# Patient Record
Sex: Male | Born: 1935
Health system: Southern US, Community
[De-identification: ages and names within clinical notes are randomized; demographics above are authoritative.]

## PROBLEM LIST (undated history)

## (undated) DIAGNOSIS — G4700 Insomnia, unspecified: Secondary | ICD-10-CM

## (undated) DIAGNOSIS — G4733 Obstructive sleep apnea (adult) (pediatric): Secondary | ICD-10-CM

## (undated) DIAGNOSIS — Z7901 Long term (current) use of anticoagulants: Secondary | ICD-10-CM

## (undated) DIAGNOSIS — J189 Pneumonia, unspecified organism: Secondary | ICD-10-CM

## (undated) DIAGNOSIS — K635 Polyp of colon: Secondary | ICD-10-CM

## (undated) DIAGNOSIS — I10 Essential (primary) hypertension: Secondary | ICD-10-CM

## (undated) DIAGNOSIS — K649 Unspecified hemorrhoids: Secondary | ICD-10-CM

## (undated) DIAGNOSIS — E785 Hyperlipidemia, unspecified: Secondary | ICD-10-CM

## (undated) DIAGNOSIS — R0600 Dyspnea, unspecified: Secondary | ICD-10-CM

## (undated) DIAGNOSIS — I482 Chronic atrial fibrillation, unspecified: Secondary | ICD-10-CM

## (undated) DIAGNOSIS — J449 Chronic obstructive pulmonary disease, unspecified: Secondary | ICD-10-CM

## (undated) DIAGNOSIS — R609 Edema, unspecified: Secondary | ICD-10-CM

## (undated) DIAGNOSIS — N1832 Chronic kidney disease, stage 3b: Secondary | ICD-10-CM

## (undated) DIAGNOSIS — G4730 Sleep apnea, unspecified: Secondary | ICD-10-CM

## (undated) DIAGNOSIS — K219 Gastro-esophageal reflux disease without esophagitis: Secondary | ICD-10-CM

## (undated) DIAGNOSIS — M109 Gout, unspecified: Secondary | ICD-10-CM

## (undated) DIAGNOSIS — F329 Major depressive disorder, single episode, unspecified: Secondary | ICD-10-CM

## (undated) DIAGNOSIS — F32A Depression, unspecified: Secondary | ICD-10-CM

## (undated) DIAGNOSIS — I495 Sick sinus syndrome: Secondary | ICD-10-CM

## (undated) HISTORY — DX: Gout, unspecified: M10.9

## (undated) HISTORY — DX: Insomnia, unspecified: G47.00

## (undated) HISTORY — DX: Chronic obstructive pulmonary disease, unspecified: J44.9

## (undated) HISTORY — PX: CHOLECYSTECTOMY: SHX55

## (undated) HISTORY — DX: Major depressive disorder, single episode, unspecified: F32.9

## (undated) HISTORY — DX: Polyp of colon: K63.5

## (undated) HISTORY — DX: Hyperlipidemia, unspecified: E78.5

## (undated) HISTORY — DX: Chronic atrial fibrillation, unspecified: I48.20

## (undated) HISTORY — PX: NASAL SINUS SURGERY: SHX719

## (undated) HISTORY — DX: Sick sinus syndrome: I49.5

## (undated) HISTORY — DX: Sleep apnea, unspecified: G47.30

## (undated) HISTORY — PX: COLONOSCOPY: SHX174

## (undated) HISTORY — DX: Depression, unspecified: F32.A

## (undated) HISTORY — PX: CATARACT EXTRACTION, BILATERAL: SHX1313

## (undated) HISTORY — DX: Edema, unspecified: R60.9

## (undated) HISTORY — DX: Essential (primary) hypertension: I10

---

## 2004-01-09 ENCOUNTER — Ambulatory Visit: Payer: Self-pay

## 2004-08-19 ENCOUNTER — Other Ambulatory Visit: Payer: Self-pay

## 2004-08-19 ENCOUNTER — Inpatient Hospital Stay: Payer: Self-pay | Admitting: Cardiology

## 2004-08-20 ENCOUNTER — Other Ambulatory Visit: Payer: Self-pay

## 2004-08-21 HISTORY — PX: CARDIOVERSION: SHX1299

## 2004-12-19 ENCOUNTER — Ambulatory Visit: Payer: Self-pay | Admitting: Internal Medicine

## 2005-01-30 ENCOUNTER — Ambulatory Visit: Payer: Self-pay | Admitting: Family Medicine

## 2005-06-26 ENCOUNTER — Ambulatory Visit: Payer: Self-pay | Admitting: Cardiology

## 2005-07-03 ENCOUNTER — Ambulatory Visit: Payer: Self-pay | Admitting: Otolaryngology

## 2006-10-17 ENCOUNTER — Ambulatory Visit: Payer: Self-pay | Admitting: Family Medicine

## 2006-12-09 ENCOUNTER — Ambulatory Visit: Payer: Self-pay | Admitting: Unknown Physician Specialty

## 2006-12-27 ENCOUNTER — Ambulatory Visit: Payer: Self-pay | Admitting: Unknown Physician Specialty

## 2007-01-02 ENCOUNTER — Ambulatory Visit: Payer: Self-pay | Admitting: Family Medicine

## 2007-04-14 ENCOUNTER — Ambulatory Visit: Payer: Self-pay | Admitting: Urology

## 2007-12-15 IMAGING — CT CT ABD-PELV W/ CM
1 of 3 series · 13 of 32 positions shown, 19 images · IV contrast (agent unspecified)
Comparison: none

REASON FOR EXAM: abd pain  epigastric   dysphagia
COMMENTS:

PROCEDURE:     CT  - CT ABDOMEN / PELVIS  W  - December 09, 2006  [DATE]
RESULT:     Comparison: No available comparison exam.
TECHNIQUE: CT examination of the abdomen and pelvis was performed after
intravenous administration of 85 cc of [id number] nonionic contrast in
addition to oral contrast. Collimation is 8 mm. Delayed postcontrast images
of the abdomen were also obtained.

[Series 2: soft tissue · axial · 0.76mm/px · z∈[+483,+891]mm · 13 of 59 slices shown, 19 images]
[im 4/59  soft-tissue]
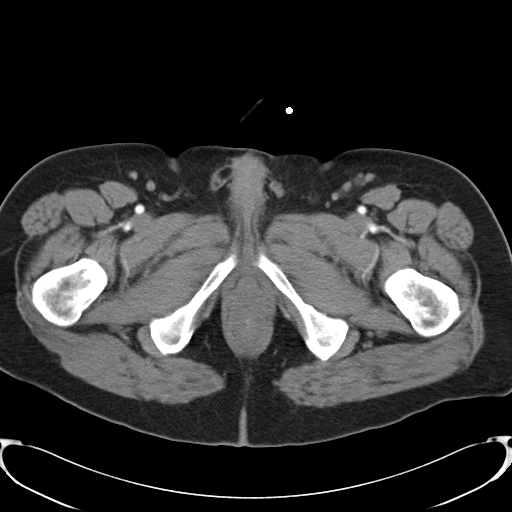
[im 4/59  bone]
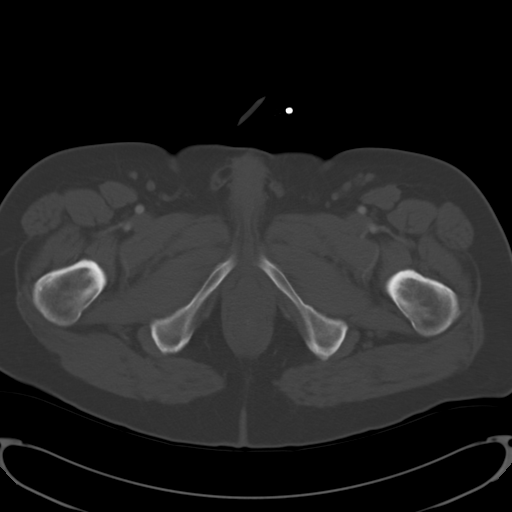
[im 8/59  soft-tissue]
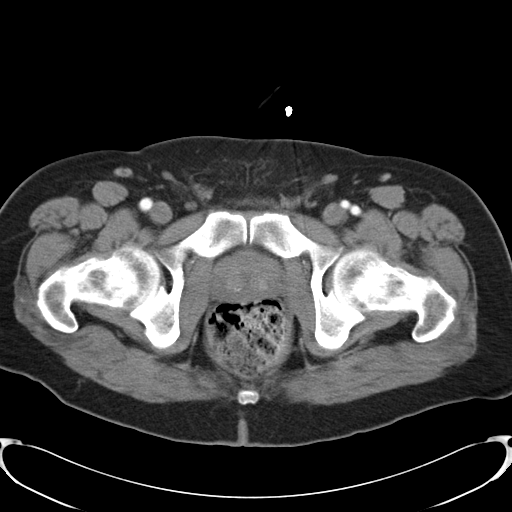
[im 12/59  soft-tissue]
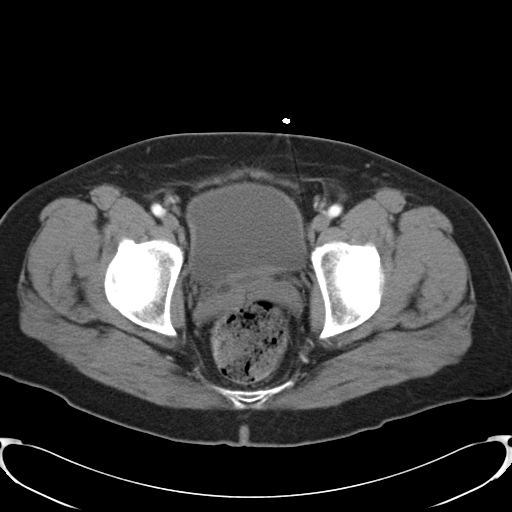
[im 16/59  soft-tissue]
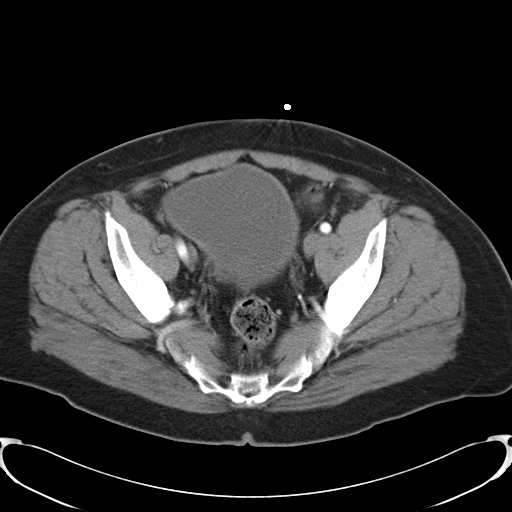
[im 20/59  soft-tissue]
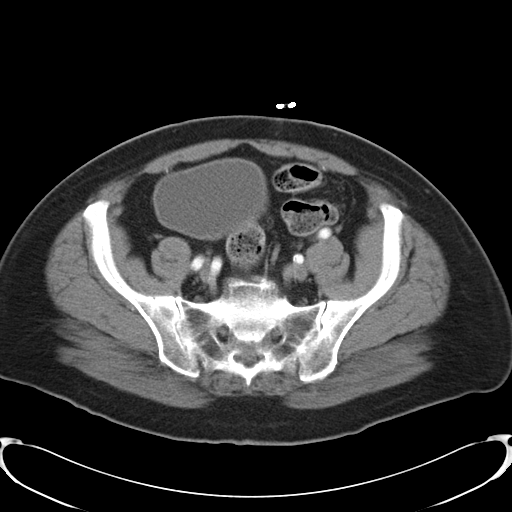
[im 24/59  soft-tissue]
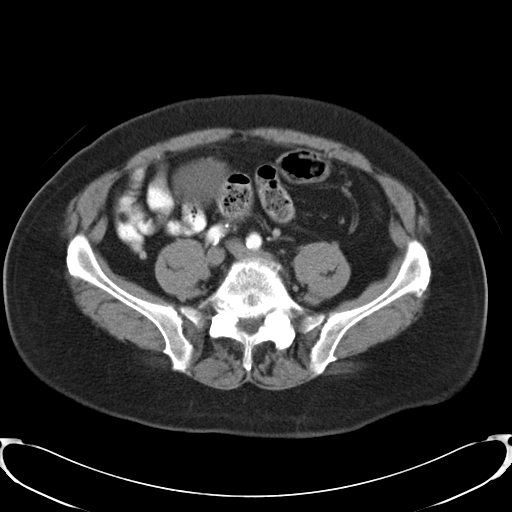
[im 31/59  soft-tissue]
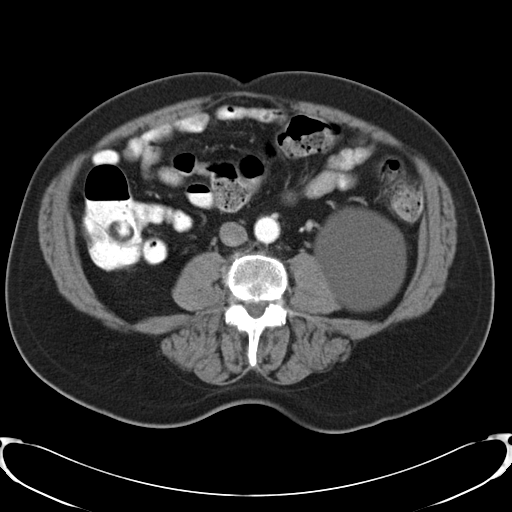
[im 35/59  soft-tissue]
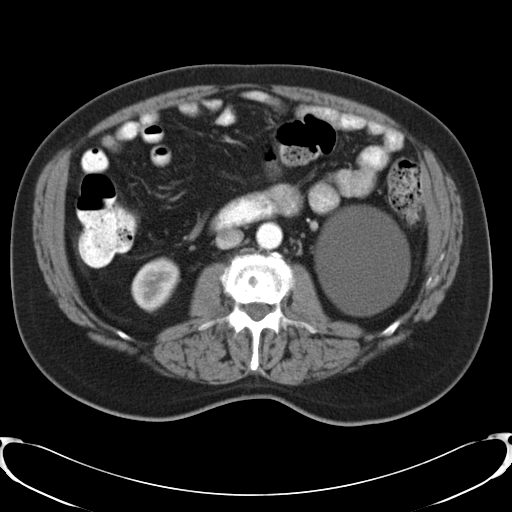
[im 39/59  soft-tissue]
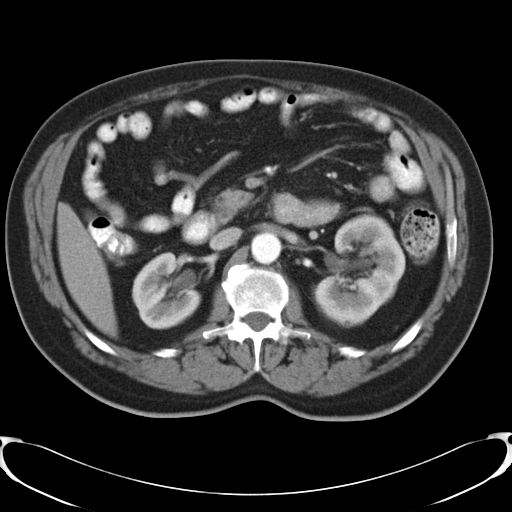
[im 39/59  bone]
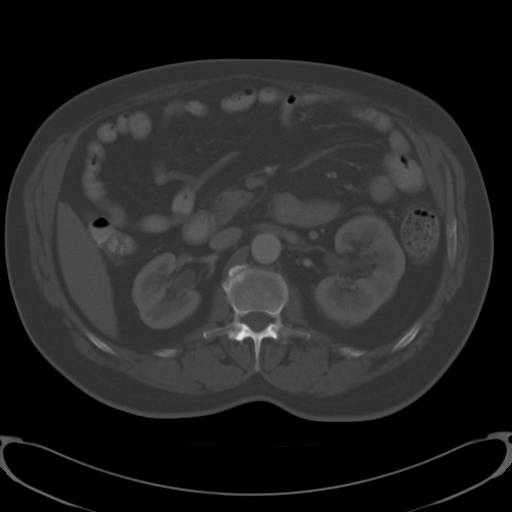
[im 43/59  soft-tissue]
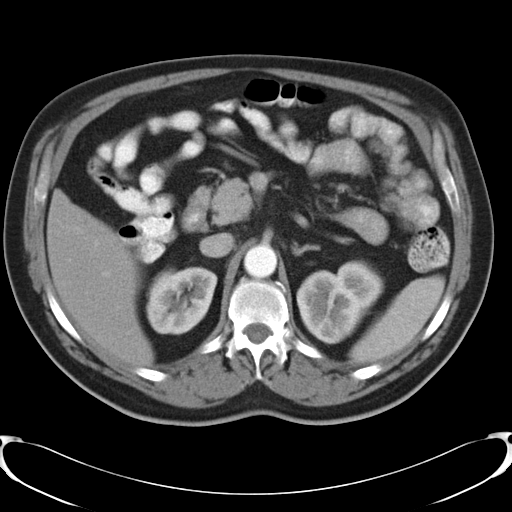
[im 43/59  lung]
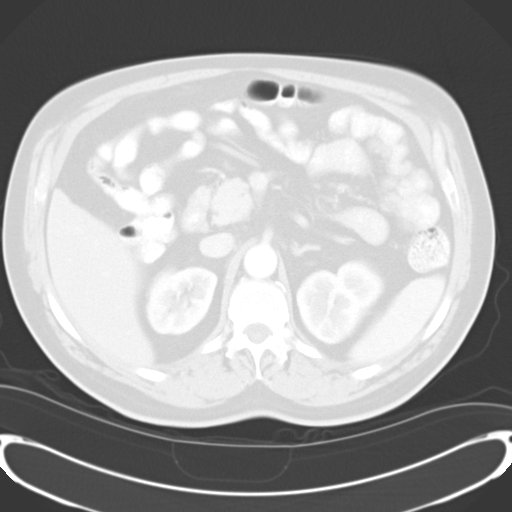
[im 47/59  soft-tissue]
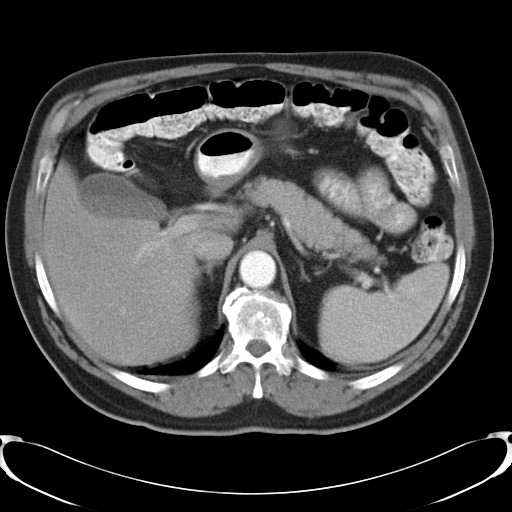
[im 47/59  lung]
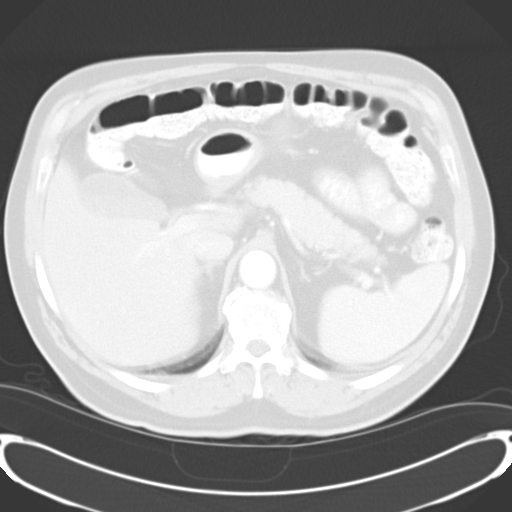
[im 51/59  soft-tissue]
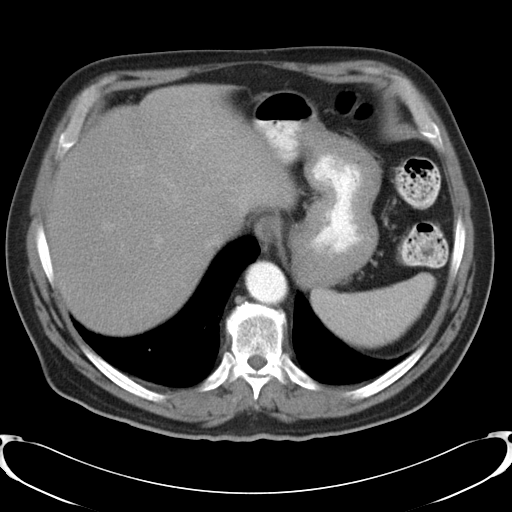
[im 51/59  lung]
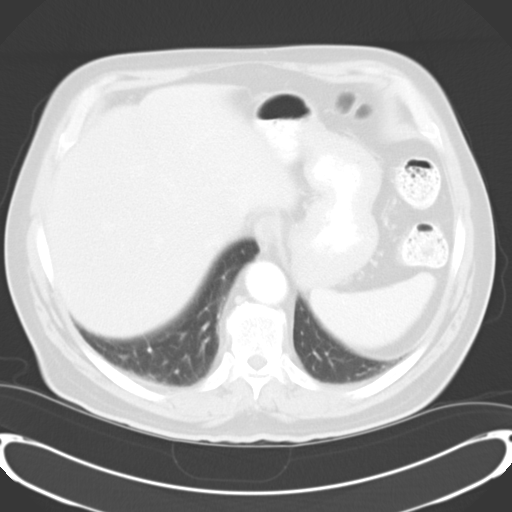
[im 55/59  soft-tissue]
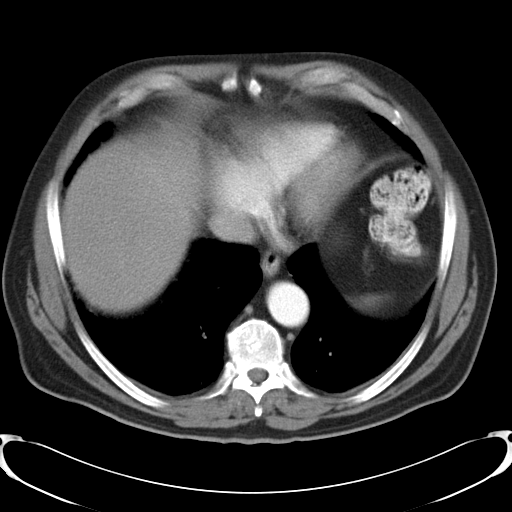
[im 55/59  lung]
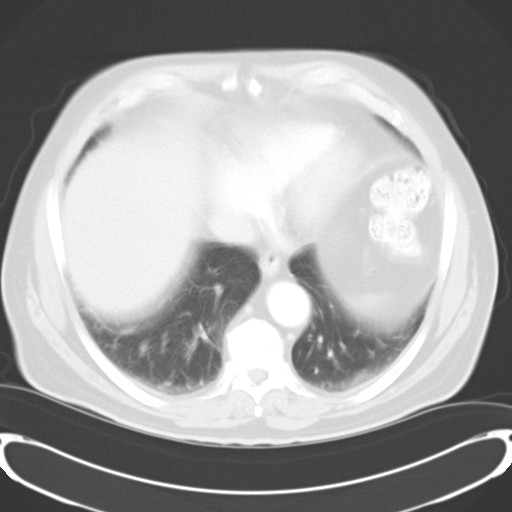

[13 of 32 positions shown; findings below may reference images not displayed]

FINDINGS: Limited evaluation of the lung bases is unremarkable

The liver, spleen, pancreas, adrenal glands, and right kidney are
unremarkable. Sludge is seen within the gallbladder. An 8.5 cm left inferior
pole exophytic renal cyst is noted.

There is no dilatation or definite wall thickening of the  bowels. The
appendix is unremarkable. There is no significant intra abdominal/pelvic fat
stranding. There is no intraperitoneal free air. There is no significant
free fluid. There are no enlarged abdominal pelvic lymph nodes.

Degenerative changes are seen involving the lower lumbar spine.
IMPRESSION: 1. An 8.5 cm left inferior pole exophytic renal cyst is noted.
2. Gallbladder sludge.

## 2008-03-09 ENCOUNTER — Ambulatory Visit: Payer: Self-pay | Admitting: Unknown Physician Specialty

## 2008-03-25 ENCOUNTER — Ambulatory Visit: Payer: Self-pay | Admitting: Surgery

## 2009-03-15 IMAGING — US ABDOMEN ULTRASOUND
1 series · 17 of 25 positions shown · non-contrast
Comparison: none

REASON FOR EXAM: abnormal LFT's
COMMENTS:

[Series 1: abdomen ultrasound · 17 of 74 slices shown]
[im 1/74]
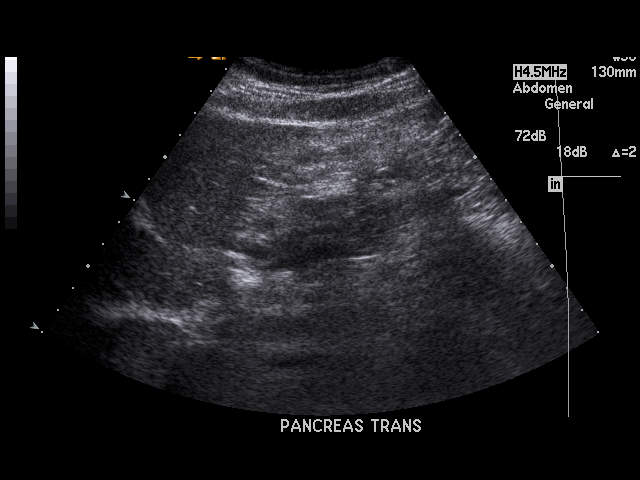
[im 7/74]
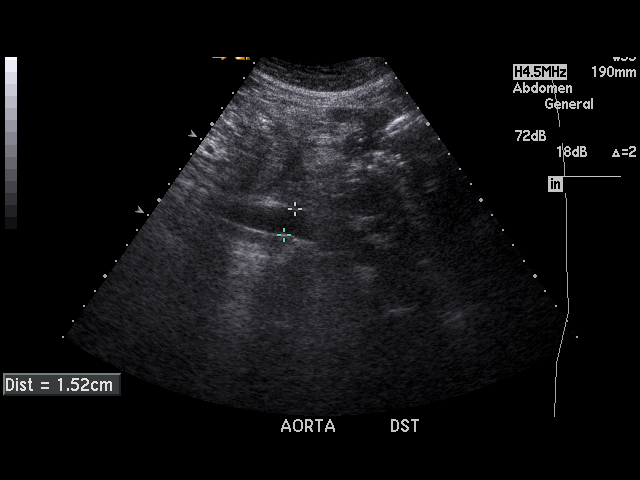
[im 10/74]
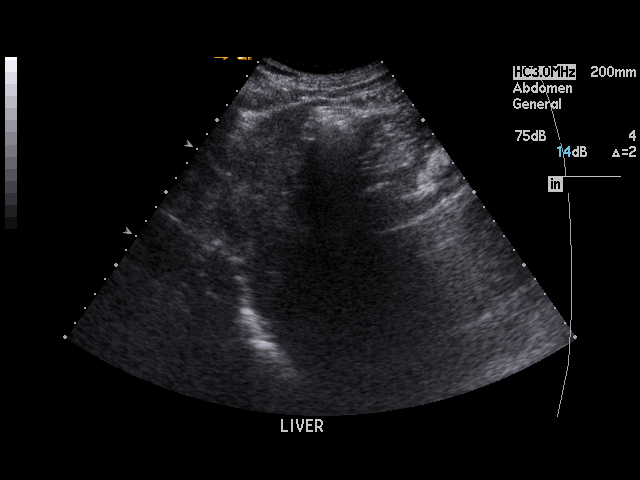
[im 16/74]
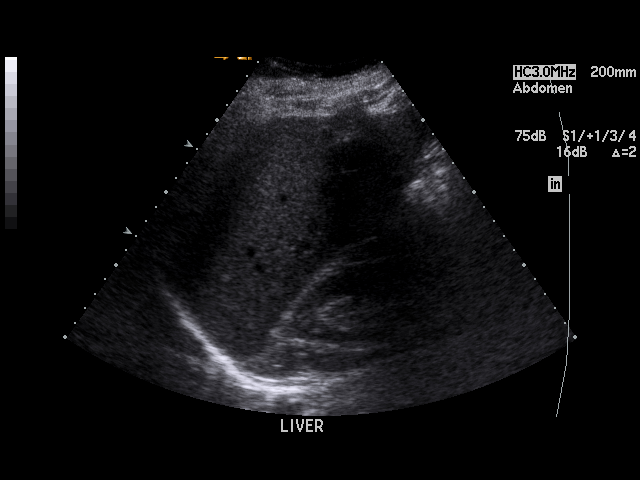
[im 19/74]
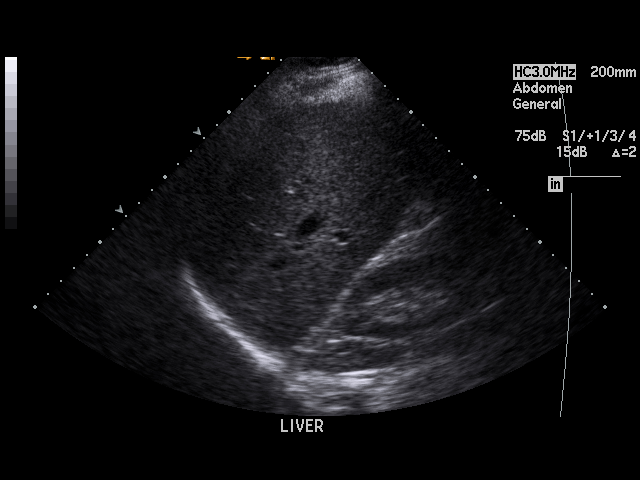
[im 25/74]
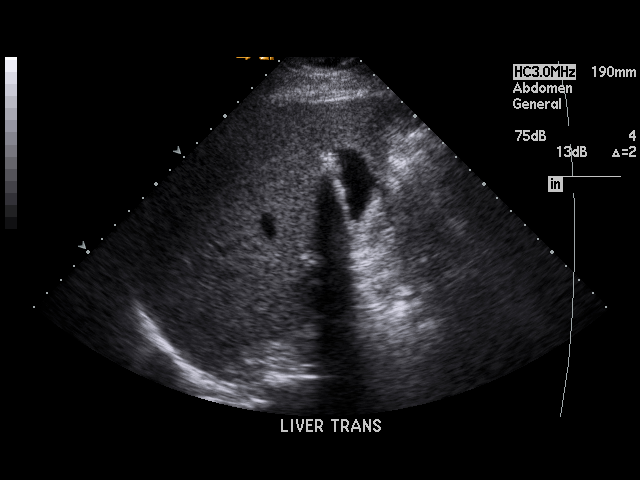
[im 28/74]
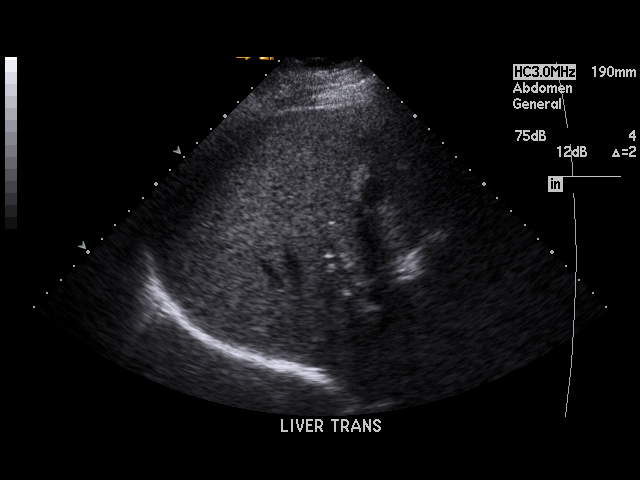
[im 34/74]
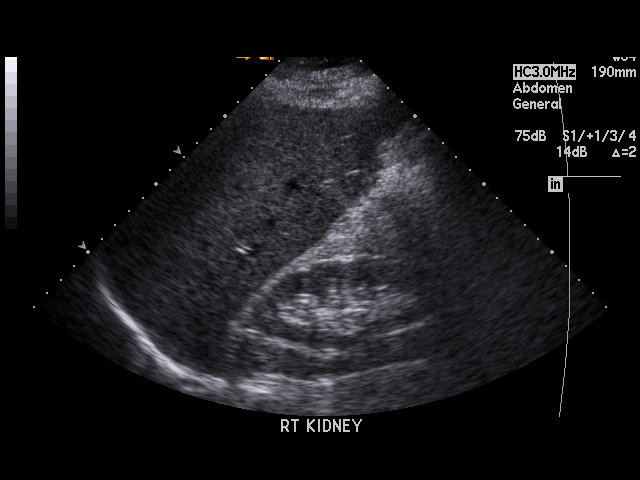
[im 37/74]
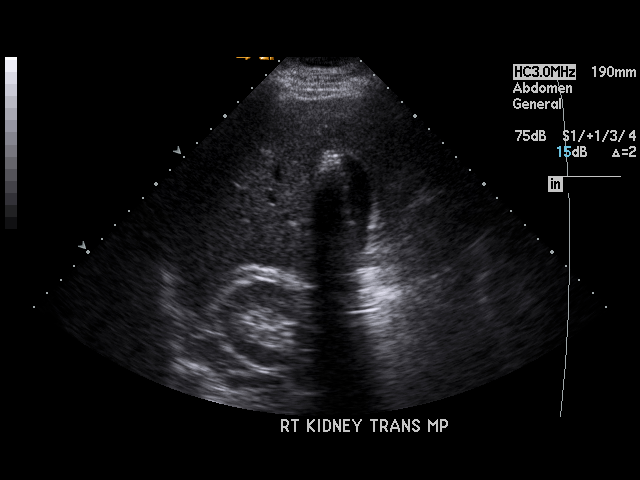
[im 40/74]
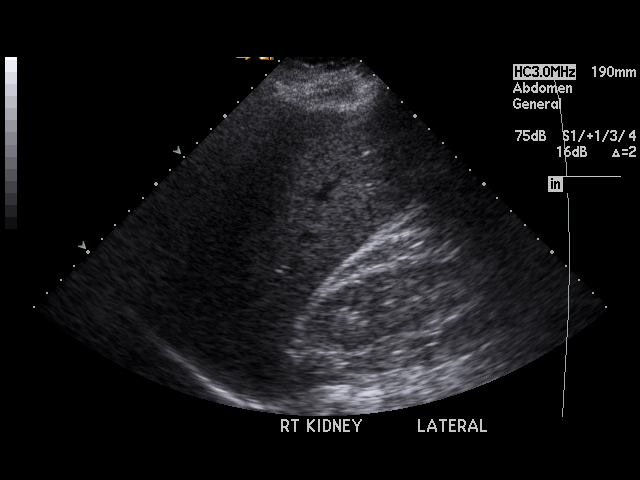
[im 46/74]
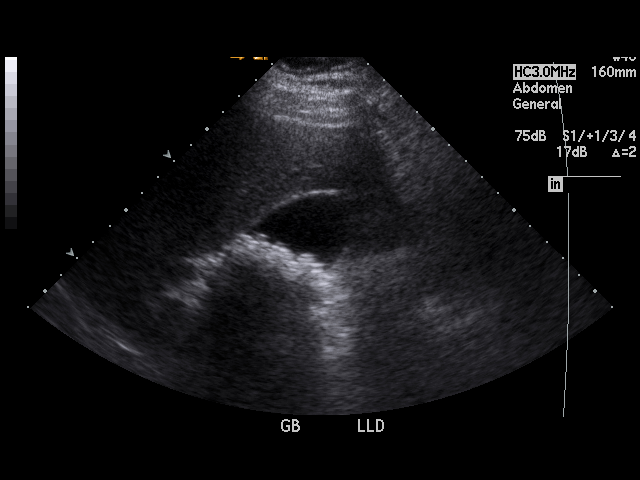
[im 49/74]
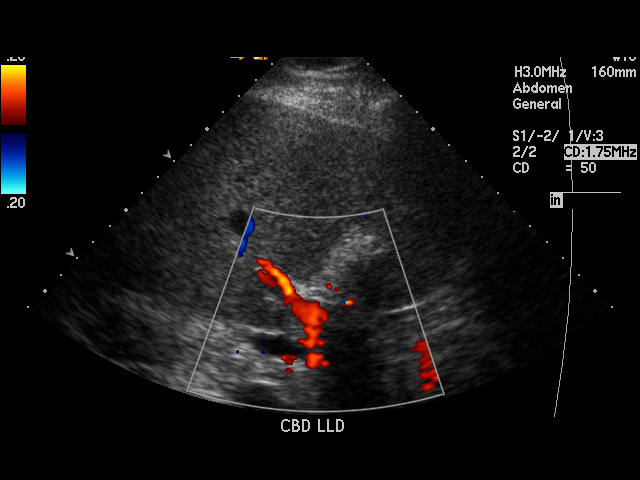
[im 55/74]
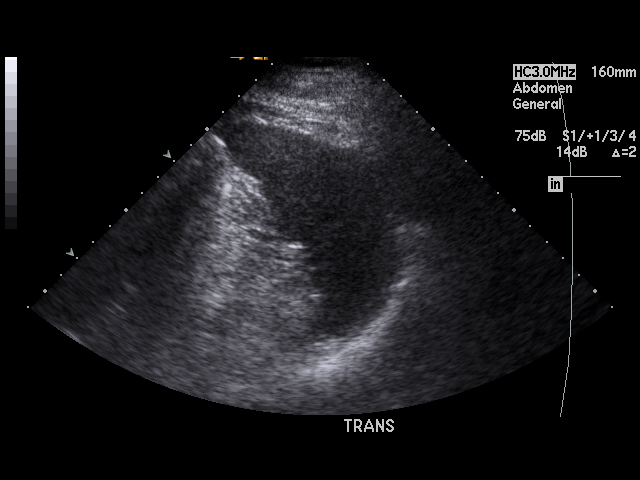
[im 58/74]
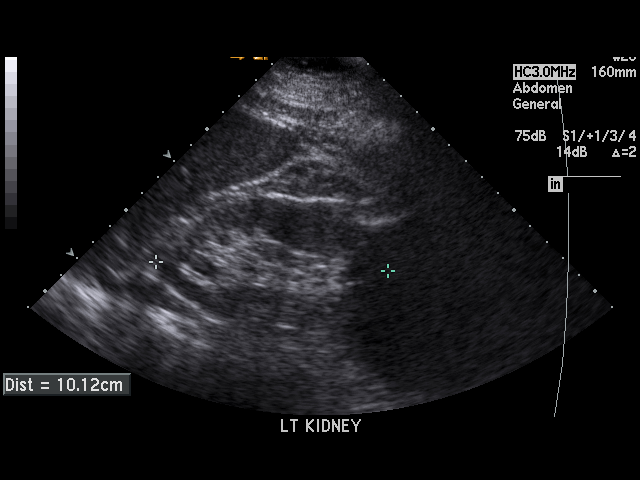
[im 64/74]
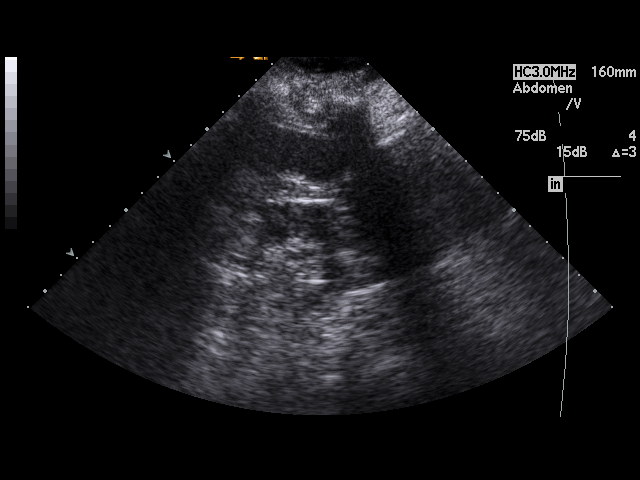
[im 67/74]
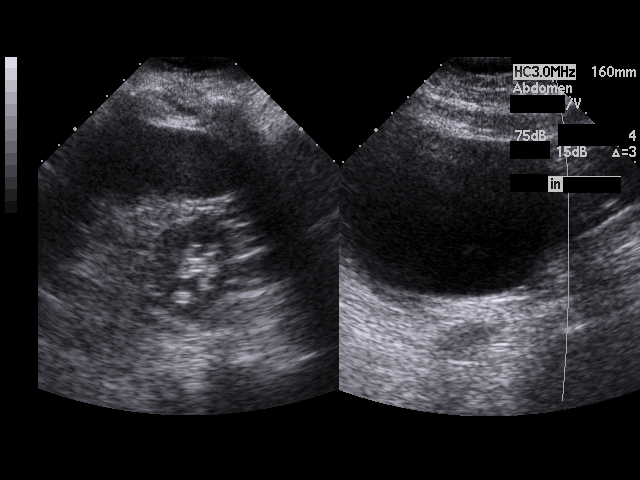
[im 74/74]
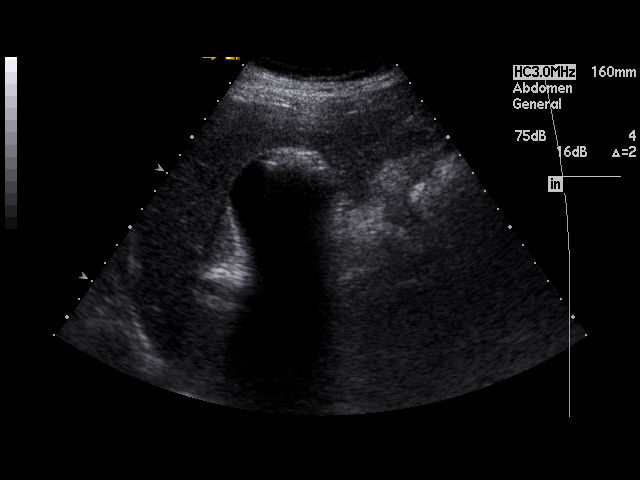

[17 of 25 positions shown; findings below may reference images not displayed]

PROCEDURE:     US  - US ABDOMEN GENERAL SURVEY  - March 09, 2008  [DATE]

RESULT:     The liver and abdominal aorta shows no significant
abnormalities. The inferior vena cava and pancreas are for the most part
obscured. The spleen is prominent but within the limits of normal for size.
Spleen measures 12.39 cm in AP diameter.  There are noted multiple mobile
echo densities in the gallbladder compatible with gallstones. There is no
thickening of the gallbladder wall. The common bile duct measures 2.8 mm in
diameter which is within normal limits. The kidneys show no hydronephrosis.
There is a 7.86 cm cyst of the lower pole of the LEFT kidney. No ascites is
seen.
IMPRESSION: 1. Cholelithiasis with there being multiple mobile gallstones that fill
approximately 30 to 40% of the gallbladder.
2. There is no thickening of the gallbladder wall.
3. The pancreas and inferior vena cava are not visualized adequately for
evaluation on this exam.
4. LEFT renal cyst.

## 2009-08-25 ENCOUNTER — Other Ambulatory Visit: Payer: Self-pay | Admitting: Family Medicine

## 2009-10-03 DIAGNOSIS — D099 Carcinoma in situ, unspecified: Secondary | ICD-10-CM

## 2009-10-03 DIAGNOSIS — C44321 Squamous cell carcinoma of skin of nose: Secondary | ICD-10-CM

## 2009-10-03 HISTORY — DX: Squamous cell carcinoma of skin of nose: C44.321

## 2009-10-03 HISTORY — DX: Carcinoma in situ, unspecified: D09.9

## 2010-09-06 ENCOUNTER — Ambulatory Visit: Payer: Self-pay | Admitting: Internal Medicine

## 2010-09-10 ENCOUNTER — Other Ambulatory Visit: Payer: Self-pay | Admitting: Internal Medicine

## 2012-08-07 ENCOUNTER — Ambulatory Visit: Payer: Self-pay | Admitting: Otolaryngology

## 2012-08-17 ENCOUNTER — Encounter: Payer: Self-pay | Admitting: Pulmonary Disease

## 2012-08-17 ENCOUNTER — Ambulatory Visit (INDEPENDENT_AMBULATORY_CARE_PROVIDER_SITE_OTHER): Payer: Medicare Other | Admitting: Pulmonary Disease

## 2012-08-17 VITALS — BP 126/82 | HR 74 | Temp 98.8°F | Ht 67.0 in | Wt 225.0 lb

## 2012-08-17 DIAGNOSIS — J189 Pneumonia, unspecified organism: Secondary | ICD-10-CM

## 2012-08-17 DIAGNOSIS — R0602 Shortness of breath: Secondary | ICD-10-CM

## 2012-08-17 NOTE — Patient Instructions (Addendum)
Your lung function test did not show COPD.  The test was normal, this is good  We will repeat the Chest X-ray on May 20th.  Call us after the test so we can get the results.  If the pneumonia has cleared up then we won't need to see you again unless your symptoms get worse.

## 2012-08-17 NOTE — Assessment & Plan Note (Addendum)
The most likely explanation for Craig Gilmore's dyspnea, cough, fever, CXR abnormalities, and resolution with Levaquin but not amoxicillin is atypical pneumonia with an organism like mycoplasma.  Fortunately he has a normal lung exam, no evidence of pneumonia, and feels much better.  I explained to him that the ddx of his original presentation included pulmonary edema versus an inflammatory pneumonia. However I think the most likely explanation based on his clinical history and response to therapy as an atypical pneumonia.  Plan: -Repeat a chest x-ray in 2 weeks to ensure resolution of disease -If he remains asymptomatic and his chest x-rays cleared and there is no need for further followup with me.

## 2012-08-17 NOTE — Progress Notes (Signed)
Subjective:    Patient ID: Craig Gilmore, male    DOB: 12-06-1934, 78 y.o.   MRN: [id number]  HPI  78 year old male who comes to our clinic today to establish care for shortness of breath. Several weeks ago he developed sinus congestion and slight increasing shortness of breath. After persistence for many days on end he went to his primary care physician where he was prescribed Augmentin. He took this for 10 days and he initially had a slight improvement but then his symptoms worsen towards the end of the course. By that point he had increasing shortness of breath and cough. He initially had some fevers and felt hot and said he was "sweating bullets". He went to an urgent care facility 1 day after completing the amoxicillin had a chest x-ray performed which showed diffuse interstitial infiltrates. He was prescribed Levaquin and prednisone and had immediate resolution of his symptoms. He is since completed these therapies and he says now he continues to feel wonderful and has no shortness of breath. Throughout this illness he never had chest pain or leg swelling. 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 Ever since then he has had some mild shortness of breath with exertion but it is never really interfered with his activity level. He previously smoked one to one half pack of cigarettes daily for 40 years and quit 22 years ago. In the past he saw Dr. Deboraha Sprang with pulmonary medicine in the [facility] clinic but he is unaware of any diagnosis he may of been given other than sleep apnea. He uses CPAP on a regular basis. He also has atrial fibrillation and follows with cardiology here in town.   Past Medical History  Diagnosis Date  . Hypertension   . Hyperlipidemia   . Insomnia   . Depression      Family History  Problem Relation Age of Onset  . Diabetes Mother   . Heart disease Father      History   Social History  . Marital Status: Married    Spouse Name: N/A    Number of Children: N/A  . Years of Education: N/A   Occupational History  . Not on file.   Social History Main Topics  . Smoking status: Former Smoker -- 1.50 packs/day for 50 years    Types: Cigarettes    Quit date: 04/08/1989  . Smokeless tobacco: Never Used  . Alcohol Use: No  . Drug Use: No  . Sexually Active: Not on file   Other Topics Concern  . Not on file   Social History Narrative  . No narrative on file     Allergies  Allergen Reactions  . Pravastatin      No outpatient prescriptions prior to visit.   No facility-administered medications prior to visit.      Review of Systems  Constitutional: Negative for fever and unexpected weight change.  HENT: Positive for congestion, sore throat, rhinorrhea, sneezing and postnasal drip. Negative for ear pain, nosebleeds, trouble swallowing, dental problem and sinus pressure.   Eyes: Negative for redness and itching.  Respiratory: Positive for cough, shortness of breath and wheezing. Negative for chest tightness.   Cardiovascular: Negative for palpitations and leg swelling.  Gastrointestinal: Negative for nausea and vomiting.  Genitourinary: Negative for dysuria.  Musculoskeletal: Negative for joint swelling.  Skin: Negative for rash.  Neurological: Positive for headaches.  Hematological: Does not bruise/bleed easily.  Psychiatric/Behavioral: Negative for dysphoric mood. The patient is not nervous/anxious.        Objective:   Physical Exam  Filed Vitals:   08/17/12 1443  BP: 126/82  Pulse: 74  Temp: 98.8 F (37.1 C)  TempSrc: Oral  Height: 5\' 7"  (1.702 m)  Weight: 225 lb (102.059 kg)  SpO2: 95%  RA  Gen: well appearing, no acute distress HEENT: NCAT, PERRL, EOMi, OP clear, neck supple without masses PULM: CTA B CV: RRR, no mgr, no JVD AB: BS+, soft, nontender, no hsm Ext: warm, no edema, no clubbing, no cyanosis Derm: no rash or skin breakdown Neuro: A&Ox4, CN II-XII intact,  strength 5/5 in all 4 extremities  08/07/2012 CXR > diffuse interstitial infiltrate, favor pulmonary edema, no pleural effusion in the     Assessment & Plan:   Pneumonia, organism unspecified The most likely explanation for [name]'s dyspnea, cough, fever, CXR abnormalities, and resolution with Levaquin but not amoxicillin is atypical pneumonia with an organism like mycoplasma.  Fortunately he has a normal lung exam, no evidence of pneumonia, and feels much better.  I explained to him that the ddx of his original presentation included pulmonary edema versus an inflammatory pneumonia. However I think the most likely explanation based on his clinical history and response to therapy as an atypical pneumonia.  Plan: -Repeat a chest x-ray in 2 weeks to ensure resolution of disease -If he remains asymptomatic and his chest x-rays cleared and there is no need for further followup with me.    Updated Medication List Outpatient Encounter Prescriptions as of 08/17/2012  Medication Sig Dispense Refill  . allopurinol (ZYLOPRIM) 300 MG tablet Take 300 mg by mouth daily.      Marland Kitchen buPROPion (WELLBUTRIN XL) 150 MG 24 hr tablet Take 75 mg by mouth daily.      . Choline Fenofibrate (TRILIPIX) 135 MG capsule Take 135 mg by mouth daily.      Marland Kitchen glucosamine-chondroitin 500-400 MG tablet Take 1 tablet by mouth daily.      . sertraline (ZOLOFT) 100 MG tablet Take 150 mg by mouth daily.      Marland Kitchen warfarin (COUMADIN) 2.5 MG tablet Take 2.5 mg by mouth as directed.      . warfarin (COUMADIN) 3 MG tablet Take 3 mg by mouth as directed.      . zolpidem (AMBIEN) 10 MG tablet Take 10 mg by mouth at bedtime as needed for sleep.       No facility-administered encounter medications on file as of 08/17/2012.

## 2012-08-25 ENCOUNTER — Ambulatory Visit: Payer: Self-pay | Admitting: Pulmonary Disease

## 2012-09-01 ENCOUNTER — Encounter: Payer: Self-pay | Admitting: Pulmonary Disease

## 2012-09-02 ENCOUNTER — Telehealth: Payer: Self-pay | Admitting: *Deleted

## 2012-09-02 NOTE — Telephone Encounter (Signed)
Message copied by Christen Butter on Wed Sep 02, 2012 10:13 AM ------      Message from: Lupita Leash      Created: Tue Sep 01, 2012  5:31 PM       L,            Please let him know that his CXR was OK according to the radiologist.  He should plan on keeping his follow up appointment with me.            Thanks,      B ------

## 2012-09-02 NOTE — Telephone Encounter (Signed)
Spoke with pt and notified of results per Dr.McQuaid. Pt verbalized understanding and denied any questions. 

## 2012-10-06 ENCOUNTER — Encounter: Payer: Self-pay | Admitting: Pulmonary Disease

## 2013-11-08 ENCOUNTER — Encounter: Payer: Self-pay | Admitting: *Deleted

## 2013-11-16 ENCOUNTER — Ambulatory Visit: Payer: Medicare Other | Admitting: Cardiovascular Disease

## 2013-11-23 ENCOUNTER — Encounter (INDEPENDENT_AMBULATORY_CARE_PROVIDER_SITE_OTHER): Payer: Self-pay

## 2013-11-23 ENCOUNTER — Encounter: Payer: Self-pay | Admitting: Cardiovascular Disease

## 2013-11-23 ENCOUNTER — Ambulatory Visit (INDEPENDENT_AMBULATORY_CARE_PROVIDER_SITE_OTHER): Payer: Medicare Other | Admitting: Cardiovascular Disease

## 2013-11-23 VITALS — BP 154/95 | HR 80 | Ht 67.0 in | Wt 212.0 lb

## 2013-11-23 DIAGNOSIS — R0989 Other specified symptoms and signs involving the circulatory and respiratory systems: Secondary | ICD-10-CM

## 2013-11-23 DIAGNOSIS — I4891 Unspecified atrial fibrillation: Secondary | ICD-10-CM

## 2013-11-23 DIAGNOSIS — R0609 Other forms of dyspnea: Secondary | ICD-10-CM

## 2013-11-23 DIAGNOSIS — I482 Chronic atrial fibrillation, unspecified: Secondary | ICD-10-CM | POA: Insufficient documentation

## 2013-11-23 NOTE — Patient Instructions (Signed)
You have been referred to our anticoagulation clinic. Please make appointment for tomorrow if possible.   Your physician has recommended that you wear a 24 hour holter monitor. Holter monitors are medical devices that record the heart's electrical activity. Doctors most often use these monitors to diagnose arrhythmias. Arrhythmias are problems with the speed or rhythm of the heartbeat. The monitor is a small, portable device. You can wear one while you do your normal daily activities. This is usually used to diagnose what is causing palpitations/syncope (passing out).  Your physician wants you to follow-up in: 6 months. You will receive a reminder letter in the mail two months in advance. If you don't receive a letter, please call our office to schedule the follow-up appointment.

## 2013-11-23 NOTE — Assessment & Plan Note (Signed)
The patient has chronic atrial fibrillation with previous bradycardia and thus he is not on rate controlling medications. He is on long-term anticoagulation with warfarin. I referred him to our anticoagulation clinic. He complains of significant exertional dyspnea. It is possible that he is having significant rapid ventricular response with activities as was  evident on his stress test from last year. Thus, I requested a 24-hour Holter monitor. I might consider adding small dose metoprolol.

## 2013-11-23 NOTE — Assessment & Plan Note (Signed)
The patient reports prolonged history of exertional dyspnea with minimal activities. Check optimal atrial fibrillation control as outlined above. I will consider an echocardiogram given that he has not had one since 2012.

## 2013-11-23 NOTE — Progress Notes (Signed)
HPI  This is a 79 year old man who is here to transfer cardiovascular care. He used to follow with Roderic Palau and Dr. Nehemiah Massed. He has prolonged history of chronic atrial fibrillation on long-term anticoagulation with warfarin. There is also reported early sick sinus syndrome with occasional bradycardia. He has not been on any rate controlling medications. He is a previous smoker with known COPD. He also has sleep apnea treated with CPAP. Family history is remarkable for coronary artery disease but not prematurely. Most recent treadmill stress test was in February of 2014 which was negative for ischemia. He was able to exercise for 4 minutes and heart rate jumped quickly to 193 beats per minute. Holter monitor in February of 2014 showed atrial fibrillation with a maximum heart rate of 141 and average heart rate of 74 beats per minute. Echocardiogram in 2012 showed normal LV systolic function with moderate mitral tricuspid regurgitation.  Allergies  Allergen Reactions  . Baycol [Cerivastatin]   . Pravastatin      Current Outpatient Prescriptions on File Prior to Visit  Medication Sig Dispense Refill  . allopurinol (ZYLOPRIM) 300 MG tablet Take 300 mg by mouth daily.      Marland Kitchen buPROPion (WELLBUTRIN XL) 150 MG 24 hr tablet Take 75 mg by mouth daily.      . Choline Fenofibrate (TRILIPIX) 135 MG capsule Take 135 mg by mouth daily.      . sertraline (ZOLOFT) 100 MG tablet Take 150 mg by mouth daily.      Marland Kitchen warfarin (COUMADIN) 2.5 MG tablet Take 2.5 mg by mouth as directed.      . warfarin (COUMADIN) 3 MG tablet Take 3 mg by mouth as directed.      . zolpidem (AMBIEN) 10 MG tablet Take 10 mg by mouth at bedtime as needed for sleep.       No current facility-administered medications on file prior to visit.     Past Medical History  Diagnosis Date  . Hypertension   . Hyperlipidemia   . Insomnia   . Depression   . Chronic atrial fibrillation   . Sick sinus syndrome   . Severe sleep apnea     . Colonic polyp   . Edema   . COPD (chronic obstructive pulmonary disease)   . Gout      Past Surgical History  Procedure Laterality Date  . Nasal sinus surgery    . Cholecystectomy       Family History  Problem Relation Age of Onset  . Diabetes Mother   . Heart disease Mother   . Heart disease Father   . Aneurysm Father      History   Social History  . Marital Status: Married    Spouse Name: N/A    Number of Children: N/A  . Years of Education: N/A   Occupational History  . Not on file.   Social History Main Topics  . Smoking status: Former Smoker -- 1.50 packs/day for 50 years    Types: Cigarettes    Quit date: 04/08/1989  . Smokeless tobacco: Never Used  . Alcohol Use: No  . Drug Use: No  . Sexual Activity: Not on file   Other Topics Concern  . Not on file   Social History Narrative  . No narrative on file     ROS A 10 point review of system was performed. It is negative other than that mentioned in the history of present illness.   PHYSICAL EXAM   BP  154/95  Pulse 80  Ht 5\' 7"  (1.702 m)  Wt 212 lb (96.163 kg)  BMI 33.20 kg/m2 Constitutional: He is oriented to person, place, and time. He appears well-developed and well-nourished. No distress.  HENT: No nasal discharge.  Head: Normocephalic and atraumatic.  Eyes: Pupils are equal and round.  No discharge. Neck: Normal range of motion. Neck supple. No JVD present. No thyromegaly present.  Cardiovascular: Normal rate, irregular rhythm, normal heart sounds. Exam reveals no gallop and no friction rub. No murmur heard.  Pulmonary/Chest: Effort normal and breath sounds normal. No stridor. No respiratory distress. He has no wheezes. He has no rales. He exhibits no tenderness.  Abdominal: Soft. Bowel sounds are normal. He exhibits no distension. There is no tenderness. There is no rebound and no guarding.  Musculoskeletal: Normal range of motion. He exhibits no edema and no tenderness.   Neurological: He is alert and oriented to person, place, and time. Coordination normal.  Skin: Skin is warm and dry. No rash noted. He is not diaphoretic. No erythema. No pallor.  Psychiatric: He has a normal mood and affect. His behavior is normal. Judgment and thought content normal.       FYB:OFBPZW fibrillation  -irregular conduction  -Nonspecific ST depression  -Nondiagnostic.   ABNORMAL     ASSESSMENT AND PLAN

## 2013-11-24 ENCOUNTER — Ambulatory Visit (INDEPENDENT_AMBULATORY_CARE_PROVIDER_SITE_OTHER): Payer: Medicare Other

## 2013-11-24 DIAGNOSIS — I482 Chronic atrial fibrillation, unspecified: Secondary | ICD-10-CM

## 2013-11-24 DIAGNOSIS — Z5181 Encounter for therapeutic drug level monitoring: Secondary | ICD-10-CM

## 2013-11-24 DIAGNOSIS — I4891 Unspecified atrial fibrillation: Secondary | ICD-10-CM

## 2013-11-24 LAB — POCT INR: INR: 2.7

## 2013-11-26 ENCOUNTER — Telehealth: Payer: Self-pay

## 2013-11-26 NOTE — Telephone Encounter (Signed)
Pt states he has not received his monitor yet.

## 2013-11-26 NOTE — Telephone Encounter (Signed)
Spoke with Craig Gilmore and they have received holter monitor order but have not contacted patient as of now due to waiting on monitors. Pt will be contacted as soon as they have received them.

## 2013-12-01 DIAGNOSIS — I4891 Unspecified atrial fibrillation: Secondary | ICD-10-CM

## 2013-12-16 ENCOUNTER — Telehealth: Payer: Self-pay | Admitting: *Deleted

## 2013-12-16 NOTE — Telephone Encounter (Signed)
Informed patient per Dr. Fletcher Anon holter showed:  Chronic afib with reasonable rate control  Night time bradycardia  Continue same management

## 2013-12-22 ENCOUNTER — Other Ambulatory Visit: Payer: Self-pay

## 2013-12-22 ENCOUNTER — Ambulatory Visit (INDEPENDENT_AMBULATORY_CARE_PROVIDER_SITE_OTHER): Payer: Medicare Other

## 2013-12-22 ENCOUNTER — Ambulatory Visit (INDEPENDENT_AMBULATORY_CARE_PROVIDER_SITE_OTHER): Payer: Medicare Other | Admitting: *Deleted

## 2013-12-22 DIAGNOSIS — I4891 Unspecified atrial fibrillation: Secondary | ICD-10-CM

## 2013-12-22 DIAGNOSIS — I482 Chronic atrial fibrillation, unspecified: Secondary | ICD-10-CM

## 2013-12-22 DIAGNOSIS — Z5181 Encounter for therapeutic drug level monitoring: Secondary | ICD-10-CM

## 2013-12-22 LAB — POCT INR: INR: 2.6

## 2013-12-30 ENCOUNTER — Telehealth: Payer: Self-pay

## 2013-12-30 ENCOUNTER — Other Ambulatory Visit: Payer: Self-pay | Admitting: *Deleted

## 2013-12-30 MED ORDER — WARFARIN SODIUM 2.5 MG PO TABS: ORAL_TABLET | ORAL | Status: DC

## 2013-12-30 MED ORDER — WARFARIN SODIUM 3 MG PO TABS: ORAL_TABLET | ORAL | Status: DC

## 2013-12-30 MED ORDER — CHOLINE FENOFIBRATE 135 MG PO CPDR: 135.0000 mg | DELAYED_RELEASE_CAPSULE | Freq: Every day | ORAL | Status: DC

## 2013-12-30 NOTE — Telephone Encounter (Signed)
Pt wife called, pt is completely out of his Warfarin 3.0, would like a 14 day supply called to [facility]. And a full rx sent to [facility]. The Warfarin 2.5 called to [facility].

## 2013-12-30 NOTE — Telephone Encounter (Signed)
Please review for refill, Thank you. 

## 2014-01-19 ENCOUNTER — Ambulatory Visit (INDEPENDENT_AMBULATORY_CARE_PROVIDER_SITE_OTHER): Payer: Medicare Other

## 2014-01-19 DIAGNOSIS — I482 Chronic atrial fibrillation, unspecified: Secondary | ICD-10-CM

## 2014-01-19 DIAGNOSIS — Z5181 Encounter for therapeutic drug level monitoring: Secondary | ICD-10-CM

## 2014-01-19 LAB — POCT INR: INR: 2.8

## 2014-02-23 ENCOUNTER — Ambulatory Visit (INDEPENDENT_AMBULATORY_CARE_PROVIDER_SITE_OTHER): Payer: Medicare Other

## 2014-02-23 DIAGNOSIS — I482 Chronic atrial fibrillation, unspecified: Secondary | ICD-10-CM

## 2014-02-23 DIAGNOSIS — Z5181 Encounter for therapeutic drug level monitoring: Secondary | ICD-10-CM

## 2014-02-23 LAB — POCT INR: INR: 2.7

## 2014-04-06 ENCOUNTER — Ambulatory Visit (INDEPENDENT_AMBULATORY_CARE_PROVIDER_SITE_OTHER): Payer: Medicare Other

## 2014-04-06 DIAGNOSIS — I482 Chronic atrial fibrillation, unspecified: Secondary | ICD-10-CM

## 2014-04-06 DIAGNOSIS — Z5181 Encounter for therapeutic drug level monitoring: Secondary | ICD-10-CM

## 2014-04-06 LAB — POCT INR: INR: 2.3

## 2014-05-18 ENCOUNTER — Ambulatory Visit (INDEPENDENT_AMBULATORY_CARE_PROVIDER_SITE_OTHER): Payer: Medicare Other | Admitting: Pharmacist

## 2014-05-18 DIAGNOSIS — Z5181 Encounter for therapeutic drug level monitoring: Secondary | ICD-10-CM

## 2014-05-18 DIAGNOSIS — I482 Chronic atrial fibrillation, unspecified: Secondary | ICD-10-CM

## 2014-05-18 LAB — POCT INR: INR: 3.2

## 2014-05-24 ENCOUNTER — Encounter: Payer: Self-pay | Admitting: Cardiovascular Disease

## 2014-05-24 ENCOUNTER — Ambulatory Visit (INDEPENDENT_AMBULATORY_CARE_PROVIDER_SITE_OTHER): Payer: Medicare Other | Admitting: Cardiovascular Disease

## 2014-05-24 VITALS — BP 122/80 | HR 80 | Ht 67.0 in | Wt 214.2 lb

## 2014-05-24 DIAGNOSIS — I482 Chronic atrial fibrillation, unspecified: Secondary | ICD-10-CM

## 2014-05-24 NOTE — Patient Instructions (Signed)
Continue same medications.   Your physician wants you to follow-up in: 6 months.  You will receive a reminder letter in the mail two months in advance. If you don't receive a letter, please call our office to schedule the follow-up appointment.  

## 2014-05-24 NOTE — Assessment & Plan Note (Signed)
He is doing reasonably well with rate control. He is currently on long-term anticoagulation with warfarin. I discussed different alternatives for anticoagulation but he prefers to stay on warfarin at the present time.

## 2014-05-24 NOTE — Progress Notes (Signed)
HPI  This is a 80 year old man who is here for a follow-up visit regarding chronic atrial fibrillation.  There is also reported early sick sinus syndrome with occasional bradycardia. He has not been on any rate controlling medications. He is a previous smoker with known COPD. He also has sleep apnea treated with CPAP. Family history is remarkable for coronary artery disease but not prematurely. Most recent treadmill stress test was in February of 2014 which was negative for ischemia. He was able to exercise for 4 minutes and heart rate jumped quickly to 193 beats per minute. Holter monitor in February of 2014 showed atrial fibrillation with a maximum heart rate of 141 and average heart rate of 74 beats per minute. Echocardiogram in 2012 showed normal LV systolic function with moderate mitral tricuspid regurgitation. He denies chest pain or significant palpitations. He does have chronic exertional dyspnea. We did a Holter monitor in August 2015 which showed an average heart rate of 82 bpm.  Allergies  Allergen Reactions  . Baycol [Cerivastatin]   . Pravastatin      Current Outpatient Prescriptions on File Prior to Visit  Medication Sig Dispense Refill  . allopurinol (ZYLOPRIM) 300 MG tablet Take 300 mg by mouth daily.    Marland Kitchen buPROPion (WELLBUTRIN XL) 150 MG 24 hr tablet Take 75 mg by mouth daily.    . Choline Fenofibrate (TRILIPIX) 135 MG capsule Take 1 capsule (135 mg total) by mouth daily. 30 capsule 3  . Ergocalciferol (VITAMIN D2 PO) Take by mouth daily.    . Flaxseed, Linseed, (FLAX SEED OIL PO) Take by mouth daily.    . sertraline (ZOLOFT) 100 MG tablet Take 150 mg by mouth daily.    . vitamin E 400 UNIT capsule Take 400 Units by mouth daily.    Marland Kitchen warfarin (COUMADIN) 2.5 MG tablet Take as directed by anticoagulation clinic 30 tablet 1  . warfarin (COUMADIN) 3 MG tablet Take as directed by Coumadin clinic 90 tablet 1  . zolpidem (AMBIEN) 10 MG tablet Take 10 mg by mouth at bedtime as  needed for sleep.     No current facility-administered medications on file prior to visit.     Past Medical History  Diagnosis Date  . Hypertension   . Hyperlipidemia   . Insomnia   . Depression   . Chronic atrial fibrillation   . Sick sinus syndrome   . Severe sleep apnea   . Colonic polyp   . Edema   . COPD (chronic obstructive pulmonary disease)   . Gout      Past Surgical History  Procedure Laterality Date  . Nasal sinus surgery    . Cholecystectomy       Family History  Problem Relation Age of Onset  . Diabetes Mother   . Heart disease Mother   . Heart disease Father   . Aneurysm Father      History   Social History  . Marital Status: Married    Spouse Name: N/A  . Number of Children: N/A  . Years of Education: N/A   Occupational History  . Not on file.   Social History Main Topics  . Smoking status: Former Smoker -- 1.50 packs/day for 50 years    Types: Cigarettes    Quit date: 04/08/1989  . Smokeless tobacco: Never Used  . Alcohol Use: No  . Drug Use: No  . Sexual Activity: Not on file   Other Topics Concern  . Not on file   Social  History Narrative     ROS A 10 point review of system was performed. It is negative other than that mentioned in the history of present illness.   PHYSICAL EXAM   BP 122/80 mmHg  Pulse 80  Ht 5\' 7"  (1.702 m)  Wt 214 lb 4 oz (97.183 kg)  BMI 33.55 kg/m2 Constitutional: He is oriented to person, place, and time. He appears well-developed and well-nourished. No distress.  HENT: No nasal discharge.  Head: Normocephalic and atraumatic.  Eyes: Pupils are equal and round.  No discharge. Neck: Normal range of motion. Neck supple. No JVD present. No thyromegaly present.  Cardiovascular: Normal rate, irregular rhythm, normal heart sounds. Exam reveals no gallop and no friction rub. No murmur heard.  Pulmonary/Chest: Effort normal and breath sounds normal. No stridor. No respiratory distress. He has no wheezes.  He has no rales. He exhibits no tenderness.  Abdominal: Soft. Bowel sounds are normal. He exhibits no distension. There is no tenderness. There is no rebound and no guarding.  Musculoskeletal: Normal range of motion. He exhibits no edema and no tenderness.  Neurological: He is alert and oriented to person, place, and time. Coordination normal.  Skin: Skin is warm and dry. No rash noted. He is not diaphoretic. No erythema. No pallor.  Psychiatric: He has a normal mood and affect. His behavior is normal. Judgment and thought content normal.       ESP:QZRAQT fibrillation  -irregular conduction  ABNORMAL RHYTHM    ASSESSMENT AND PLAN

## 2014-05-26 ENCOUNTER — Other Ambulatory Visit: Payer: Self-pay | Admitting: *Deleted

## 2014-05-26 MED ORDER — CHOLINE FENOFIBRATE 135 MG PO CPDR: 135.0000 mg | DELAYED_RELEASE_CAPSULE | Freq: Every day | ORAL | Status: DC

## 2014-06-08 ENCOUNTER — Telehealth: Payer: Self-pay | Admitting: *Deleted

## 2014-06-08 NOTE — Telephone Encounter (Signed)
Pt wanted to rescheduled his appt.

## 2014-06-08 NOTE — Telephone Encounter (Signed)
Pt called asking if someone from coumadin could call him He refused to tell me what it was about.   Please call patient.

## 2014-06-15 ENCOUNTER — Ambulatory Visit (INDEPENDENT_AMBULATORY_CARE_PROVIDER_SITE_OTHER): Payer: Medicare Other

## 2014-06-15 DIAGNOSIS — Z5181 Encounter for therapeutic drug level monitoring: Secondary | ICD-10-CM

## 2014-06-15 DIAGNOSIS — I482 Chronic atrial fibrillation, unspecified: Secondary | ICD-10-CM

## 2014-06-15 LAB — POCT INR: INR: 2.1

## 2014-07-27 ENCOUNTER — Ambulatory Visit (INDEPENDENT_AMBULATORY_CARE_PROVIDER_SITE_OTHER): Payer: Medicare Other

## 2014-07-27 ENCOUNTER — Other Ambulatory Visit: Payer: Self-pay | Admitting: Cardiovascular Disease

## 2014-07-27 DIAGNOSIS — Z5181 Encounter for therapeutic drug level monitoring: Secondary | ICD-10-CM | POA: Diagnosis not present

## 2014-07-27 DIAGNOSIS — I482 Chronic atrial fibrillation, unspecified: Secondary | ICD-10-CM

## 2014-07-27 LAB — POCT INR: INR: 2.5

## 2014-07-28 NOTE — Telephone Encounter (Signed)
Pt requested refill, Thank you.

## 2014-09-07 ENCOUNTER — Ambulatory Visit (INDEPENDENT_AMBULATORY_CARE_PROVIDER_SITE_OTHER): Payer: Medicare Other

## 2014-09-07 DIAGNOSIS — I482 Chronic atrial fibrillation, unspecified: Secondary | ICD-10-CM

## 2014-09-07 DIAGNOSIS — Z5181 Encounter for therapeutic drug level monitoring: Secondary | ICD-10-CM | POA: Diagnosis not present

## 2014-09-07 LAB — POCT INR: INR: 2.5

## 2014-11-02 ENCOUNTER — Ambulatory Visit (INDEPENDENT_AMBULATORY_CARE_PROVIDER_SITE_OTHER): Payer: Medicare Other

## 2014-11-02 DIAGNOSIS — Z5181 Encounter for therapeutic drug level monitoring: Secondary | ICD-10-CM | POA: Diagnosis not present

## 2014-11-02 DIAGNOSIS — I482 Chronic atrial fibrillation, unspecified: Secondary | ICD-10-CM

## 2014-11-02 LAB — POCT INR: INR: 2.6

## 2014-11-09 ENCOUNTER — Other Ambulatory Visit: Payer: Self-pay | Admitting: Cardiovascular Disease

## 2014-12-01 ENCOUNTER — Other Ambulatory Visit: Payer: Self-pay

## 2014-12-01 MED ORDER — WARFARIN SODIUM 3 MG PO TABS: ORAL_TABLET | ORAL | Status: DC

## 2014-12-01 NOTE — Telephone Encounter (Signed)
Pt only needs 30 pills, is going out of town

## 2014-12-14 ENCOUNTER — Ambulatory Visit (INDEPENDENT_AMBULATORY_CARE_PROVIDER_SITE_OTHER): Payer: Medicare Other

## 2014-12-14 DIAGNOSIS — I482 Chronic atrial fibrillation, unspecified: Secondary | ICD-10-CM

## 2014-12-14 DIAGNOSIS — Z5181 Encounter for therapeutic drug level monitoring: Secondary | ICD-10-CM

## 2014-12-14 LAB — POCT INR: INR: 2.9

## 2014-12-23 ENCOUNTER — Other Ambulatory Visit: Payer: Self-pay | Admitting: Pharmacist Clinician (PhC)/ Clinical Pharmacy Specialist

## 2014-12-23 MED ORDER — WARFARIN SODIUM 3 MG PO TABS: ORAL_TABLET | ORAL | Status: DC

## 2015-01-25 ENCOUNTER — Ambulatory Visit (INDEPENDENT_AMBULATORY_CARE_PROVIDER_SITE_OTHER): Payer: Medicare Other

## 2015-01-25 DIAGNOSIS — I482 Chronic atrial fibrillation, unspecified: Secondary | ICD-10-CM

## 2015-01-25 DIAGNOSIS — Z5181 Encounter for therapeutic drug level monitoring: Secondary | ICD-10-CM

## 2015-01-25 LAB — POCT INR: INR: 2.4

## 2015-03-06 ENCOUNTER — Other Ambulatory Visit: Payer: Self-pay

## 2015-03-06 MED ORDER — WARFARIN SODIUM 3 MG PO TABS: ORAL_TABLET | ORAL | Status: DC

## 2015-03-06 MED ORDER — WARFARIN SODIUM 2.5 MG PO TABS: ORAL_TABLET | ORAL | Status: DC

## 2015-03-08 ENCOUNTER — Ambulatory Visit (INDEPENDENT_AMBULATORY_CARE_PROVIDER_SITE_OTHER): Payer: Medicare Other | Admitting: *Deleted

## 2015-03-08 DIAGNOSIS — I482 Chronic atrial fibrillation, unspecified: Secondary | ICD-10-CM

## 2015-03-08 DIAGNOSIS — Z5181 Encounter for therapeutic drug level monitoring: Secondary | ICD-10-CM | POA: Diagnosis not present

## 2015-03-08 LAB — POCT INR: INR: 3

## 2015-03-11 ENCOUNTER — Other Ambulatory Visit: Payer: Self-pay | Admitting: Cardiovascular Disease

## 2015-03-13 NOTE — Telephone Encounter (Signed)
Please review for refill, Thank you. 

## 2015-03-30 ENCOUNTER — Ambulatory Visit: Payer: Medicare Other | Admitting: Cardiovascular Disease

## 2015-04-11 DIAGNOSIS — M1712 Unilateral primary osteoarthritis, left knee: Secondary | ICD-10-CM | POA: Diagnosis not present

## 2015-04-12 ENCOUNTER — Other Ambulatory Visit: Payer: Self-pay | Admitting: Cardiovascular Disease

## 2015-04-19 ENCOUNTER — Ambulatory Visit (INDEPENDENT_AMBULATORY_CARE_PROVIDER_SITE_OTHER): Payer: PPO

## 2015-04-19 DIAGNOSIS — I482 Chronic atrial fibrillation, unspecified: Secondary | ICD-10-CM

## 2015-04-19 DIAGNOSIS — Z5181 Encounter for therapeutic drug level monitoring: Secondary | ICD-10-CM

## 2015-04-19 LAB — POCT INR: INR: 2.2

## 2015-04-25 DIAGNOSIS — M1712 Unilateral primary osteoarthritis, left knee: Secondary | ICD-10-CM | POA: Diagnosis not present

## 2015-05-04 ENCOUNTER — Ambulatory Visit (INDEPENDENT_AMBULATORY_CARE_PROVIDER_SITE_OTHER): Payer: PPO | Admitting: Cardiovascular Disease

## 2015-05-04 ENCOUNTER — Encounter: Payer: Self-pay | Admitting: Cardiovascular Disease

## 2015-05-04 VITALS — BP 118/86 | HR 85 | Ht 67.0 in | Wt 219.5 lb

## 2015-05-04 DIAGNOSIS — E785 Hyperlipidemia, unspecified: Secondary | ICD-10-CM | POA: Diagnosis not present

## 2015-05-04 DIAGNOSIS — I482 Chronic atrial fibrillation, unspecified: Secondary | ICD-10-CM

## 2015-05-04 DIAGNOSIS — R0609 Other forms of dyspnea: Secondary | ICD-10-CM | POA: Diagnosis not present

## 2015-05-04 NOTE — Patient Instructions (Signed)
Medication Instructions: No changes.   Labwork: None.   Procedures/Testing: None.   Follow-Up: 1 year with Dr. Danzel Marszalek.   Any Additional Special Instructions Will Be Listed Below (If Applicable).     If you need a refill on your cardiac medications before your next appointment, please call your pharmacy.   

## 2015-05-04 NOTE — Assessment & Plan Note (Signed)
He has no history of elevated triglyceride. Most recent lipid profile in November 2016 showed a cholesterol of 196, triglyceride of 227, HDL of 56 and an LDL of 114. He is chronically on fenofibrate acid and has been tolerating this medication.

## 2015-05-04 NOTE — Progress Notes (Signed)
HPI  This is a 81 year old man who is here for a follow-up visit regarding chronic atrial fibrillation. He has not been on any rate controlling medications. He is a previous smoker with known COPD. He also has sleep apnea treated with CPAP. Family history is remarkable for coronary artery disease but not prematurely. Most recent treadmill stress test was in February of 2014 which was negative for ischemia. He was able to exercise for 4 minutes and heart rate jumped quickly to 193 beats per minute.Echocardiogram in 2012 showed normal LV systolic function with moderate mitral tricuspid regurgitation. Holter monitor in August 2015  showed an average heart rate of 82 bpm.  He has been doing well and denies any chest pain, shortness of breath, palpitations or syncope. He reports no side effects with anticoagulation.  Allergies  Allergen Reactions  . Baycol [Cerivastatin]   . Pravastatin      Current Outpatient Prescriptions on File Prior to Visit  Medication Sig Dispense Refill  . allopurinol (ZYLOPRIM) 300 MG tablet Take 300 mg by mouth daily.    Marland Kitchen buPROPion (WELLBUTRIN XL) 150 MG 24 hr tablet Take 75 mg by mouth daily.    . Choline Fenofibrate (FENOFIBRIC ACID) 135 MG CPDR Take 1 capsule by mouth  daily 90 capsule 3  . Ergocalciferol (VITAMIN D2 PO) Take by mouth daily.    . Flaxseed, Linseed, (FLAX SEED OIL PO) Take by mouth daily.    . sertraline (ZOLOFT) 100 MG tablet Take 150 mg by mouth daily.    Marland Kitchen warfarin (COUMADIN) 2.5 MG tablet Take as directed by anticoagulation clinic 30 tablet 1  . warfarin (COUMADIN) 3 MG tablet Take 1 tablet by mouth daily or as directed by Coumadin Clinic 90 tablet 1  . zolpidem (AMBIEN) 10 MG tablet Take 10 mg by mouth at bedtime as needed for sleep.     No current facility-administered medications on file prior to visit.     Past Medical History  Diagnosis Date  . Hypertension   . Hyperlipidemia   . Insomnia   . Depression   . Chronic atrial  fibrillation ([place])   . Sick sinus syndrome ([place])   . Severe sleep apnea   . Colonic polyp   . Edema   . COPD (chronic obstructive pulmonary disease) ([place])   . Gout      Past Surgical History  Procedure Laterality Date  . Nasal sinus surgery    . Cholecystectomy       Family History  Problem Relation Age of Onset  . Diabetes Mother   . Heart disease Mother   . Heart disease Father   . Aneurysm Father      Social History   Social History  . Marital Status: Married    Spouse Name: N/A  . Number of Children: N/A  . Years of Education: N/A   Occupational History  . Not on file.   Social History Main Topics  . Smoking status: Former Smoker -- 1.50 packs/day for 50 years    Types: Cigarettes    Quit date: 04/08/1989  . Smokeless tobacco: Never Used  . Alcohol Use: No  . Drug Use: No  . Sexual Activity: Not on file   Other Topics Concern  . Not on file   Social History Narrative     ROS A 10 point review of system was performed. It is negative other than that mentioned in the history of present illness.   PHYSICAL EXAM   BP 118/86  mmHg  Pulse 85  Ht 5\' 7"  (1.702 m)  Wt 219 lb 8 oz (99.565 kg)  BMI 34.37 kg/m2 Constitutional: He is oriented to person, place, and time. He appears well-developed and well-nourished. No distress.  HENT: No nasal discharge.  Head: Normocephalic and atraumatic.  Eyes: Pupils are equal and round.  No discharge. Neck: Normal range of motion. Neck supple. No JVD present. No thyromegaly present.  Cardiovascular: Normal rate, irregular rhythm, normal heart sounds. Exam reveals no gallop and no friction rub. No murmur heard.  Pulmonary/Chest: Effort normal and breath sounds normal. No stridor. No respiratory distress. He has no wheezes. He has no rales. He exhibits no tenderness.  Abdominal: Soft. Bowel sounds are normal. He exhibits no distension. There is no tenderness. There is no rebound and no guarding.  Musculoskeletal:  Normal range of motion. He exhibits no edema and no tenderness.  Neurological: He is alert and oriented to person, place, and time. Coordination normal.  Skin: Skin is warm and dry. No rash noted. He is not diaphoretic. No erythema. No pallor.  Psychiatric: He has a normal mood and affect. His behavior is normal. Judgment and thought content normal.       [id number] fibrillation  -irregular conduction  ABNORMAL RHYTHM    ASSESSMENT AND PLAN

## 2015-05-04 NOTE — Assessment & Plan Note (Signed)
He is doing very well and remains asymptomatic. Ventricular rate is controlled without any medications. He is tolerating anticoagulation with warfarin reviewed his most recent labs in November done by Dr. Kary Kos and overall were unremarkable.

## 2015-05-11 DIAGNOSIS — G4733 Obstructive sleep apnea (adult) (pediatric): Secondary | ICD-10-CM | POA: Diagnosis not present

## 2015-05-18 ENCOUNTER — Other Ambulatory Visit: Payer: Self-pay

## 2015-05-18 MED ORDER — FENOFIBRIC ACID 135 MG PO CPDR: DELAYED_RELEASE_CAPSULE | ORAL | Status: DC

## 2015-05-31 ENCOUNTER — Ambulatory Visit (INDEPENDENT_AMBULATORY_CARE_PROVIDER_SITE_OTHER): Payer: PPO | Admitting: *Deleted

## 2015-05-31 DIAGNOSIS — I482 Chronic atrial fibrillation, unspecified: Secondary | ICD-10-CM

## 2015-05-31 DIAGNOSIS — Z5181 Encounter for therapeutic drug level monitoring: Secondary | ICD-10-CM

## 2015-05-31 LAB — POCT INR: INR: 2.8

## 2015-07-12 ENCOUNTER — Ambulatory Visit (INDEPENDENT_AMBULATORY_CARE_PROVIDER_SITE_OTHER): Payer: PPO

## 2015-07-12 DIAGNOSIS — I482 Chronic atrial fibrillation, unspecified: Secondary | ICD-10-CM

## 2015-07-12 DIAGNOSIS — Z5181 Encounter for therapeutic drug level monitoring: Secondary | ICD-10-CM

## 2015-07-12 LAB — POCT INR: INR: 2.5

## 2015-08-23 ENCOUNTER — Ambulatory Visit (INDEPENDENT_AMBULATORY_CARE_PROVIDER_SITE_OTHER): Payer: PPO

## 2015-08-23 DIAGNOSIS — Z5181 Encounter for therapeutic drug level monitoring: Secondary | ICD-10-CM | POA: Diagnosis not present

## 2015-08-23 DIAGNOSIS — I482 Chronic atrial fibrillation, unspecified: Secondary | ICD-10-CM

## 2015-08-23 LAB — POCT INR: INR: 2

## 2015-08-24 DIAGNOSIS — G4733 Obstructive sleep apnea (adult) (pediatric): Secondary | ICD-10-CM | POA: Diagnosis not present

## 2015-09-06 DIAGNOSIS — Z9989 Dependence on other enabling machines and devices: Secondary | ICD-10-CM | POA: Diagnosis not present

## 2015-09-06 DIAGNOSIS — G4733 Obstructive sleep apnea (adult) (pediatric): Secondary | ICD-10-CM | POA: Diagnosis not present

## 2015-09-28 ENCOUNTER — Telehealth: Payer: Self-pay | Admitting: Cardiovascular Disease

## 2015-09-28 MED ORDER — WARFARIN SODIUM 3 MG PO TABS: ORAL_TABLET | ORAL | Status: DC

## 2015-09-28 NOTE — Telephone Encounter (Signed)
Please review for refill, Thank you. 

## 2015-09-28 NOTE — Telephone Encounter (Signed)
*  STAT* If patient is at the pharmacy, call can be transferred to refill team.   1. Which medications need to be refilled? (please list name of each medication and dose if known) warfarin (COUMADIN) 3 MG tablet   2. Which pharmacy/location (including street and city if local pharmacy) is medication to be sent to? [facility] on [address]  3. Do they need a 30 day or 90 day supply? 90 day

## 2015-09-28 NOTE — Telephone Encounter (Signed)
Rx done. 

## 2015-10-04 ENCOUNTER — Ambulatory Visit (INDEPENDENT_AMBULATORY_CARE_PROVIDER_SITE_OTHER): Payer: PPO | Admitting: *Deleted

## 2015-10-04 DIAGNOSIS — Z5181 Encounter for therapeutic drug level monitoring: Secondary | ICD-10-CM

## 2015-10-04 DIAGNOSIS — I482 Chronic atrial fibrillation, unspecified: Secondary | ICD-10-CM

## 2015-10-04 LAB — POCT INR: INR: 3.1

## 2015-10-25 DIAGNOSIS — G4733 Obstructive sleep apnea (adult) (pediatric): Secondary | ICD-10-CM | POA: Diagnosis not present

## 2015-11-08 DIAGNOSIS — L812 Freckles: Secondary | ICD-10-CM | POA: Diagnosis not present

## 2015-11-08 DIAGNOSIS — D229 Melanocytic nevi, unspecified: Secondary | ICD-10-CM | POA: Diagnosis not present

## 2015-11-08 DIAGNOSIS — L820 Inflamed seborrheic keratosis: Secondary | ICD-10-CM | POA: Diagnosis not present

## 2015-11-08 DIAGNOSIS — L570 Actinic keratosis: Secondary | ICD-10-CM | POA: Diagnosis not present

## 2015-11-08 DIAGNOSIS — Z85828 Personal history of other malignant neoplasm of skin: Secondary | ICD-10-CM | POA: Diagnosis not present

## 2015-11-08 DIAGNOSIS — L821 Other seborrheic keratosis: Secondary | ICD-10-CM | POA: Diagnosis not present

## 2015-11-08 DIAGNOSIS — L578 Other skin changes due to chronic exposure to nonionizing radiation: Secondary | ICD-10-CM | POA: Diagnosis not present

## 2015-11-08 DIAGNOSIS — D1800 Hemangioma unspecified site: Secondary | ICD-10-CM | POA: Diagnosis not present

## 2015-11-15 ENCOUNTER — Ambulatory Visit (INDEPENDENT_AMBULATORY_CARE_PROVIDER_SITE_OTHER): Payer: PPO

## 2015-11-15 DIAGNOSIS — I482 Chronic atrial fibrillation, unspecified: Secondary | ICD-10-CM

## 2015-11-15 DIAGNOSIS — Z5181 Encounter for therapeutic drug level monitoring: Secondary | ICD-10-CM | POA: Diagnosis not present

## 2015-11-15 LAB — POCT INR: INR: 2.7

## 2015-11-25 DIAGNOSIS — G4733 Obstructive sleep apnea (adult) (pediatric): Secondary | ICD-10-CM | POA: Diagnosis not present

## 2015-12-20 ENCOUNTER — Ambulatory Visit (INDEPENDENT_AMBULATORY_CARE_PROVIDER_SITE_OTHER): Payer: PPO | Admitting: *Deleted

## 2015-12-20 DIAGNOSIS — Z5181 Encounter for therapeutic drug level monitoring: Secondary | ICD-10-CM | POA: Diagnosis not present

## 2015-12-20 DIAGNOSIS — I482 Chronic atrial fibrillation, unspecified: Secondary | ICD-10-CM

## 2015-12-20 DIAGNOSIS — Z961 Presence of intraocular lens: Secondary | ICD-10-CM | POA: Diagnosis not present

## 2015-12-20 LAB — POCT INR: INR: 2.6

## 2015-12-26 DIAGNOSIS — G4733 Obstructive sleep apnea (adult) (pediatric): Secondary | ICD-10-CM | POA: Diagnosis not present

## 2016-01-01 ENCOUNTER — Telehealth: Payer: Self-pay

## 2016-01-01 DIAGNOSIS — I10 Essential (primary) hypertension: Secondary | ICD-10-CM | POA: Diagnosis not present

## 2016-01-01 DIAGNOSIS — Z125 Encounter for screening for malignant neoplasm of prostate: Secondary | ICD-10-CM | POA: Diagnosis not present

## 2016-01-01 DIAGNOSIS — G4733 Obstructive sleep apnea (adult) (pediatric): Secondary | ICD-10-CM | POA: Diagnosis not present

## 2016-01-01 DIAGNOSIS — Z9989 Dependence on other enabling machines and devices: Secondary | ICD-10-CM | POA: Diagnosis not present

## 2016-01-01 NOTE — Telephone Encounter (Signed)
Patient dismissed from [facility], effective January 01, 2016. Dismissal letter sent out by certified / registered mail. DAJ

## 2016-01-04 DIAGNOSIS — G4733 Obstructive sleep apnea (adult) (pediatric): Secondary | ICD-10-CM | POA: Diagnosis not present

## 2016-01-11 NOTE — Telephone Encounter (Signed)
Received signed domestic return receipt verifying delivery of certified letter on January 04, 2016. Article number [id number] [place] DAJ

## 2016-01-23 DIAGNOSIS — I480 Paroxysmal atrial fibrillation: Secondary | ICD-10-CM | POA: Diagnosis not present

## 2016-01-23 DIAGNOSIS — R55 Syncope and collapse: Secondary | ICD-10-CM | POA: Diagnosis not present

## 2016-01-23 DIAGNOSIS — R002 Palpitations: Secondary | ICD-10-CM | POA: Diagnosis not present

## 2016-01-23 DIAGNOSIS — I4891 Unspecified atrial fibrillation: Secondary | ICD-10-CM | POA: Diagnosis not present

## 2016-01-23 DIAGNOSIS — I10 Essential (primary) hypertension: Secondary | ICD-10-CM | POA: Diagnosis not present

## 2016-01-23 DIAGNOSIS — Z7901 Long term (current) use of anticoagulants: Secondary | ICD-10-CM | POA: Diagnosis not present

## 2016-01-23 DIAGNOSIS — I482 Chronic atrial fibrillation: Secondary | ICD-10-CM | POA: Diagnosis not present

## 2016-01-23 DIAGNOSIS — I495 Sick sinus syndrome: Secondary | ICD-10-CM | POA: Diagnosis not present

## 2016-01-23 DIAGNOSIS — E782 Mixed hyperlipidemia: Secondary | ICD-10-CM | POA: Diagnosis not present

## 2016-01-31 DIAGNOSIS — R002 Palpitations: Secondary | ICD-10-CM | POA: Diagnosis not present

## 2016-01-31 DIAGNOSIS — I495 Sick sinus syndrome: Secondary | ICD-10-CM | POA: Diagnosis not present

## 2016-01-31 DIAGNOSIS — I480 Paroxysmal atrial fibrillation: Secondary | ICD-10-CM | POA: Diagnosis not present

## 2016-02-14 DIAGNOSIS — I272 Pulmonary hypertension, unspecified: Secondary | ICD-10-CM

## 2016-02-14 DIAGNOSIS — R55 Syncope and collapse: Secondary | ICD-10-CM | POA: Diagnosis not present

## 2016-02-14 DIAGNOSIS — I4891 Unspecified atrial fibrillation: Secondary | ICD-10-CM | POA: Diagnosis not present

## 2016-02-14 DIAGNOSIS — I6523 Occlusion and stenosis of bilateral carotid arteries: Secondary | ICD-10-CM | POA: Diagnosis not present

## 2016-02-14 HISTORY — DX: Pulmonary hypertension, unspecified: I27.20

## 2016-02-20 DIAGNOSIS — Z7901 Long term (current) use of anticoagulants: Secondary | ICD-10-CM | POA: Diagnosis not present

## 2016-02-21 DIAGNOSIS — R55 Syncope and collapse: Secondary | ICD-10-CM | POA: Diagnosis not present

## 2016-02-21 DIAGNOSIS — I482 Chronic atrial fibrillation: Secondary | ICD-10-CM | POA: Diagnosis not present

## 2016-03-05 DIAGNOSIS — L03115 Cellulitis of right lower limb: Secondary | ICD-10-CM | POA: Diagnosis not present

## 2016-03-05 DIAGNOSIS — M79671 Pain in right foot: Secondary | ICD-10-CM | POA: Diagnosis not present

## 2016-03-21 DIAGNOSIS — Z7901 Long term (current) use of anticoagulants: Secondary | ICD-10-CM | POA: Diagnosis not present

## 2016-03-21 DIAGNOSIS — I10 Essential (primary) hypertension: Secondary | ICD-10-CM | POA: Diagnosis not present

## 2016-03-21 DIAGNOSIS — Z125 Encounter for screening for malignant neoplasm of prostate: Secondary | ICD-10-CM | POA: Diagnosis not present

## 2016-04-18 DIAGNOSIS — Z7901 Long term (current) use of anticoagulants: Secondary | ICD-10-CM | POA: Diagnosis not present

## 2016-04-26 DIAGNOSIS — G4733 Obstructive sleep apnea (adult) (pediatric): Secondary | ICD-10-CM | POA: Diagnosis not present

## 2016-04-29 DIAGNOSIS — E785 Hyperlipidemia, unspecified: Secondary | ICD-10-CM | POA: Diagnosis not present

## 2016-04-29 DIAGNOSIS — D72829 Elevated white blood cell count, unspecified: Secondary | ICD-10-CM | POA: Diagnosis not present

## 2016-05-02 DIAGNOSIS — Z0000 Encounter for general adult medical examination without abnormal findings: Secondary | ICD-10-CM | POA: Diagnosis not present

## 2016-05-02 DIAGNOSIS — Z23 Encounter for immunization: Secondary | ICD-10-CM | POA: Diagnosis not present

## 2016-05-21 DIAGNOSIS — Z7901 Long term (current) use of anticoagulants: Secondary | ICD-10-CM | POA: Diagnosis not present

## 2016-05-27 DIAGNOSIS — G4733 Obstructive sleep apnea (adult) (pediatric): Secondary | ICD-10-CM | POA: Diagnosis not present

## 2016-06-19 DIAGNOSIS — Z7901 Long term (current) use of anticoagulants: Secondary | ICD-10-CM | POA: Diagnosis not present

## 2016-06-24 DIAGNOSIS — G4733 Obstructive sleep apnea (adult) (pediatric): Secondary | ICD-10-CM | POA: Diagnosis not present

## 2016-07-15 DIAGNOSIS — D485 Neoplasm of uncertain behavior of skin: Secondary | ICD-10-CM | POA: Diagnosis not present

## 2016-07-15 DIAGNOSIS — C44629 Squamous cell carcinoma of skin of left upper limb, including shoulder: Secondary | ICD-10-CM | POA: Diagnosis not present

## 2016-07-15 DIAGNOSIS — D0462 Carcinoma in situ of skin of left upper limb, including shoulder: Secondary | ICD-10-CM | POA: Diagnosis not present

## 2016-07-15 DIAGNOSIS — L570 Actinic keratosis: Secondary | ICD-10-CM | POA: Diagnosis not present

## 2016-07-17 DIAGNOSIS — Z7901 Long term (current) use of anticoagulants: Secondary | ICD-10-CM | POA: Diagnosis not present

## 2016-07-17 DIAGNOSIS — S82822A Torus fracture of lower end of left fibula, initial encounter for closed fracture: Secondary | ICD-10-CM | POA: Diagnosis not present

## 2016-07-17 DIAGNOSIS — M25572 Pain in left ankle and joints of left foot: Secondary | ICD-10-CM | POA: Diagnosis not present

## 2016-07-17 DIAGNOSIS — S82832A Other fracture of upper and lower end of left fibula, initial encounter for closed fracture: Secondary | ICD-10-CM | POA: Diagnosis not present

## 2016-07-17 DIAGNOSIS — S8262XA Displaced fracture of lateral malleolus of left fibula, initial encounter for closed fracture: Secondary | ICD-10-CM | POA: Diagnosis not present

## 2016-07-17 DIAGNOSIS — I10 Essential (primary) hypertension: Secondary | ICD-10-CM | POA: Diagnosis not present

## 2016-07-25 DIAGNOSIS — G4733 Obstructive sleep apnea (adult) (pediatric): Secondary | ICD-10-CM | POA: Diagnosis not present

## 2016-07-29 DIAGNOSIS — Z7901 Long term (current) use of anticoagulants: Secondary | ICD-10-CM | POA: Diagnosis not present

## 2016-07-29 DIAGNOSIS — Z5181 Encounter for therapeutic drug level monitoring: Secondary | ICD-10-CM | POA: Diagnosis not present

## 2016-07-31 DIAGNOSIS — M25572 Pain in left ankle and joints of left foot: Secondary | ICD-10-CM | POA: Diagnosis not present

## 2016-07-31 DIAGNOSIS — S82822D Torus fracture of lower end of left fibula, subsequent encounter for fracture with routine healing: Secondary | ICD-10-CM | POA: Diagnosis not present

## 2016-08-12 DIAGNOSIS — C44629 Squamous cell carcinoma of skin of left upper limb, including shoulder: Secondary | ICD-10-CM | POA: Diagnosis not present

## 2016-08-12 DIAGNOSIS — D0462 Carcinoma in situ of skin of left upper limb, including shoulder: Secondary | ICD-10-CM | POA: Diagnosis not present

## 2016-08-13 DIAGNOSIS — S82822D Torus fracture of lower end of left fibula, subsequent encounter for fracture with routine healing: Secondary | ICD-10-CM | POA: Diagnosis not present

## 2016-08-13 DIAGNOSIS — M25572 Pain in left ankle and joints of left foot: Secondary | ICD-10-CM | POA: Diagnosis not present

## 2016-08-13 DIAGNOSIS — Z5181 Encounter for therapeutic drug level monitoring: Secondary | ICD-10-CM | POA: Diagnosis not present

## 2016-08-13 DIAGNOSIS — Z7901 Long term (current) use of anticoagulants: Secondary | ICD-10-CM | POA: Diagnosis not present

## 2016-08-20 DIAGNOSIS — Z7901 Long term (current) use of anticoagulants: Secondary | ICD-10-CM | POA: Diagnosis not present

## 2016-08-24 DIAGNOSIS — G4733 Obstructive sleep apnea (adult) (pediatric): Secondary | ICD-10-CM | POA: Diagnosis not present

## 2016-09-04 DIAGNOSIS — G4733 Obstructive sleep apnea (adult) (pediatric): Secondary | ICD-10-CM | POA: Diagnosis not present

## 2016-09-04 DIAGNOSIS — S82822D Torus fracture of lower end of left fibula, subsequent encounter for fracture with routine healing: Secondary | ICD-10-CM | POA: Diagnosis not present

## 2016-09-18 DIAGNOSIS — Z7901 Long term (current) use of anticoagulants: Secondary | ICD-10-CM | POA: Diagnosis not present

## 2016-09-24 DIAGNOSIS — G4733 Obstructive sleep apnea (adult) (pediatric): Secondary | ICD-10-CM | POA: Diagnosis not present

## 2016-09-25 DIAGNOSIS — S82822D Torus fracture of lower end of left fibula, subsequent encounter for fracture with routine healing: Secondary | ICD-10-CM | POA: Diagnosis not present

## 2016-09-26 DIAGNOSIS — H903 Sensorineural hearing loss, bilateral: Secondary | ICD-10-CM | POA: Diagnosis not present

## 2016-10-24 DIAGNOSIS — G4733 Obstructive sleep apnea (adult) (pediatric): Secondary | ICD-10-CM | POA: Diagnosis not present

## 2016-11-06 DIAGNOSIS — Z7901 Long term (current) use of anticoagulants: Secondary | ICD-10-CM | POA: Diagnosis not present

## 2016-11-06 DIAGNOSIS — S82822D Torus fracture of lower end of left fibula, subsequent encounter for fracture with routine healing: Secondary | ICD-10-CM | POA: Diagnosis not present

## 2016-11-14 DIAGNOSIS — G4733 Obstructive sleep apnea (adult) (pediatric): Secondary | ICD-10-CM | POA: Diagnosis not present

## 2016-12-03 DIAGNOSIS — Z7901 Long term (current) use of anticoagulants: Secondary | ICD-10-CM | POA: Diagnosis not present

## 2016-12-16 ENCOUNTER — Other Ambulatory Visit: Payer: Self-pay | Admitting: Cardiovascular Disease

## 2016-12-16 NOTE — Telephone Encounter (Signed)
Refill Request.  

## 2017-01-01 DIAGNOSIS — Z7901 Long term (current) use of anticoagulants: Secondary | ICD-10-CM | POA: Diagnosis not present

## 2017-01-21 DIAGNOSIS — R001 Bradycardia, unspecified: Secondary | ICD-10-CM | POA: Diagnosis not present

## 2017-01-21 DIAGNOSIS — E782 Mixed hyperlipidemia: Secondary | ICD-10-CM | POA: Diagnosis not present

## 2017-01-21 DIAGNOSIS — I482 Chronic atrial fibrillation: Secondary | ICD-10-CM | POA: Diagnosis not present

## 2017-02-06 DIAGNOSIS — S82822D Torus fracture of lower end of left fibula, subsequent encounter for fracture with routine healing: Secondary | ICD-10-CM | POA: Diagnosis not present

## 2017-02-06 DIAGNOSIS — I482 Chronic atrial fibrillation: Secondary | ICD-10-CM | POA: Diagnosis not present

## 2017-03-10 DIAGNOSIS — I482 Chronic atrial fibrillation: Secondary | ICD-10-CM | POA: Diagnosis not present

## 2017-03-21 DIAGNOSIS — J0100 Acute maxillary sinusitis, unspecified: Secondary | ICD-10-CM | POA: Diagnosis not present

## 2017-03-26 DIAGNOSIS — G4733 Obstructive sleep apnea (adult) (pediatric): Secondary | ICD-10-CM | POA: Diagnosis not present

## 2017-04-09 DIAGNOSIS — I482 Chronic atrial fibrillation: Secondary | ICD-10-CM | POA: Diagnosis not present

## 2017-04-16 DIAGNOSIS — Z85828 Personal history of other malignant neoplasm of skin: Secondary | ICD-10-CM | POA: Diagnosis not present

## 2017-04-16 DIAGNOSIS — Z1283 Encounter for screening for malignant neoplasm of skin: Secondary | ICD-10-CM | POA: Diagnosis not present

## 2017-04-16 DIAGNOSIS — L720 Epidermal cyst: Secondary | ICD-10-CM | POA: Diagnosis not present

## 2017-04-16 DIAGNOSIS — D692 Other nonthrombocytopenic purpura: Secondary | ICD-10-CM | POA: Diagnosis not present

## 2017-04-16 DIAGNOSIS — L570 Actinic keratosis: Secondary | ICD-10-CM | POA: Diagnosis not present

## 2017-04-16 DIAGNOSIS — L812 Freckles: Secondary | ICD-10-CM | POA: Diagnosis not present

## 2017-04-16 DIAGNOSIS — L821 Other seborrheic keratosis: Secondary | ICD-10-CM | POA: Diagnosis not present

## 2017-05-08 DIAGNOSIS — I482 Chronic atrial fibrillation: Secondary | ICD-10-CM | POA: Diagnosis not present

## 2017-06-12 DIAGNOSIS — D692 Other nonthrombocytopenic purpura: Secondary | ICD-10-CM | POA: Diagnosis not present

## 2017-06-12 DIAGNOSIS — L570 Actinic keratosis: Secondary | ICD-10-CM | POA: Diagnosis not present

## 2017-06-12 DIAGNOSIS — L578 Other skin changes due to chronic exposure to nonionizing radiation: Secondary | ICD-10-CM | POA: Diagnosis not present

## 2017-06-12 DIAGNOSIS — L821 Other seborrheic keratosis: Secondary | ICD-10-CM | POA: Diagnosis not present

## 2017-06-16 DIAGNOSIS — Z125 Encounter for screening for malignant neoplasm of prostate: Secondary | ICD-10-CM | POA: Diagnosis not present

## 2017-06-16 DIAGNOSIS — I482 Chronic atrial fibrillation: Secondary | ICD-10-CM | POA: Diagnosis not present

## 2017-06-16 DIAGNOSIS — I10 Essential (primary) hypertension: Secondary | ICD-10-CM | POA: Diagnosis not present

## 2017-06-16 DIAGNOSIS — E785 Hyperlipidemia, unspecified: Secondary | ICD-10-CM | POA: Diagnosis not present

## 2017-06-16 DIAGNOSIS — E119 Type 2 diabetes mellitus without complications: Secondary | ICD-10-CM | POA: Diagnosis not present

## 2017-06-26 DIAGNOSIS — M1A9XX0 Chronic gout, unspecified, without tophus (tophi): Secondary | ICD-10-CM | POA: Diagnosis not present

## 2017-06-26 DIAGNOSIS — Z0000 Encounter for general adult medical examination without abnormal findings: Secondary | ICD-10-CM | POA: Diagnosis not present

## 2017-06-26 DIAGNOSIS — F5101 Primary insomnia: Secondary | ICD-10-CM | POA: Diagnosis not present

## 2017-06-26 DIAGNOSIS — F329 Major depressive disorder, single episode, unspecified: Secondary | ICD-10-CM | POA: Diagnosis not present

## 2017-07-14 DIAGNOSIS — G4733 Obstructive sleep apnea (adult) (pediatric): Secondary | ICD-10-CM | POA: Diagnosis not present

## 2017-07-16 DIAGNOSIS — I482 Chronic atrial fibrillation: Secondary | ICD-10-CM | POA: Diagnosis not present

## 2017-08-13 DIAGNOSIS — I482 Chronic atrial fibrillation: Secondary | ICD-10-CM | POA: Diagnosis not present

## 2017-09-11 DIAGNOSIS — I482 Chronic atrial fibrillation: Secondary | ICD-10-CM | POA: Diagnosis not present

## 2017-10-13 DIAGNOSIS — I482 Chronic atrial fibrillation: Secondary | ICD-10-CM | POA: Diagnosis not present

## 2017-11-11 DIAGNOSIS — I482 Chronic atrial fibrillation: Secondary | ICD-10-CM | POA: Diagnosis not present

## 2017-12-01 DIAGNOSIS — M47812 Spondylosis without myelopathy or radiculopathy, cervical region: Secondary | ICD-10-CM | POA: Diagnosis not present

## 2017-12-01 DIAGNOSIS — M5412 Radiculopathy, cervical region: Secondary | ICD-10-CM | POA: Diagnosis not present

## 2017-12-01 DIAGNOSIS — M25512 Pain in left shoulder: Secondary | ICD-10-CM | POA: Diagnosis not present

## 2017-12-15 DIAGNOSIS — I482 Chronic atrial fibrillation: Secondary | ICD-10-CM | POA: Diagnosis not present

## 2017-12-24 DIAGNOSIS — L821 Other seborrheic keratosis: Secondary | ICD-10-CM | POA: Diagnosis not present

## 2017-12-24 DIAGNOSIS — L820 Inflamed seborrheic keratosis: Secondary | ICD-10-CM | POA: Diagnosis not present

## 2017-12-24 DIAGNOSIS — D692 Other nonthrombocytopenic purpura: Secondary | ICD-10-CM | POA: Diagnosis not present

## 2017-12-24 DIAGNOSIS — L570 Actinic keratosis: Secondary | ICD-10-CM | POA: Diagnosis not present

## 2017-12-24 DIAGNOSIS — L578 Other skin changes due to chronic exposure to nonionizing radiation: Secondary | ICD-10-CM | POA: Diagnosis not present

## 2017-12-24 DIAGNOSIS — L905 Scar conditions and fibrosis of skin: Secondary | ICD-10-CM | POA: Diagnosis not present

## 2017-12-24 DIAGNOSIS — Z85828 Personal history of other malignant neoplasm of skin: Secondary | ICD-10-CM | POA: Diagnosis not present

## 2017-12-24 DIAGNOSIS — Z1283 Encounter for screening for malignant neoplasm of skin: Secondary | ICD-10-CM | POA: Diagnosis not present

## 2017-12-24 DIAGNOSIS — D1800 Hemangioma unspecified site: Secondary | ICD-10-CM | POA: Diagnosis not present

## 2017-12-24 DIAGNOSIS — L900 Lichen sclerosus et atrophicus: Secondary | ICD-10-CM | POA: Diagnosis not present

## 2018-01-13 DIAGNOSIS — G4733 Obstructive sleep apnea (adult) (pediatric): Secondary | ICD-10-CM | POA: Diagnosis not present

## 2018-01-13 DIAGNOSIS — Z7901 Long term (current) use of anticoagulants: Secondary | ICD-10-CM | POA: Diagnosis not present

## 2018-02-24 DIAGNOSIS — Z7901 Long term (current) use of anticoagulants: Secondary | ICD-10-CM | POA: Diagnosis not present

## 2018-03-27 DIAGNOSIS — Z7901 Long term (current) use of anticoagulants: Secondary | ICD-10-CM | POA: Diagnosis not present

## 2018-04-28 DIAGNOSIS — Z7901 Long term (current) use of anticoagulants: Secondary | ICD-10-CM | POA: Diagnosis not present

## 2018-05-26 DIAGNOSIS — Z7901 Long term (current) use of anticoagulants: Secondary | ICD-10-CM | POA: Diagnosis not present

## 2018-06-24 DIAGNOSIS — E785 Hyperlipidemia, unspecified: Secondary | ICD-10-CM | POA: Diagnosis not present

## 2018-06-24 DIAGNOSIS — Z0000 Encounter for general adult medical examination without abnormal findings: Secondary | ICD-10-CM | POA: Diagnosis not present

## 2018-06-24 DIAGNOSIS — Z7901 Long term (current) use of anticoagulants: Secondary | ICD-10-CM | POA: Diagnosis not present

## 2018-06-24 DIAGNOSIS — Z125 Encounter for screening for malignant neoplasm of prostate: Secondary | ICD-10-CM | POA: Diagnosis not present

## 2018-07-29 DIAGNOSIS — Z7901 Long term (current) use of anticoagulants: Secondary | ICD-10-CM | POA: Diagnosis not present

## 2018-08-11 DIAGNOSIS — F329 Major depressive disorder, single episode, unspecified: Secondary | ICD-10-CM | POA: Diagnosis not present

## 2018-08-11 DIAGNOSIS — E782 Mixed hyperlipidemia: Secondary | ICD-10-CM | POA: Diagnosis not present

## 2018-08-11 DIAGNOSIS — N183 Chronic kidney disease, stage 3 (moderate): Secondary | ICD-10-CM | POA: Diagnosis not present

## 2018-08-26 DIAGNOSIS — Z7901 Long term (current) use of anticoagulants: Secondary | ICD-10-CM | POA: Diagnosis not present

## 2018-09-29 DIAGNOSIS — Z7901 Long term (current) use of anticoagulants: Secondary | ICD-10-CM | POA: Diagnosis not present

## 2018-10-27 DIAGNOSIS — G4733 Obstructive sleep apnea (adult) (pediatric): Secondary | ICD-10-CM | POA: Diagnosis not present

## 2018-10-29 DIAGNOSIS — Z7901 Long term (current) use of anticoagulants: Secondary | ICD-10-CM | POA: Diagnosis not present

## 2018-11-07 IMAGING — CR DG CHOLANGIOGRAM OPERATIVE
1 series · 2 of 2 positions shown · non-contrast
Comparison: none

REASON FOR EXAM: Cholelithiasis
COMMENTS:

[imported/digitized images · 2 of 2 slices shown]
[im 1/2]
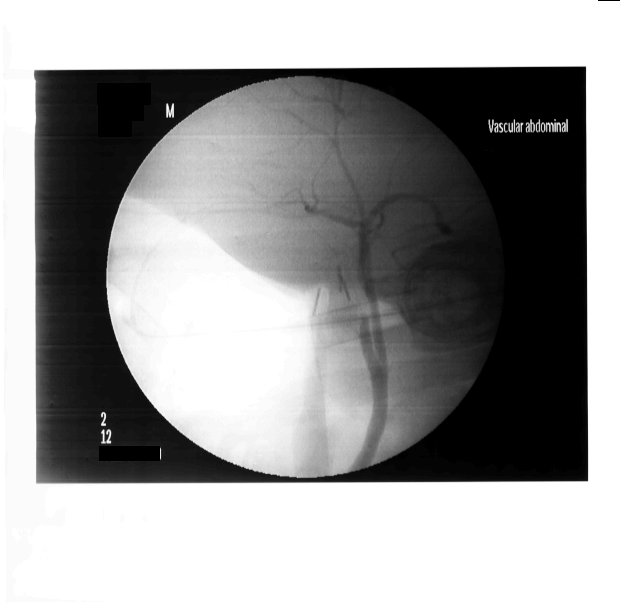
[im 2/2]
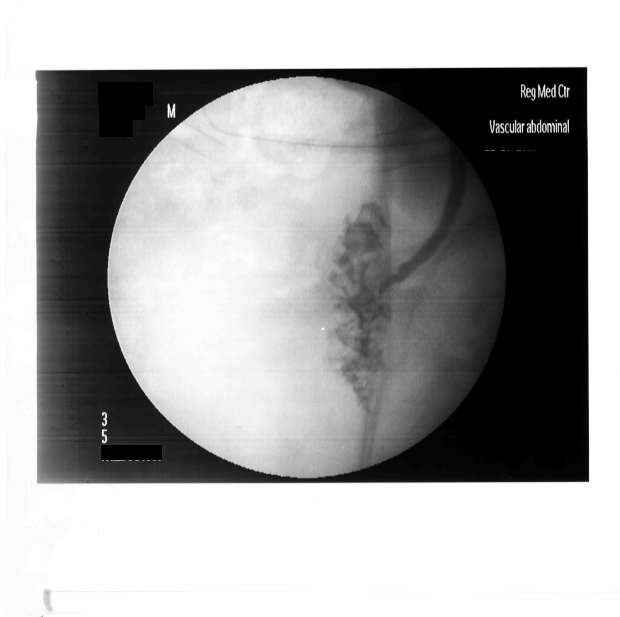

[2 of 2 positions shown; findings below may reference images not displayed]

PROCEDURE:     DXR - DXR CHOLANGIOGRAM OP (INITIAL)  - March 25, 2008 [DATE]

RESULT:     Two films from an Operative Cholangiogram are submitted.

The intrahepatic ducts, the cystic duct remnant and the common bile duct
appear normal. I do not see findings suspicious for a retained common bile
duct stone. Contrast is present in the duodenum as well.
IMPRESSION: I do not see findings suspicious for a retained common bile
duct stone.

## 2018-11-30 DIAGNOSIS — Z7901 Long term (current) use of anticoagulants: Secondary | ICD-10-CM | POA: Diagnosis not present

## 2018-12-03 DIAGNOSIS — G4733 Obstructive sleep apnea (adult) (pediatric): Secondary | ICD-10-CM | POA: Diagnosis not present

## 2018-12-17 DIAGNOSIS — R05 Cough: Secondary | ICD-10-CM | POA: Diagnosis not present

## 2018-12-17 DIAGNOSIS — Z20828 Contact with and (suspected) exposure to other viral communicable diseases: Secondary | ICD-10-CM | POA: Diagnosis not present

## 2018-12-17 DIAGNOSIS — R0981 Nasal congestion: Secondary | ICD-10-CM | POA: Diagnosis not present

## 2018-12-17 DIAGNOSIS — J3489 Other specified disorders of nose and nasal sinuses: Secondary | ICD-10-CM | POA: Diagnosis not present

## 2018-12-28 DIAGNOSIS — Z85828 Personal history of other malignant neoplasm of skin: Secondary | ICD-10-CM | POA: Diagnosis not present

## 2018-12-28 DIAGNOSIS — L570 Actinic keratosis: Secondary | ICD-10-CM | POA: Diagnosis not present

## 2018-12-28 DIAGNOSIS — L820 Inflamed seborrheic keratosis: Secondary | ICD-10-CM | POA: Diagnosis not present

## 2018-12-28 DIAGNOSIS — L821 Other seborrheic keratosis: Secondary | ICD-10-CM | POA: Diagnosis not present

## 2018-12-28 DIAGNOSIS — Z1283 Encounter for screening for malignant neoplasm of skin: Secondary | ICD-10-CM | POA: Diagnosis not present

## 2018-12-28 DIAGNOSIS — D1800 Hemangioma unspecified site: Secondary | ICD-10-CM | POA: Diagnosis not present

## 2019-01-04 DIAGNOSIS — I480 Paroxysmal atrial fibrillation: Secondary | ICD-10-CM | POA: Diagnosis not present

## 2019-01-13 DIAGNOSIS — Z23 Encounter for immunization: Secondary | ICD-10-CM | POA: Diagnosis not present

## 2019-01-13 DIAGNOSIS — N183 Chronic kidney disease, stage 3 unspecified: Secondary | ICD-10-CM | POA: Diagnosis not present

## 2019-01-13 DIAGNOSIS — I480 Paroxysmal atrial fibrillation: Secondary | ICD-10-CM | POA: Diagnosis not present

## 2019-01-13 DIAGNOSIS — E785 Hyperlipidemia, unspecified: Secondary | ICD-10-CM | POA: Diagnosis not present

## 2019-01-13 DIAGNOSIS — Z0000 Encounter for general adult medical examination without abnormal findings: Secondary | ICD-10-CM | POA: Diagnosis not present

## 2019-02-15 DIAGNOSIS — I480 Paroxysmal atrial fibrillation: Secondary | ICD-10-CM | POA: Diagnosis not present

## 2019-03-17 DIAGNOSIS — I480 Paroxysmal atrial fibrillation: Secondary | ICD-10-CM | POA: Diagnosis not present

## 2019-04-15 DIAGNOSIS — G4733 Obstructive sleep apnea (adult) (pediatric): Secondary | ICD-10-CM | POA: Diagnosis not present

## 2019-04-19 DIAGNOSIS — I480 Paroxysmal atrial fibrillation: Secondary | ICD-10-CM | POA: Diagnosis not present

## 2019-05-25 DIAGNOSIS — I480 Paroxysmal atrial fibrillation: Secondary | ICD-10-CM | POA: Diagnosis not present

## 2019-06-22 DIAGNOSIS — I480 Paroxysmal atrial fibrillation: Secondary | ICD-10-CM | POA: Diagnosis not present

## 2019-06-30 ENCOUNTER — Other Ambulatory Visit: Payer: Self-pay

## 2019-06-30 ENCOUNTER — Ambulatory Visit: Payer: PPO | Admitting: Dermatology

## 2019-06-30 DIAGNOSIS — D692 Other nonthrombocytopenic purpura: Secondary | ICD-10-CM

## 2019-06-30 DIAGNOSIS — L570 Actinic keratosis: Secondary | ICD-10-CM

## 2019-06-30 DIAGNOSIS — D1801 Hemangioma of skin and subcutaneous tissue: Secondary | ICD-10-CM | POA: Diagnosis not present

## 2019-06-30 DIAGNOSIS — Z85828 Personal history of other malignant neoplasm of skin: Secondary | ICD-10-CM | POA: Diagnosis not present

## 2019-06-30 DIAGNOSIS — Z86007 Personal history of in-situ neoplasm of skin: Secondary | ICD-10-CM | POA: Diagnosis not present

## 2019-06-30 DIAGNOSIS — D225 Melanocytic nevi of trunk: Secondary | ICD-10-CM | POA: Diagnosis not present

## 2019-06-30 DIAGNOSIS — L821 Other seborrheic keratosis: Secondary | ICD-10-CM

## 2019-06-30 DIAGNOSIS — Z1283 Encounter for screening for malignant neoplasm of skin: Secondary | ICD-10-CM

## 2019-06-30 DIAGNOSIS — L578 Other skin changes due to chronic exposure to nonionizing radiation: Secondary | ICD-10-CM | POA: Diagnosis not present

## 2019-06-30 DIAGNOSIS — L820 Inflamed seborrheic keratosis: Secondary | ICD-10-CM

## 2019-06-30 DIAGNOSIS — D229 Melanocytic nevi, unspecified: Secondary | ICD-10-CM

## 2019-06-30 DIAGNOSIS — L918 Other hypertrophic disorders of the skin: Secondary | ICD-10-CM

## 2019-06-30 NOTE — Patient Instructions (Signed)

## 2019-06-30 NOTE — Progress Notes (Signed)
   Follow-Up Visit   Subjective  Craig Gilmore is a 85 y.o. male who presents for the following: UBSE (hx SCC, AKs).   The following portions of the chart were reviewed this encounter and updated as appropriate:     Review of Systems: No other skin or systemic complaints.  Objective  Well appearing patient in no apparent distress; mood and affect are within normal limits.  All skin waist up examined.  Objective  scalp, face, ears (16): Pink scaly macules   Objective  face, scalp, body: Actinic changes  Objective  trunk: Red paps  Objective  L nose supratip: clear  Objective  L med infrapectoral: clear  Objective  face, neck, back (9): Stuck on waxy paps with erythema  Objective  trunk: Regular brown macules  Objective  arms: purpura  Objective  trunk: Stuck-on, waxy, tan-brown papule or plaque --Discussed benign etiology and prognosis.   Objective  bil axilla: Fleshy, skin-colored pedunculated papules.    Assessment & Plan  AK (actinic keratosis) (16) scalp, face, ears  Plan PDT with ALA in 4-6wks to the face  Destruction of lesion - scalp, face, ears Complexity: simple   Destruction method: cryotherapy   Informed consent: discussed and consent obtained   Timeout:  patient name, date of birth, surgical site, and procedure verified Lesion destroyed using liquid nitrogen: Yes   Region frozen until ice ball extended beyond lesion: Yes   Outcome: patient tolerated procedure well with no complications   Post-procedure details: wound care instructions given    Actinic skin damage face, scalp, body  Recommend broad spectrum SPF and photoprotection   Hemangioma of skin trunk  Benign, observe.    History of SCC (squamous cell carcinoma) of skin L nose supratip  Clear. No evidence of recurrence.   History of squamous cell carcinoma in situ (SCCIS) of skin L med infrapectoral  Clear. No evidence of recurrence.   Inflamed seborrheic  keratosis (9) face, neck, back  Destruction of lesion - face, neck, back Complexity: simple   Destruction method: cryotherapy   Informed consent: discussed and consent obtained   Timeout:  patient name, date of birth, surgical site, and procedure verified Lesion destroyed using liquid nitrogen: Yes   Region frozen until ice ball extended beyond lesion: Yes   Outcome: patient tolerated procedure well with no complications   Post-procedure details: wound care instructions given    Nevus trunk  Benign appearing, observe  Purpura (HCC) arms  Benign, observe.    Screening exam for skin cancer  Seborrheic keratosis trunk  Benign, observe.    Skin tag bil axilla  Benign, observe.    Return in about 3 months (around 09/30/2019) for f/u AK Dr. Dara Lords, [name], RMA, am acting as scribe for [name], MD .

## 2019-07-12 DIAGNOSIS — I10 Essential (primary) hypertension: Secondary | ICD-10-CM | POA: Diagnosis not present

## 2019-07-12 DIAGNOSIS — I482 Chronic atrial fibrillation, unspecified: Secondary | ICD-10-CM | POA: Diagnosis not present

## 2019-07-14 DIAGNOSIS — I10 Essential (primary) hypertension: Secondary | ICD-10-CM | POA: Diagnosis not present

## 2019-07-14 DIAGNOSIS — E785 Hyperlipidemia, unspecified: Secondary | ICD-10-CM | POA: Diagnosis not present

## 2019-07-14 DIAGNOSIS — N183 Chronic kidney disease, stage 3 unspecified: Secondary | ICD-10-CM | POA: Diagnosis not present

## 2019-07-14 DIAGNOSIS — R791 Abnormal coagulation profile: Secondary | ICD-10-CM | POA: Diagnosis not present

## 2019-07-14 DIAGNOSIS — I482 Chronic atrial fibrillation, unspecified: Secondary | ICD-10-CM | POA: Diagnosis not present

## 2019-07-15 DIAGNOSIS — G4733 Obstructive sleep apnea (adult) (pediatric): Secondary | ICD-10-CM | POA: Diagnosis not present

## 2019-08-12 DIAGNOSIS — I480 Paroxysmal atrial fibrillation: Secondary | ICD-10-CM | POA: Diagnosis not present

## 2019-08-17 ENCOUNTER — Other Ambulatory Visit: Payer: Self-pay

## 2019-08-17 ENCOUNTER — Ambulatory Visit: Payer: PPO

## 2019-08-17 DIAGNOSIS — L570 Actinic keratosis: Secondary | ICD-10-CM

## 2019-08-17 MED ORDER — AMINOLEVULINIC ACID HCL 20 % EX SOLR
1.0000 "application " | Freq: Once | CUTANEOUS | Status: AC
  Administered 2019-08-17: 354 mg via TOPICAL

## 2019-08-17 NOTE — Patient Instructions (Signed)

## 2019-08-17 NOTE — Progress Notes (Signed)
Patient completed PDT therapy today.  AK (actinic keratosis) Head - Anterior (Face)  Photodynamic therapy - Head - Anterior (Face) Procedure discussed: discussed risks, benefits, side effects. and alternatives   Prep: site scrubbed/prepped with acetone   Number of lesions:  Multiple Type of treatment:  Blue light Aminolevulinic Acid (see MAR for details): Levulan Number of Levulan sticks used:  1 Incubation time (minutes):  60 Number of minutes under lamp:  16 Number of seconds under lamp:  40 Cooling:  Floor fan Post-procedure details: sunscreen applied    Aminolevulinic Acid HCl 20 % SOLR 354 mg - Head - Anterior (Face)

## 2019-09-14 DIAGNOSIS — I480 Paroxysmal atrial fibrillation: Secondary | ICD-10-CM | POA: Diagnosis not present

## 2019-09-29 ENCOUNTER — Other Ambulatory Visit: Payer: Self-pay

## 2019-09-29 ENCOUNTER — Ambulatory Visit: Payer: PPO | Admitting: Dermatology

## 2019-09-29 DIAGNOSIS — L821 Other seborrheic keratosis: Secondary | ICD-10-CM | POA: Diagnosis not present

## 2019-09-29 DIAGNOSIS — L570 Actinic keratosis: Secondary | ICD-10-CM

## 2019-09-29 DIAGNOSIS — L578 Other skin changes due to chronic exposure to nonionizing radiation: Secondary | ICD-10-CM | POA: Diagnosis not present

## 2019-09-29 DIAGNOSIS — Z872 Personal history of diseases of the skin and subcutaneous tissue: Secondary | ICD-10-CM | POA: Diagnosis not present

## 2019-09-29 DIAGNOSIS — L720 Epidermal cyst: Secondary | ICD-10-CM

## 2019-09-29 NOTE — Progress Notes (Signed)
   Follow-Up Visit   Subjective  Craig Gilmore is a 85 y.o. male who presents for the following: Actinic Keratosis (face, PDT, LN2).  The following portions of the chart were reviewed this encounter and updated as appropriate:  Tobacco  Allergies  Meds  Problems  Med Hx  Surg Hx  Fam Hx      Review of Systems:  No other skin or systemic complaints except as noted in HPI or Assessment and Plan.  Objective  Well appearing patient in no apparent distress; mood and affect are within normal limits.  A focused examination was performed including face. Relevant physical exam findings are noted in the Assessment and Plan.  Objective  L temple: Open comedone  Objective  Scalp x 1, R hand x 2 (3): Pink scaly macules    Assessment & Plan    Actinic Damage - diffuse scaly erythematous macules with underlying dyspigmentation - Recommend daily broad spectrum sunscreen SPF 30+ to sun-exposed areas, reapply every 2 hours as needed.  - Call for new or changing lesions.  Seborrheic Keratoses - Stuck-on, waxy, tan-brown papules and plaques  - Discussed benign etiology and prognosis. - Observe - Call for any changes  History of actinic keratoses face  Good results from PDT with ALA  Epidermal cyst L temple  Benign, observe  AK (actinic keratosis) (3) Scalp x 1, R hand x 2  Destruction of lesion - Scalp x 1, R hand x 2 Complexity: simple   Destruction method: cryotherapy   Informed consent: discussed and consent obtained   Timeout:  patient name, date of birth, surgical site, and procedure verified Lesion destroyed using liquid nitrogen: Yes   Region frozen until ice ball extended beyond lesion: Yes   Outcome: patient tolerated procedure well with no complications   Post-procedure details: wound care instructions given    Return in about 8 months (around 05/31/2020) for AK f/u.  I, [name], RMA, am acting as scribe for [name], MD .  Documentation: I  have reviewed the above documentation for accuracy and completeness, and I agree with the above.  [name], MD

## 2019-09-30 ENCOUNTER — Encounter: Payer: Self-pay | Admitting: Dermatology

## 2019-10-14 DIAGNOSIS — G4733 Obstructive sleep apnea (adult) (pediatric): Secondary | ICD-10-CM | POA: Diagnosis not present

## 2019-10-18 DIAGNOSIS — I480 Paroxysmal atrial fibrillation: Secondary | ICD-10-CM | POA: Diagnosis not present

## 2019-11-17 DIAGNOSIS — I480 Paroxysmal atrial fibrillation: Secondary | ICD-10-CM | POA: Diagnosis not present

## 2019-12-22 DIAGNOSIS — I480 Paroxysmal atrial fibrillation: Secondary | ICD-10-CM | POA: Diagnosis not present

## 2020-01-05 DIAGNOSIS — I480 Paroxysmal atrial fibrillation: Secondary | ICD-10-CM | POA: Diagnosis not present

## 2020-01-12 DIAGNOSIS — E785 Hyperlipidemia, unspecified: Secondary | ICD-10-CM | POA: Diagnosis not present

## 2020-01-19 DIAGNOSIS — Z23 Encounter for immunization: Secondary | ICD-10-CM | POA: Diagnosis not present

## 2020-01-19 DIAGNOSIS — I10 Essential (primary) hypertension: Secondary | ICD-10-CM | POA: Diagnosis not present

## 2020-01-19 DIAGNOSIS — G4700 Insomnia, unspecified: Secondary | ICD-10-CM | POA: Diagnosis not present

## 2020-01-19 DIAGNOSIS — E782 Mixed hyperlipidemia: Secondary | ICD-10-CM | POA: Diagnosis not present

## 2020-01-19 DIAGNOSIS — Z0000 Encounter for general adult medical examination without abnormal findings: Secondary | ICD-10-CM | POA: Diagnosis not present

## 2020-01-19 DIAGNOSIS — I482 Chronic atrial fibrillation, unspecified: Secondary | ICD-10-CM | POA: Diagnosis not present

## 2020-01-19 DIAGNOSIS — M1A9XX0 Chronic gout, unspecified, without tophus (tophi): Secondary | ICD-10-CM | POA: Diagnosis not present

## 2020-01-19 DIAGNOSIS — F339 Major depressive disorder, recurrent, unspecified: Secondary | ICD-10-CM | POA: Diagnosis not present

## 2020-01-21 DIAGNOSIS — G4733 Obstructive sleep apnea (adult) (pediatric): Secondary | ICD-10-CM | POA: Diagnosis not present

## 2020-02-03 DIAGNOSIS — I480 Paroxysmal atrial fibrillation: Secondary | ICD-10-CM | POA: Diagnosis not present

## 2020-03-07 DIAGNOSIS — I480 Paroxysmal atrial fibrillation: Secondary | ICD-10-CM | POA: Diagnosis not present

## 2020-04-10 DIAGNOSIS — I480 Paroxysmal atrial fibrillation: Secondary | ICD-10-CM | POA: Diagnosis not present

## 2020-05-02 DIAGNOSIS — H903 Sensorineural hearing loss, bilateral: Secondary | ICD-10-CM | POA: Diagnosis not present

## 2020-05-02 DIAGNOSIS — J300 Vasomotor rhinitis: Secondary | ICD-10-CM | POA: Diagnosis not present

## 2020-05-02 DIAGNOSIS — H6122 Impacted cerumen, left ear: Secondary | ICD-10-CM | POA: Diagnosis not present

## 2020-05-09 DIAGNOSIS — I480 Paroxysmal atrial fibrillation: Secondary | ICD-10-CM | POA: Diagnosis not present

## 2020-05-15 DIAGNOSIS — G4733 Obstructive sleep apnea (adult) (pediatric): Secondary | ICD-10-CM | POA: Diagnosis not present

## 2020-05-29 ENCOUNTER — Encounter: Payer: PPO | Admitting: Dermatology

## 2020-06-07 DIAGNOSIS — I480 Paroxysmal atrial fibrillation: Secondary | ICD-10-CM | POA: Diagnosis not present

## 2020-07-11 DIAGNOSIS — I480 Paroxysmal atrial fibrillation: Secondary | ICD-10-CM | POA: Diagnosis not present

## 2020-08-10 DIAGNOSIS — I480 Paroxysmal atrial fibrillation: Secondary | ICD-10-CM | POA: Diagnosis not present

## 2020-08-17 DIAGNOSIS — M778 Other enthesopathies, not elsewhere classified: Secondary | ICD-10-CM | POA: Diagnosis not present

## 2020-08-17 DIAGNOSIS — Q828 Other specified congenital malformations of skin: Secondary | ICD-10-CM | POA: Diagnosis not present

## 2020-09-08 DIAGNOSIS — I10 Essential (primary) hypertension: Secondary | ICD-10-CM | POA: Diagnosis not present

## 2020-09-08 DIAGNOSIS — E782 Mixed hyperlipidemia: Secondary | ICD-10-CM | POA: Diagnosis not present

## 2020-09-08 DIAGNOSIS — I482 Chronic atrial fibrillation, unspecified: Secondary | ICD-10-CM | POA: Diagnosis not present

## 2020-09-12 DIAGNOSIS — I480 Paroxysmal atrial fibrillation: Secondary | ICD-10-CM | POA: Diagnosis not present

## 2020-10-17 DIAGNOSIS — I480 Paroxysmal atrial fibrillation: Secondary | ICD-10-CM | POA: Diagnosis not present

## 2020-10-30 ENCOUNTER — Ambulatory Visit: Payer: PPO | Admitting: Dermatology

## 2020-11-01 DIAGNOSIS — I480 Paroxysmal atrial fibrillation: Secondary | ICD-10-CM | POA: Diagnosis not present

## 2020-12-05 DIAGNOSIS — Z7901 Long term (current) use of anticoagulants: Secondary | ICD-10-CM | POA: Diagnosis not present

## 2021-01-04 DIAGNOSIS — G4733 Obstructive sleep apnea (adult) (pediatric): Secondary | ICD-10-CM | POA: Diagnosis not present

## 2021-01-08 ENCOUNTER — Ambulatory Visit: Payer: PPO | Admitting: Dermatology

## 2021-01-08 ENCOUNTER — Other Ambulatory Visit: Payer: Self-pay

## 2021-01-08 DIAGNOSIS — L570 Actinic keratosis: Secondary | ICD-10-CM | POA: Diagnosis not present

## 2021-01-08 DIAGNOSIS — L820 Inflamed seborrheic keratosis: Secondary | ICD-10-CM

## 2021-01-08 DIAGNOSIS — D229 Melanocytic nevi, unspecified: Secondary | ICD-10-CM

## 2021-01-08 DIAGNOSIS — D1800 Hemangioma unspecified site: Secondary | ICD-10-CM | POA: Diagnosis not present

## 2021-01-08 DIAGNOSIS — L578 Other skin changes due to chronic exposure to nonionizing radiation: Secondary | ICD-10-CM

## 2021-01-08 DIAGNOSIS — L821 Other seborrheic keratosis: Secondary | ICD-10-CM | POA: Diagnosis not present

## 2021-01-08 DIAGNOSIS — L814 Other melanin hyperpigmentation: Secondary | ICD-10-CM | POA: Diagnosis not present

## 2021-01-08 DIAGNOSIS — Z1283 Encounter for screening for malignant neoplasm of skin: Secondary | ICD-10-CM | POA: Diagnosis not present

## 2021-01-08 DIAGNOSIS — Z85828 Personal history of other malignant neoplasm of skin: Secondary | ICD-10-CM

## 2021-01-08 DIAGNOSIS — L817 Pigmented purpuric dermatosis: Secondary | ICD-10-CM | POA: Diagnosis not present

## 2021-01-08 MED ORDER — FLUOROURACIL 5 % EX CREA: TOPICAL_CREAM | Freq: Two times a day (BID) | CUTANEOUS | 1 refills | Status: DC

## 2021-01-08 NOTE — Patient Instructions (Signed)
Start Fluorouracil 5%/Calcipotriene cream twice daily x 7 days to forehead wait 1 month then twice daily x 7 days to temples and cheeks  Cryotherapy Aftercare  Wash gently with soap and water everyday.   Apply Vaseline and Band-Aid daily until healed.    If you have any questions or concerns for your doctor, please call our main line at [phone] and press option 4 to reach your doctor's medical assistant. If no one answers, please leave a voicemail as directed and we will return your call as soon as possible. Messages left after 4 pm will be answered the following business day.   You may also send [place] a message via [place]. We typically respond to MyChart messages within 1-2 business days.  For prescription refills, please ask your pharmacy to contact our office. Our fax number is [phone].  If you have an urgent issue when the clinic is closed that cannot wait until the next business day, you can page your doctor at the number below.    Please note that while we do our best to be available for urgent issues outside of office hours, we are not available 24/7.   If you have an urgent issue and are unable to reach [place], you may choose to seek medical care at your doctor's office, retail clinic, urgent care center, or emergency room.  If you have a medical emergency, please immediately call 911 or go to the emergency department.  Pager Numbers  - Dr. Nehemiah Massed: [phone]  - Dr. Laurence Ferrari: [phone]  - Dr. Nicole Kindred: [phone]  In the event of inclement weather, please call our main line at [phone] for an update on the status of any delays or closures.  Dermatology Medication Tips: Please keep the boxes that topical medications come in in order to help keep track of the instructions about where and how to use these. Pharmacies typically print the medication instructions only on the boxes and not directly on the medication tubes.   If your medication is too expensive, please  contact our office at [phone] option 4 or send [place] a message through [place].   We are unable to tell what your co-pay for medications will be in advance as this is different depending on your insurance coverage. However, we may be able to find a substitute medication at lower cost or fill out paperwork to get insurance to cover a needed medication.   If a prior authorization is required to get your medication covered by your insurance company, please allow [place] 1-2 business days to complete this process.  Drug prices often vary depending on where the prescription is filled and some pharmacies may offer cheaper prices.  The website www.goodrx.com contains coupons for medications through different pharmacies. The prices here do not account for what the cost may be with help from insurance (it may be cheaper with your insurance), but the website can give you the price if you did not use any insurance.  - You can print the associated coupon and take it with your prescription to the pharmacy.  - You may also stop by our office during regular business hours and pick up a GoodRx coupon card.  - If you need your prescription sent electronically to a different pharmacy, notify our office through [facility] or by phone at [phone] option 4.

## 2021-01-08 NOTE — Progress Notes (Signed)
Follow-Up Visit   Subjective  Craig Gilmore is a 86 y.o. male who presents for the following: Annual Exam (History of SCC - TBSE today/). Accompanied by wife  who contributes to history.  The patient presents for Total-Body Skin Exam (TBSE) for skin cancer screening and mole check.  The following portions of the chart were reviewed this encounter and updated as appropriate:   Tobacco  Allergies  Meds  Problems  Med Hx  Surg Hx  Fam Hx     Review of Systems:  No other skin or systemic complaints except as noted in HPI or Assessment and Plan.  Objective  Well appearing patient in no apparent distress; mood and affect are within normal limits.  A full examination was performed including scalp, head, eyes, ears, nose, lips, neck, chest, axillae, abdomen, back, buttocks, bilateral upper extremities, bilateral lower extremities, hands, feet, fingers, toes, fingernails, and toenails. All findings within normal limits unless otherwise noted below.  Face (15) Erythematous thin papules/macules with gritty scale.   Face Erythematous keratotic or waxy stuck-on papule or plaque.   Legs Hyperpigmentation   Assessment & Plan   History of Squamous Cell Carcinoma of the Skin - No evidence of recurrence today - No lymphadenopathy - Recommend regular full body skin exams - Recommend daily broad spectrum sunscreen SPF 30+ to sun-exposed areas, reapply every 2 hours as needed.  - Call if any new or changing lesions are noted between office visits  Lentigines - Scattered tan macules - Due to sun exposure - Benign-appearing, observe - Recommend daily broad spectrum sunscreen SPF 30+ to sun-exposed areas, reapply every 2 hours as needed. - Call for any changes  Seborrheic Keratoses - Stuck-on, waxy, tan-brown papules and/or plaques  - Benign-appearing - Discussed benign etiology and prognosis. - Observe - Call for any changes  Melanocytic Nevi - Tan-brown and/or  pink-flesh-colored symmetric macules and papules - Benign appearing on exam today - Observation - Call clinic for new or changing moles - Recommend daily use of broad spectrum spf 30+ sunscreen to sun-exposed areas.   Hemangiomas - Red papules - Discussed benign nature - Observe - Call for any changes  Actinic Damage - Chronic condition, secondary to cumulative UV/sun exposure - diffuse scaly erythematous macules with underlying dyspigmentation - Recommend daily broad spectrum sunscreen SPF 30+ to sun-exposed areas, reapply every 2 hours as needed.  - Staying in the shade or wearing long sleeves, sun glasses (UVA+UVB protection) and wide brim hats (4-inch brim around the entire circumference of the hat) are also recommended for sun protection.  - Call for new or changing lesions.  Skin cancer screening performed today.  AK (actinic keratosis) (15) Face Actinic Damage - Severe, confluent actinic changes with pre-cancerous actinic keratoses  - Severe, chronic, not at goal, secondary to cumulative UV radiation exposure over time - diffuse scaly erythematous macules and papules with underlying dyspigmentation - Discussed Prescription "Field Treatment" for Severe, Chronic Confluent Actinic Changes with Pre-Cancerous Actinic Keratoses Field treatment involves treatment of an entire area of skin that has confluent Actinic Changes (Sun/ Ultraviolet light damage) and PreCancerous Actinic Keratoses by method of PhotoDynamic Therapy (PDT) and/or prescription Topical Chemotherapy agents such as 5-fluorouracil, 5-fluorouracil/calcipotriene, and/or imiquimod.  The purpose is to decrease the number of clinically evident and subclinical PreCancerous lesions to prevent progression to development of skin cancer by chemically destroying early precancer changes that may or may not be visible.  It has been shown to reduce the risk of developing skin  cancer in the treated area. As a result of treatment,  redness, scaling, crusting, and open sores may occur during treatment course. One or more than one of these methods may be used and may have to be used several times to control, suppress and eliminate the PreCancerous changes. Discussed treatment course, expected reaction, and possible side effects. - Recommend daily broad spectrum sunscreen SPF 30+ to sun-exposed areas, reapply every 2 hours as needed.  - Staying in the shade or wearing long sleeves, sun glasses (UVA+UVB protection) and wide brim hats (4-inch brim around the entire circumference of the hat) are also recommended. - Call for new or changing lesions.  Start Fluorouracil 5%/Calcipotriene cream twice daily x 7 days to forehead wait 1 month then twice daily x 7 days to temples and cheeks  Destruction of lesion - Face Complexity: simple   Destruction method: cryotherapy   Informed consent: discussed and consent obtained   Timeout:  patient name, date of birth, surgical site, and procedure verified Lesion destroyed using liquid nitrogen: Yes   Region frozen until ice ball extended beyond lesion: Yes   Outcome: patient tolerated procedure well with no complications   Post-procedure details: wound care instructions given    fluorouracil (EFUDEX) 5 % cream - Face Apply topically 2 (two) times daily. Bid to forehead x 7 days, wait 1 month then bid to temples and cheeks  Inflamed seborrheic keratosis Face Destruction of lesion - Face Complexity: simple   Destruction method: cryotherapy   Informed consent: discussed and consent obtained   Timeout:  patient name, date of birth, surgical site, and procedure verified Lesion destroyed using liquid nitrogen: Yes   Region frozen until ice ball extended beyond lesion: Yes   Outcome: patient tolerated procedure well with no complications   Post-procedure details: wound care instructions given    Schamberg's purpura Legs Benign  Skin cancer screening  Return 4-6 months, for AK follow  up.  I, Ashok Cordia, CMA, am acting as scribe for [name], MD . Documentation: I have reviewed the above documentation for accuracy and completeness, and I agree with the above.  [name], MD

## 2021-01-09 ENCOUNTER — Encounter: Payer: Self-pay | Admitting: Dermatology

## 2021-01-09 DIAGNOSIS — Z7901 Long term (current) use of anticoagulants: Secondary | ICD-10-CM | POA: Diagnosis not present

## 2021-02-13 DIAGNOSIS — Z7901 Long term (current) use of anticoagulants: Secondary | ICD-10-CM | POA: Diagnosis not present

## 2021-04-06 DIAGNOSIS — G4733 Obstructive sleep apnea (adult) (pediatric): Secondary | ICD-10-CM | POA: Diagnosis not present

## 2021-04-16 DIAGNOSIS — Z7901 Long term (current) use of anticoagulants: Secondary | ICD-10-CM | POA: Diagnosis not present

## 2021-04-19 DIAGNOSIS — I10 Essential (primary) hypertension: Secondary | ICD-10-CM | POA: Diagnosis not present

## 2021-04-19 DIAGNOSIS — I482 Chronic atrial fibrillation, unspecified: Secondary | ICD-10-CM | POA: Diagnosis not present

## 2021-04-19 DIAGNOSIS — J069 Acute upper respiratory infection, unspecified: Secondary | ICD-10-CM | POA: Diagnosis not present

## 2021-04-30 DIAGNOSIS — R791 Abnormal coagulation profile: Secondary | ICD-10-CM | POA: Diagnosis not present

## 2021-05-17 DIAGNOSIS — I10 Essential (primary) hypertension: Secondary | ICD-10-CM | POA: Diagnosis not present

## 2021-05-17 DIAGNOSIS — E782 Mixed hyperlipidemia: Secondary | ICD-10-CM | POA: Diagnosis not present

## 2021-05-24 DIAGNOSIS — R413 Other amnesia: Secondary | ICD-10-CM | POA: Diagnosis not present

## 2021-05-24 DIAGNOSIS — Z0000 Encounter for general adult medical examination without abnormal findings: Secondary | ICD-10-CM | POA: Diagnosis not present

## 2021-05-24 DIAGNOSIS — I10 Essential (primary) hypertension: Secondary | ICD-10-CM | POA: Diagnosis not present

## 2021-05-24 DIAGNOSIS — F339 Major depressive disorder, recurrent, unspecified: Secondary | ICD-10-CM | POA: Diagnosis not present

## 2021-05-24 DIAGNOSIS — I482 Chronic atrial fibrillation, unspecified: Secondary | ICD-10-CM | POA: Diagnosis not present

## 2021-05-24 DIAGNOSIS — N1832 Chronic kidney disease, stage 3b: Secondary | ICD-10-CM | POA: Diagnosis not present

## 2021-05-29 DIAGNOSIS — Z7901 Long term (current) use of anticoagulants: Secondary | ICD-10-CM | POA: Diagnosis not present

## 2021-06-11 ENCOUNTER — Ambulatory Visit: Payer: PPO | Admitting: Dermatology

## 2021-06-27 DIAGNOSIS — Z7901 Long term (current) use of anticoagulants: Secondary | ICD-10-CM | POA: Diagnosis not present

## 2021-07-30 DIAGNOSIS — Z7901 Long term (current) use of anticoagulants: Secondary | ICD-10-CM | POA: Diagnosis not present

## 2021-08-10 DIAGNOSIS — G4733 Obstructive sleep apnea (adult) (pediatric): Secondary | ICD-10-CM | POA: Diagnosis not present

## 2021-08-29 DIAGNOSIS — Z7901 Long term (current) use of anticoagulants: Secondary | ICD-10-CM | POA: Diagnosis not present

## 2021-09-10 DIAGNOSIS — I10 Essential (primary) hypertension: Secondary | ICD-10-CM | POA: Diagnosis not present

## 2021-09-10 DIAGNOSIS — E782 Mixed hyperlipidemia: Secondary | ICD-10-CM | POA: Diagnosis not present

## 2021-09-10 DIAGNOSIS — I482 Chronic atrial fibrillation, unspecified: Secondary | ICD-10-CM | POA: Diagnosis not present

## 2021-09-12 DIAGNOSIS — H6122 Impacted cerumen, left ear: Secondary | ICD-10-CM | POA: Diagnosis not present

## 2021-09-12 DIAGNOSIS — T161XXA Foreign body in right ear, initial encounter: Secondary | ICD-10-CM | POA: Diagnosis not present

## 2021-09-12 DIAGNOSIS — H903 Sensorineural hearing loss, bilateral: Secondary | ICD-10-CM | POA: Diagnosis not present

## 2021-09-24 DIAGNOSIS — R791 Abnormal coagulation profile: Secondary | ICD-10-CM | POA: Diagnosis not present

## 2021-10-10 DIAGNOSIS — R791 Abnormal coagulation profile: Secondary | ICD-10-CM | POA: Diagnosis not present

## 2021-10-22 DIAGNOSIS — R791 Abnormal coagulation profile: Secondary | ICD-10-CM | POA: Diagnosis not present

## 2021-11-05 DIAGNOSIS — R791 Abnormal coagulation profile: Secondary | ICD-10-CM | POA: Diagnosis not present

## 2021-11-19 DIAGNOSIS — R791 Abnormal coagulation profile: Secondary | ICD-10-CM | POA: Diagnosis not present

## 2021-11-22 DIAGNOSIS — I482 Chronic atrial fibrillation, unspecified: Secondary | ICD-10-CM | POA: Diagnosis not present

## 2021-11-22 DIAGNOSIS — E782 Mixed hyperlipidemia: Secondary | ICD-10-CM | POA: Diagnosis not present

## 2021-11-22 DIAGNOSIS — L989 Disorder of the skin and subcutaneous tissue, unspecified: Secondary | ICD-10-CM | POA: Diagnosis not present

## 2021-11-22 DIAGNOSIS — I10 Essential (primary) hypertension: Secondary | ICD-10-CM | POA: Diagnosis not present

## 2021-11-28 ENCOUNTER — Ambulatory Visit: Payer: PPO | Admitting: Dermatology

## 2021-11-28 DIAGNOSIS — L578 Other skin changes due to chronic exposure to nonionizing radiation: Secondary | ICD-10-CM | POA: Diagnosis not present

## 2021-11-28 DIAGNOSIS — L821 Other seborrheic keratosis: Secondary | ICD-10-CM | POA: Diagnosis not present

## 2021-11-28 DIAGNOSIS — L820 Inflamed seborrheic keratosis: Secondary | ICD-10-CM | POA: Diagnosis not present

## 2021-11-28 DIAGNOSIS — L570 Actinic keratosis: Secondary | ICD-10-CM

## 2021-11-28 DIAGNOSIS — D1800 Hemangioma unspecified site: Secondary | ICD-10-CM

## 2021-11-28 DIAGNOSIS — Z79899 Other long term (current) drug therapy: Secondary | ICD-10-CM | POA: Diagnosis not present

## 2021-11-28 MED ORDER — FLUOROURACIL 5 % EX CREA: TOPICAL_CREAM | Freq: Two times a day (BID) | CUTANEOUS | 1 refills | Status: DC

## 2021-11-28 NOTE — Progress Notes (Signed)
Follow-Up Visit   Subjective  Craig Gilmore is a 87 y.o. male who presents for the following: Ak f/u (Face, >57mf/u, LN2, 5FU/Calcipotriene forehead, temples, cheeks). The patient has spots, moles and lesions to be evaluated, some may be new or changing and the patient has concerns that these could be cancer.  Patient accompanied by wife who contributes to history.  The following portions of the chart were reviewed this encounter and updated as appropriate:   Tobacco  Allergies  Meds  Problems  Med Hx  Surg Hx  Fam Hx     Review of Systems:  No other skin or systemic complaints except as noted in HPI or Assessment and Plan.  Objective  Well appearing patient in no apparent distress; mood and affect are within normal limits.  A focused examination was performed including face, arms, scalp, trunk. Relevant physical exam findings are noted in the Assessment and Plan.  L ear x 1, L infraorbital x 1 (2) Pink scaly macules  L top of shoulder x 1 Stuck on waxy paps with erythema   Assessment & Plan   Seborrheic Keratoses - Stuck-on, waxy, tan-brown papules and/or plaques  - Benign-appearing - Discussed benign etiology and prognosis. - Observe - Call for any changes  Actinic Damage - Severe, confluent actinic changes with pre-cancerous actinic keratoses  - Severe, chronic, not at goal, secondary to cumulative UV radiation exposure over time - diffuse scaly erythematous macules and papules with underlying dyspigmentation - Discussed Prescription "Field Treatment" for Severe, Chronic Confluent Actinic Changes with Pre-Cancerous Actinic Keratoses Field treatment involves treatment of an entire area of skin that has confluent Actinic Changes (Sun/ Ultraviolet light damage) and PreCancerous Actinic Keratoses by method of PhotoDynamic Therapy (PDT) and/or prescription Topical Chemotherapy agents such as 5-fluorouracil, 5-fluorouracil/calcipotriene, and/or imiquimod.  The purpose is  to decrease the number of clinically evident and subclinical PreCancerous lesions to prevent progression to development of skin cancer by chemically destroying early precancer changes that may or may not be visible.  It has been shown to reduce the risk of developing skin cancer in the treated area. As a result of treatment, redness, scaling, crusting, and open sores may occur during treatment course. One or more than one of these methods may be used and may have to be used several times to control, suppress and eliminate the PreCancerous changes. Discussed treatment course, expected reaction, and possible side effects. - Recommend daily broad spectrum sunscreen SPF 30+ to sun-exposed areas, reapply every 2 hours as needed.  - Staying in the shade or wearing long sleeves, sun glasses (UVA+UVB protection) and wide brim hats (4-inch brim around the entire circumference of the hat) are also recommended. - Call for new or changing lesions.  -Re-Start 5-fluorouracil/calcipotriene cream twice a day for 7 days to affected areas including forehead and temples.. Prescription sent to [facility]. Patient advised they will receive an email to purchase the medication online and have it sent to their home. Patient provided with handout reviewing treatment course and side effects and advised to call or message [place] MyChart with any concerns.   AK (actinic keratosis) (2) L ear x 1, L infraorbital x 1  L ear - If not resolved after 6 wks will plan bx  Destruction of lesion - L ear x 1, L infraorbital x 1 Complexity: simple   Destruction method: cryotherapy   Informed consent: discussed and consent obtained   Timeout:  patient name, date of birth, surgical  site, and procedure verified Lesion destroyed using liquid nitrogen: Yes   Region frozen until ice ball extended beyond lesion: Yes   Outcome: patient tolerated procedure well with no complications   Post-procedure details: wound care  instructions given    fluorouracil (EFUDEX) 5 % cream - L ear x 1, L infraorbital x 1 Apply topically 2 (two) times daily. Bid to forehead and temples for 7 days  Inflamed seborrheic keratosis L top of shoulder x 1  Symptomatic, irritating, patient would like treated.   Destruction of lesion - L top of shoulder x 1 Complexity: simple   Destruction method: cryotherapy   Informed consent: discussed and consent obtained   Timeout:  patient name, date of birth, surgical site, and procedure verified Lesion destroyed using liquid nitrogen: Yes   Region frozen until ice ball extended beyond lesion: Yes   Outcome: patient tolerated procedure well with no complications   Post-procedure details: wound care instructions given    Hemangiomas - Red papules - Discussed benign nature - Observe - Call for any changes  Return in about 6 months (around 05/31/2022) for UBSE, AK f/u.  I, Craig Gilmore, RMA, am acting as scribe for Craig Ser, MD . Documentation: I have reviewed the above documentation for accuracy and completeness, and I agree with the above.  Craig Ser, MD

## 2021-11-28 NOTE — Patient Instructions (Addendum)
- Start 5-fluorouracil/calcipotriene cream twice a day for 7 days to affected areas including forehead and temples. Prescription sent to [facility]. Patient advised they will receive an email to purchase the medication online and have it sent to their home. Patient provided with handout reviewing treatment course and side effects and advised to call or message [place] on MyChart with any concerns.    Instructions for [facility]  One or more of your medications was sent to the [facility]. You will receive an email from them and can purchase the medicine through that link. It will then be mailed to your home at the address you confirmed. If for any reason you do not receive an email from them, please check your spam folder. If you still do not find the email, please let us know. [facility] phone number is [phone].   5-Fluorouracil/Calcipotriene Patient Education   Actinic keratoses are the dry, red scaly spots on the skin caused by sun damage. A portion of these spots can turn into skin cancer with time, and treating them can help prevent development of skin cancer.   Treatment of these spots requires removal of the defective skin cells. There are various ways to remove actinic keratoses, including freezing with liquid nitrogen, treatment with creams, or treatment with a blue light procedure in the office.   5-fluorouracil cream is a topical cream used to treat actinic keratoses. It works by interfering with the growth of abnormal fast-growing skin cells, such as actinic keratoses. These cells peel off and are replaced by healthy ones.   5-fluorouracil/calcipotriene is a combination of the 5-fluorouracil cream with a vitamin D analog cream called calcipotriene. The calcipotriene alone does not treat actinic keratoses. However, when it is combined with 5-fluorouracil, it helps the 5-fluorouracil treat the actinic  keratoses much faster so that the same results can be achieved with a much shorter treatment time.  INSTRUCTIONS FOR 5-FLUOROURACIL/CALCIPOTRIENE CREAM:   5-fluorouracil/calcipotriene cream typically only needs to be used for 4-7 days. A thin layer should be applied twice a day to the treatment areas recommended by your physician.   If your physician prescribed you separate tubes of 5-fluourouracil and calcipotriene, apply a thin layer of 5-fluorouracil followed by a thin layer of calcipotriene.   Avoid contact with your eyes, nostrils, and mouth. Do not use 5-fluorouracil/calcipotriene cream on infected or open wounds.   You will develop redness, irritation and some crusting at areas where you have pre-cancer damage/actinic keratoses. IF YOU DEVELOP PAIN, BLEEDING, OR SIGNIFICANT CRUSTING, STOP THE TREATMENT EARLY - you have already gotten a good response and the actinic keratoses should clear up well.  Wash your hands after applying 5-fluorouracil 5% cream on your skin.   A moisturizer or sunscreen with a minimum SPF 30 should be applied each morning.   Once you have finished the treatment, you can apply a thin layer of Vaseline twice a day to irritated areas to soothe and calm the areas more quickly. If you experience significant discomfort, contact your physician.  For some patients it is necessary to repeat the treatment for best results.  SIDE EFFECTS: When using 5-fluorouracil/calcipotriene cream, you may have mild irritation, such as redness, dryness, swelling, or a mild burning sensation. This usually resolves within 2 weeks. The more actinic keratoses you have, the more redness and inflammation you can expect during treatment. Eye irritation has been reported rarely. If this occurs, please let us know.  If you  have any trouble using this cream, please call the office. If you have any other questions about this information, please do not hesitate to ask me before you leave the  office.   Cryotherapy Aftercare  Wash gently with soap and water everyday.   Apply Vaseline and Band-Aid daily until healed.     Due to recent changes in healthcare laws, you may see results of your pathology and/or laboratory studies on MyChart before the doctors have had a chance to review them. We understand that in some cases there may be results that are confusing or concerning to you. Please understand that not all results are received at the same time and often the doctors may need to interpret multiple results in order to provide you with the best plan of care or course of treatment. Therefore, we ask that you please give [place] 2 business days to thoroughly review all your results before contacting the office for clarification. Should we see a critical lab result, you will be contacted sooner.   If You Need Anything After Your Visit  If you have any questions or concerns for your doctor, please call our main line at [phone] and press option 4 to reach your doctor's medical assistant. If no one answers, please leave a voicemail as directed and we will return your call as soon as possible. Messages left after 4 pm will be answered the following business day.   You may also send [place] a message via [place]. We typically respond to MyChart messages within 1-2 business days.  For prescription refills, please ask your pharmacy to contact our office. Our fax number is [phone].  If you have an urgent issue when the clinic is closed that cannot wait until the next business day, you can page your doctor at the number below.    Please note that while we do our best to be available for urgent issues outside of office hours, we are not available 24/7.   If you have an urgent issue and are unable to reach [place], you may choose to seek medical care at your doctor's office, retail clinic, urgent care center, or emergency room.  If you have a medical emergency, please immediately call 911 or go to  the emergency department.  Pager Numbers  - Dr. Nehemiah Massed: [phone]  - Dr. Laurence Ferrari: [phone]  - Dr. Nicole Kindred: [phone]  In the event of inclement weather, please call our main line at [phone] for an update on the status of any delays or closures.  Dermatology Medication Tips: Please keep the boxes that topical medications come in in order to help keep track of the instructions about where and how to use these. Pharmacies typically print the medication instructions only on the boxes and not directly on the medication tubes.   If your medication is too expensive, please contact our office at [phone] option 4 or send [place] a message through [place].   We are unable to tell what your co-pay for medications will be in advance as this is different depending on your insurance coverage. However, we may be able to find a substitute medication at lower cost or fill out paperwork to get insurance to cover a needed medication.   If a prior authorization is required to get your medication covered by your insurance company, please allow [place] 1-2 business days to complete this process.  Drug prices often vary depending on where the prescription is filled and some pharmacies may offer cheaper prices.  The website www.goodrx.com  contains coupons for medications through different pharmacies. The prices here do not account for what the cost may be with help from insurance (it may be cheaper with your insurance), but the website can give you the price if you did not use any insurance.  - You can print the associated coupon and take it with your prescription to the pharmacy.  - You may also stop by our office during regular business hours and pick up a GoodRx coupon card.  - If you need your prescription sent electronically to a different pharmacy, notify our office through [facility] or by phone at [phone] option 4.     Si Usted Necesita Algo Despus de Su Visita  Tambin puede  enviarnos un mensaje a travs de Pharmacist, community. Por lo general respondemos a los mensajes de MyChart en el transcurso de 1 a 2 das hbiles.  Para renovar recetas, por favor pida a su farmacia que se ponga en contacto con nuestra oficina. Harland Dingwall de fax es [place] [phone].  Si tiene un asunto urgente cuando la clnica est cerrada y que no puede esperar hasta el siguiente da hbil, puede llamar/localizar a su doctor(a) al nmero que aparece a continuacin.   Por favor, tenga en cuenta que aunque hacemos todo lo posible para estar disponibles para asuntos urgentes fuera del horario de [place], no estamos disponibles las 24 horas del da, los 7 das de la [place].   Si tiene un problema urgente y no puede comunicarse con nosotros, puede optar por buscar atencin mdica  en el consultorio de su doctor(a), en una clnica privada, en un centro de atencin urgente o en una sala de emergencias.  Si tiene Engineering geologist, por favor llame inmediatamente al 911 o vaya a la sala de emergencias.  Nmeros de bper  - Dr. Nehemiah Massed: [phone]  - Dra. Moye: [phone]  - Dra. Nicole Kindred: [phone]  En caso de inclemencias del [place], por favor llame a Johnsie Kindred principal al [phone] para una actualizacin sobre el [place] de cualquier retraso o cierre.  Consejos para la medicacin en dermatologa: Por favor, guarde las cajas en las que vienen los medicamentos de uso tpico para ayudarle a seguir las instrucciones sobre dnde y cmo usarlos. Las farmacias generalmente imprimen las instrucciones del medicamento slo en las cajas y no directamente en los tubos del [place].   Si su medicamento es muy caro, por favor, pngase en contacto con Zigmund Daniel llamando al [phone] y presione la opcin 4 o envenos un mensaje a travs de Pharmacist, community.   No podemos decirle cul ser su copago por los medicamentos por adelantado ya que esto es diferente dependiendo de la cobertura de su seguro.  Sin embargo, es posible que podamos encontrar un medicamento sustituto a Electrical engineer un formulario para que el seguro cubra el medicamento que se considera necesario.   Si se requiere una autorizacin previa para que su compaa de seguros [place] su medicamento, por favor permtanos de 1 a 2 das hbiles para completar este proceso.  Los precios de los medicamentos varan con frecuencia dependiendo del Environmental consultant de dnde se surte la receta y alguna farmacias pueden ofrecer precios ms baratos.  El sitio web www.goodrx.com tiene cupones para medicamentos de Airline pilot. Los precios aqu no tienen en cuenta lo que podra costar con la ayuda del seguro (puede ser ms barato con su seguro), pero el sitio web puede darle el precio si no utiliz Research scientist (physical sciences).  - Puede imprimir el  cupn correspondiente y llevarlo con su receta a la farmacia.  - Tambin puede pasar por nuestra oficina durante el horario de atencin regular y Charity fundraiser una tarjeta de cupones de GoodRx.  - Si necesita que su receta se enve electrnicamente a una farmacia diferente, informe a nuestra oficina a travs de MyChart de [place] o por telfono llamando al [phone] y presione la opcin 4.

## 2021-12-01 ENCOUNTER — Encounter: Payer: Self-pay | Admitting: Dermatology

## 2021-12-04 DIAGNOSIS — R791 Abnormal coagulation profile: Secondary | ICD-10-CM | POA: Diagnosis not present

## 2021-12-22 DIAGNOSIS — M10072 Idiopathic gout, left ankle and foot: Secondary | ICD-10-CM | POA: Diagnosis not present

## 2022-01-02 DIAGNOSIS — M10072 Idiopathic gout, left ankle and foot: Secondary | ICD-10-CM | POA: Diagnosis not present

## 2022-01-02 DIAGNOSIS — M10071 Idiopathic gout, right ankle and foot: Secondary | ICD-10-CM | POA: Diagnosis not present

## 2022-01-08 DIAGNOSIS — R791 Abnormal coagulation profile: Secondary | ICD-10-CM | POA: Diagnosis not present

## 2022-02-01 ENCOUNTER — Other Ambulatory Visit: Payer: Self-pay

## 2022-02-01 MED ORDER — COVID-19 MRNA 2023-2024 VACCINE (COMIRNATY) 0.3 ML INJECTION
0.3000 mL | Freq: Once | INTRAMUSCULAR | 0 refills | Status: AC
  Filled 2022-02-07: qty 0.3, 1d supply, fill #0

## 2022-02-05 DIAGNOSIS — N1832 Chronic kidney disease, stage 3b: Secondary | ICD-10-CM | POA: Diagnosis not present

## 2022-02-05 DIAGNOSIS — I482 Chronic atrial fibrillation, unspecified: Secondary | ICD-10-CM | POA: Diagnosis not present

## 2022-02-05 DIAGNOSIS — J069 Acute upper respiratory infection, unspecified: Secondary | ICD-10-CM | POA: Diagnosis not present

## 2022-02-07 ENCOUNTER — Other Ambulatory Visit: Payer: Self-pay

## 2022-02-07 DIAGNOSIS — N1832 Chronic kidney disease, stage 3b: Secondary | ICD-10-CM | POA: Diagnosis present

## 2022-02-07 DIAGNOSIS — M1A0790 Idiopathic chronic gout, unspecified ankle and foot, without tophus (tophi): Secondary | ICD-10-CM | POA: Diagnosis not present

## 2022-02-07 DIAGNOSIS — R791 Abnormal coagulation profile: Secondary | ICD-10-CM | POA: Diagnosis not present

## 2022-02-07 DIAGNOSIS — Z79899 Other long term (current) drug therapy: Secondary | ICD-10-CM | POA: Diagnosis not present

## 2022-02-22 DIAGNOSIS — G4733 Obstructive sleep apnea (adult) (pediatric): Secondary | ICD-10-CM | POA: Diagnosis not present

## 2022-03-07 DIAGNOSIS — Z7901 Long term (current) use of anticoagulants: Secondary | ICD-10-CM | POA: Diagnosis not present

## 2022-03-21 DIAGNOSIS — Z79899 Other long term (current) drug therapy: Secondary | ICD-10-CM | POA: Diagnosis not present

## 2022-03-21 DIAGNOSIS — M1A0790 Idiopathic chronic gout, unspecified ankle and foot, without tophus (tophi): Secondary | ICD-10-CM | POA: Diagnosis not present

## 2022-04-04 DIAGNOSIS — Z7901 Long term (current) use of anticoagulants: Secondary | ICD-10-CM | POA: Diagnosis not present

## 2022-04-09 DIAGNOSIS — I10 Essential (primary) hypertension: Secondary | ICD-10-CM | POA: Diagnosis not present

## 2022-04-09 DIAGNOSIS — G4733 Obstructive sleep apnea (adult) (pediatric): Secondary | ICD-10-CM | POA: Diagnosis not present

## 2022-04-19 DIAGNOSIS — Z03818 Encounter for observation for suspected exposure to other biological agents ruled out: Secondary | ICD-10-CM | POA: Diagnosis not present

## 2022-04-19 DIAGNOSIS — J069 Acute upper respiratory infection, unspecified: Secondary | ICD-10-CM | POA: Diagnosis not present

## 2022-04-25 DIAGNOSIS — H6121 Impacted cerumen, right ear: Secondary | ICD-10-CM | POA: Diagnosis not present

## 2022-04-25 DIAGNOSIS — H61032 Chondritis of left external ear: Secondary | ICD-10-CM | POA: Diagnosis not present

## 2022-04-25 DIAGNOSIS — R059 Cough, unspecified: Secondary | ICD-10-CM | POA: Diagnosis not present

## 2022-05-01 DIAGNOSIS — Z7901 Long term (current) use of anticoagulants: Secondary | ICD-10-CM | POA: Diagnosis not present

## 2022-05-06 DIAGNOSIS — Z7901 Long term (current) use of anticoagulants: Secondary | ICD-10-CM | POA: Diagnosis not present

## 2022-05-10 DIAGNOSIS — G4733 Obstructive sleep apnea (adult) (pediatric): Secondary | ICD-10-CM | POA: Diagnosis not present

## 2022-05-10 DIAGNOSIS — I10 Essential (primary) hypertension: Secondary | ICD-10-CM | POA: Diagnosis not present

## 2022-05-13 DIAGNOSIS — Z7901 Long term (current) use of anticoagulants: Secondary | ICD-10-CM | POA: Diagnosis not present

## 2022-05-16 DIAGNOSIS — R052 Subacute cough: Secondary | ICD-10-CM | POA: Diagnosis not present

## 2022-05-16 DIAGNOSIS — H61032 Chondritis of left external ear: Secondary | ICD-10-CM | POA: Diagnosis not present

## 2022-05-16 DIAGNOSIS — H903 Sensorineural hearing loss, bilateral: Secondary | ICD-10-CM | POA: Diagnosis not present

## 2022-05-20 DIAGNOSIS — I10 Essential (primary) hypertension: Secondary | ICD-10-CM | POA: Diagnosis not present

## 2022-05-20 DIAGNOSIS — Z7901 Long term (current) use of anticoagulants: Secondary | ICD-10-CM | POA: Diagnosis not present

## 2022-05-20 DIAGNOSIS — E782 Mixed hyperlipidemia: Secondary | ICD-10-CM | POA: Diagnosis not present

## 2022-05-27 DIAGNOSIS — I482 Chronic atrial fibrillation, unspecified: Secondary | ICD-10-CM | POA: Diagnosis not present

## 2022-05-27 DIAGNOSIS — G4733 Obstructive sleep apnea (adult) (pediatric): Secondary | ICD-10-CM | POA: Diagnosis not present

## 2022-05-27 DIAGNOSIS — E782 Mixed hyperlipidemia: Secondary | ICD-10-CM | POA: Diagnosis not present

## 2022-05-27 DIAGNOSIS — M1A0790 Idiopathic chronic gout, unspecified ankle and foot, without tophus (tophi): Secondary | ICD-10-CM | POA: Diagnosis not present

## 2022-05-27 DIAGNOSIS — N1832 Chronic kidney disease, stage 3b: Secondary | ICD-10-CM | POA: Diagnosis not present

## 2022-05-27 DIAGNOSIS — Z0000 Encounter for general adult medical examination without abnormal findings: Secondary | ICD-10-CM | POA: Diagnosis not present

## 2022-05-27 DIAGNOSIS — F339 Major depressive disorder, recurrent, unspecified: Secondary | ICD-10-CM | POA: Diagnosis not present

## 2022-05-27 DIAGNOSIS — I10 Essential (primary) hypertension: Secondary | ICD-10-CM | POA: Diagnosis not present

## 2022-06-03 DIAGNOSIS — R791 Abnormal coagulation profile: Secondary | ICD-10-CM | POA: Diagnosis not present

## 2022-06-08 DIAGNOSIS — I10 Essential (primary) hypertension: Secondary | ICD-10-CM | POA: Diagnosis not present

## 2022-06-08 DIAGNOSIS — G4733 Obstructive sleep apnea (adult) (pediatric): Secondary | ICD-10-CM | POA: Diagnosis not present

## 2022-06-10 DIAGNOSIS — R791 Abnormal coagulation profile: Secondary | ICD-10-CM | POA: Diagnosis not present

## 2022-06-12 ENCOUNTER — Ambulatory Visit: Payer: PPO | Admitting: Dermatology

## 2022-06-13 ENCOUNTER — Ambulatory Visit: Payer: PPO | Admitting: Dermatology

## 2022-06-20 ENCOUNTER — Ambulatory Visit: Payer: No Typology Code available for payment source | Admitting: Dermatology

## 2022-06-20 VITALS — BP 150/89

## 2022-06-20 DIAGNOSIS — Z5111 Encounter for antineoplastic chemotherapy: Secondary | ICD-10-CM

## 2022-06-20 DIAGNOSIS — L570 Actinic keratosis: Secondary | ICD-10-CM | POA: Diagnosis not present

## 2022-06-20 DIAGNOSIS — Z79899 Other long term (current) drug therapy: Secondary | ICD-10-CM | POA: Diagnosis not present

## 2022-06-20 DIAGNOSIS — L814 Other melanin hyperpigmentation: Secondary | ICD-10-CM | POA: Diagnosis not present

## 2022-06-20 DIAGNOSIS — D692 Other nonthrombocytopenic purpura: Secondary | ICD-10-CM | POA: Diagnosis not present

## 2022-06-20 DIAGNOSIS — L578 Other skin changes due to chronic exposure to nonionizing radiation: Secondary | ICD-10-CM

## 2022-06-20 DIAGNOSIS — Z1283 Encounter for screening for malignant neoplasm of skin: Secondary | ICD-10-CM | POA: Diagnosis not present

## 2022-06-20 DIAGNOSIS — L821 Other seborrheic keratosis: Secondary | ICD-10-CM

## 2022-06-20 DIAGNOSIS — D229 Melanocytic nevi, unspecified: Secondary | ICD-10-CM

## 2022-06-20 NOTE — Progress Notes (Signed)
Follow-Up Visit   Subjective  Craig Gilmore is a 88 y.o. male who presents for the following: Actinic Keratosis (Face, scalp, left ear - treated with LN2 and 5FU/calcipotriene - The patient presents for Upper Body Skin Exam (UBSE) for skin cancer screening and mole check.  The patient has spots, moles and lesions to be evaluated, some may be new or changing and the patient has concerns that these could be cancer./).  The following portions of the chart were reviewed this encounter and updated as appropriate:   Tobacco  Allergies  Meds  Problems  Med Hx  Surg Hx  Fam Hx     Review of Systems:  No other skin or systemic complaints except as noted in HPI or Assessment and Plan.  Objective  Well appearing patient in no apparent distress; mood and affect are within normal limits.  All skin waist up examined.  Scalp x 1, left ear x 1, face x 15 (17) Erythematous thin papules/macules with gritty scale.    Assessment & Plan   Purpura - Chronic; persistent and recurrent.  Treatable, but not curable. - Violaceous macules and patches - Benign - Related to trauma, age, sun damage and/or use of blood thinners, chronic use of topical and/or oral steroids - Observe - Can use OTC arnica containing moisturizer such as Dermend Bruise Formula if desired - Call for worsening or other concerns  Lentigines - Scattered tan macules - Due to sun exposure - Benign-appearing, observe - Recommend daily broad spectrum sunscreen SPF 30+ to sun-exposed areas, reapply every 2 hours as needed. - Call for any changes  Seborrheic Keratoses - Stuck-on, waxy, tan-brown papules and/or plaques  - Benign-appearing - Discussed benign etiology and prognosis. - Observe - Call for any changes  Melanocytic Nevi - Tan-brown and/or pink-flesh-colored symmetric macules and papules - Benign appearing on exam today - Observation - Call clinic for new or changing moles - Recommend daily use of broad spectrum  spf 30+ sunscreen to sun-exposed areas.   Hemangiomas - Red papules - Discussed benign nature - Observe - Call for any changes  Actinic Damage - Chronic condition, secondary to cumulative UV/sun exposure - diffuse scaly erythematous macules with underlying dyspigmentation - Recommend daily broad spectrum sunscreen SPF 30+ to sun-exposed areas, reapply every 2 hours as needed.  - Staying in the shade or wearing long sleeves, sun glasses (UVA+UVB protection) and wide brim hats (4-inch brim around the entire circumference of the hat) are also recommended for sun protection.  - Call for new or changing lesions.  Skin cancer screening performed today.  AK (actinic keratosis) (17) Scalp x 1, left ear x 1, face x 15  Destruction of lesion - Scalp x 1, left ear x 1, face x 15 Complexity: simple   Destruction method: cryotherapy   Informed consent: discussed and consent obtained   Timeout:  patient name, date of birth, surgical site, and procedure verified Lesion destroyed using liquid nitrogen: Yes   Region frozen until ice ball extended beyond lesion: Yes   Outcome: patient tolerated procedure well with no complications   Post-procedure details: wound care instructions given    Actinic Damage with PreCancerous Actinic Keratoses Counseling for Topical Chemotherapy Management: Patient exhibits: - Severe, confluent actinic changes with pre-cancerous actinic keratoses that is secondary to cumulative UV radiation exposure over time - Condition that is severe; chronic, not at goal. - diffuse scaly erythematous macules and papules with underlying dyspigmentation - Discussed Prescription "Field Treatment" topical Chemotherapy for  Severe, Chronic Confluent Actinic Changes with Pre-Cancerous Actinic Keratoses Field treatment involves treatment of an entire area of skin that has confluent Actinic Changes (Sun/ Ultraviolet light damage) and PreCancerous Actinic Keratoses by method of PhotoDynamic  Therapy (PDT) and/or prescription Topical Chemotherapy agents such as 5-fluorouracil, 5-fluorouracil/calcipotriene, and/or imiquimod.  The purpose is to decrease the number of clinically evident and subclinical PreCancerous lesions to prevent progression to development of skin cancer by chemically destroying early precancer changes that may or may not be visible.  It has been shown to reduce the risk of developing skin cancer in the treated area. As a result of treatment, redness, scaling, crusting, and open sores may occur during treatment course. One or more than one of these methods may be used and may have to be used several times to control, suppress and eliminate the PreCancerous changes. Discussed treatment course, expected reaction, and possible side effects. - Recommend daily broad spectrum sunscreen SPF 30+ to sun-exposed areas, reapply every 2 hours as needed.  - Staying in the shade or wearing long sleeves, sun glasses (UVA+UVB protection) and wide brim hats (4-inch brim around the entire circumference of the hat) are also recommended. - Call for new or changing lesions.  Start 5FU/Calcipotriene bid for one week to face beginning in 4-6 weeks.  Return in about 3 months (around 09/20/2022) for AK follow up.  I, Ashok Cordia, CMA, am acting as scribe for [name], MD . Documentation: I have reviewed the above documentation for accuracy and completeness, and I agree with the above.  [name], MD

## 2022-06-20 NOTE — Patient Instructions (Signed)
Cryotherapy Aftercare  Wash gently with soap and water everyday.   Apply Vaseline and Band-Aid daily until healed.     Due to recent changes in healthcare laws, you may see results of your pathology and/or laboratory studies on MyChart before the doctors have had a chance to review them. We understand that in some cases there may be results that are confusing or concerning to you. Please understand that not all results are received at the same time and often the doctors may need to interpret multiple results in order to provide you with the best plan of care or course of treatment. Therefore, we ask that you please give us 2 business days to thoroughly review all your results before contacting the office for clarification. Should we see a critical lab result, you will be contacted sooner.   If You Need Anything After Your Visit  If you have any questions or concerns for your doctor, please call our main line at [phone] and press option 4 to reach your doctor's medical assistant. If no one answers, please leave a voicemail as directed and we will return your call as soon as possible. Messages left after 4 pm will be answered the following business day.   You may also send us a message via MyChart. We typically respond to MyChart messages within 1-2 business days.  For prescription refills, please ask your pharmacy to contact our office. Our fax number is [phone].  If you have an urgent issue when the clinic is closed that cannot wait until the next business day, you can page your doctor at the number below.    Please note that while we do our best to be available for urgent issues outside of office hours, we are not available 24/7.   If you have an urgent issue and are unable to reach us, you may choose to seek medical care at your doctor's office, retail clinic, urgent care center, or emergency room.  If you have a medical emergency, please immediately call 911 or go to the  emergency department.  Pager Numbers  - Dr. Kowalski: [phone]  - Dr. Moye: [phone]  - Dr. Stewart: [phone]  In the event of inclement weather, please call our main line at [phone] for an update on the status of any delays or closures.  Dermatology Medication Tips: Please keep the boxes that topical medications come in in order to help keep track of the instructions about where and how to use these. Pharmacies typically print the medication instructions only on the boxes and not directly on the medication tubes.   If your medication is too expensive, please contact our office at [phone] option 4 or send us a message through MyChart.   We are unable to tell what your co-pay for medications will be in advance as this is different depending on your insurance coverage. However, we may be able to find a substitute medication at lower cost or fill out paperwork to get insurance to cover a needed medication.   If a prior authorization is required to get your medication covered by your insurance company, please allow us 1-2 business days to complete this process.  Drug prices often vary depending on where the prescription is filled and some pharmacies may offer cheaper prices.  The website www.goodrx.com contains coupons for medications through different pharmacies. The prices here do not account for what the cost may be with help from insurance (it may be cheaper with your insurance), but the website can   give you the price if you did not use any insurance.  - You can print the associated coupon and take it with your prescription to the pharmacy.  - You may also stop by our office during regular business hours and pick up a GoodRx coupon card.  - If you need your prescription sent electronically to a different pharmacy, notify our office through [facility] MyChart or by phone at [phone] option 4.     Si Usted Necesita Algo Despus de Su Visita  Tambin puede  enviarnos un mensaje a travs de MyChart. Por lo general respondemos a los mensajes de MyChart en el transcurso de 1 a 2 das hbiles.  Para renovar recetas, por favor pida a su farmacia que se ponga en contacto con nuestra oficina. Nuestro nmero de fax es el [phone].  Si tiene un asunto urgente cuando la clnica est cerrada y que no puede esperar hasta el siguiente da hbil, puede llamar/localizar a su doctor(a) al nmero que aparece a continuacin.   Por favor, tenga en cuenta que aunque hacemos todo lo posible para estar disponibles para asuntos urgentes fuera del horario de oficina, no estamos disponibles las 24 horas del da, los 7 das de la semana.   Si tiene un problema urgente y no puede comunicarse con nosotros, puede optar por buscar atencin mdica  en el consultorio de su doctor(a), en una clnica privada, en un centro de atencin urgente o en una sala de emergencias.  Si tiene una emergencia mdica, por favor llame inmediatamente al 911 o vaya a la sala de emergencias.  Nmeros de bper  - Dr. Kowalski: [phone]  - Dra. Moye: [phone]  - Dra. Stewart: [phone]  En caso de inclemencias del tiempo, por favor llame a nuestra lnea principal al [phone] para una actualizacin sobre el estado de cualquier retraso o cierre.  Consejos para la medicacin en dermatologa: Por favor, guarde las cajas en las que vienen los medicamentos de uso tpico para ayudarle a seguir las instrucciones sobre dnde y cmo usarlos. Las farmacias generalmente imprimen las instrucciones del medicamento slo en las cajas y no directamente en los tubos del medicamento.   Si su medicamento es muy caro, por favor, pngase en contacto con nuestra oficina llamando al [phone] y presione la opcin 4 o envenos un mensaje a travs de MyChart.   No podemos decirle cul ser su copago por los medicamentos por adelantado ya que esto es diferente dependiendo de la cobertura de su seguro.  Sin embargo, es posible que podamos encontrar un medicamento sustituto a menor costo o llenar un formulario para que el seguro cubra el medicamento que se considera necesario.   Si se requiere una autorizacin previa para que su compaa de seguros cubra su medicamento, por favor permtanos de 1 a 2 das hbiles para completar este proceso.  Los precios de los medicamentos varan con frecuencia dependiendo del lugar de dnde se surte la receta y alguna farmacias pueden ofrecer precios ms baratos.  El sitio web www.goodrx.com tiene cupones para medicamentos de diferentes farmacias. Los precios aqu no tienen en cuenta lo que podra costar con la ayuda del seguro (puede ser ms barato con su seguro), pero el sitio web puede darle el precio si no utiliz ningn seguro.  - Puede imprimir el cupn correspondiente y llevarlo con su receta a la farmacia.  - Tambin puede pasar por nuestra oficina durante el horario de atencin regular y recoger una tarjeta de cupones de GoodRx.  -   Si necesita que su receta se enve electrnicamente a una farmacia diferente, informe a nuestra oficina a travs de MyChart de [facility] o por telfono llamando al [phone] y presione la opcin 4.  

## 2022-06-21 ENCOUNTER — Encounter: Payer: Self-pay | Admitting: Dermatology

## 2022-06-25 DIAGNOSIS — R791 Abnormal coagulation profile: Secondary | ICD-10-CM | POA: Diagnosis not present

## 2022-06-28 DIAGNOSIS — H903 Sensorineural hearing loss, bilateral: Secondary | ICD-10-CM | POA: Diagnosis not present

## 2022-07-09 DIAGNOSIS — G4733 Obstructive sleep apnea (adult) (pediatric): Secondary | ICD-10-CM | POA: Diagnosis not present

## 2022-07-09 DIAGNOSIS — I10 Essential (primary) hypertension: Secondary | ICD-10-CM | POA: Diagnosis not present

## 2022-07-10 DIAGNOSIS — R791 Abnormal coagulation profile: Secondary | ICD-10-CM | POA: Diagnosis not present

## 2022-07-17 DIAGNOSIS — G4733 Obstructive sleep apnea (adult) (pediatric): Secondary | ICD-10-CM | POA: Diagnosis not present

## 2022-07-17 DIAGNOSIS — N1832 Chronic kidney disease, stage 3b: Secondary | ICD-10-CM | POA: Diagnosis not present

## 2022-07-17 DIAGNOSIS — G4700 Insomnia, unspecified: Secondary | ICD-10-CM | POA: Diagnosis not present

## 2022-07-17 DIAGNOSIS — R791 Abnormal coagulation profile: Secondary | ICD-10-CM | POA: Diagnosis not present

## 2022-07-17 DIAGNOSIS — M545 Low back pain, unspecified: Secondary | ICD-10-CM | POA: Diagnosis not present

## 2022-07-17 DIAGNOSIS — M1A0710 Idiopathic chronic gout, right ankle and foot, without tophus (tophi): Secondary | ICD-10-CM | POA: Diagnosis not present

## 2022-07-17 DIAGNOSIS — I482 Chronic atrial fibrillation, unspecified: Secondary | ICD-10-CM | POA: Diagnosis not present

## 2022-07-17 DIAGNOSIS — G8929 Other chronic pain: Secondary | ICD-10-CM | POA: Diagnosis not present

## 2022-07-30 DIAGNOSIS — R791 Abnormal coagulation profile: Secondary | ICD-10-CM | POA: Diagnosis not present

## 2022-08-07 DIAGNOSIS — H61032 Chondritis of left external ear: Secondary | ICD-10-CM | POA: Diagnosis not present

## 2022-08-07 DIAGNOSIS — M109 Gout, unspecified: Secondary | ICD-10-CM | POA: Diagnosis not present

## 2022-08-07 DIAGNOSIS — C44299 Other specified malignant neoplasm of skin of left ear and external auricular canal: Secondary | ICD-10-CM | POA: Diagnosis not present

## 2022-08-07 DIAGNOSIS — R791 Abnormal coagulation profile: Secondary | ICD-10-CM | POA: Diagnosis not present

## 2022-08-08 DIAGNOSIS — G4733 Obstructive sleep apnea (adult) (pediatric): Secondary | ICD-10-CM | POA: Diagnosis not present

## 2022-08-08 DIAGNOSIS — I10 Essential (primary) hypertension: Secondary | ICD-10-CM | POA: Diagnosis not present

## 2022-08-09 LAB — SURGICAL PATHOLOGY

## 2022-08-13 DIAGNOSIS — G4733 Obstructive sleep apnea (adult) (pediatric): Secondary | ICD-10-CM | POA: Diagnosis not present

## 2022-08-14 DIAGNOSIS — R791 Abnormal coagulation profile: Secondary | ICD-10-CM | POA: Diagnosis not present

## 2022-08-22 DIAGNOSIS — R791 Abnormal coagulation profile: Secondary | ICD-10-CM | POA: Diagnosis not present

## 2022-08-23 ENCOUNTER — Encounter: Payer: Self-pay | Admitting: Urgent Care

## 2022-08-26 ENCOUNTER — Encounter: Payer: Self-pay | Admitting: Otolaryngology

## 2022-08-26 ENCOUNTER — Encounter
Admission: RE | Admit: 2022-08-26 | Discharge: 2022-08-26 | Disposition: A | Discharge status: Home / Self Care | Payer: No Typology Code available for payment source | Source: Ambulatory Visit | Attending: Otolaryngology | Admitting: Otolaryngology

## 2022-08-26 VITALS — Ht 67.0 in | Wt 225.0 lb

## 2022-08-26 DIAGNOSIS — I482 Chronic atrial fibrillation, unspecified: Secondary | ICD-10-CM

## 2022-08-26 DIAGNOSIS — Z7901 Long term (current) use of anticoagulants: Secondary | ICD-10-CM

## 2022-08-26 DIAGNOSIS — J449 Chronic obstructive pulmonary disease, unspecified: Secondary | ICD-10-CM

## 2022-08-26 DIAGNOSIS — Z01812 Encounter for preprocedural laboratory examination: Secondary | ICD-10-CM

## 2022-08-26 DIAGNOSIS — Z01810 Encounter for preprocedural cardiovascular examination: Secondary | ICD-10-CM

## 2022-08-26 DIAGNOSIS — R0609 Other forms of dyspnea: Secondary | ICD-10-CM

## 2022-08-26 HISTORY — DX: Pneumonia, unspecified organism: J18.9

## 2022-08-26 HISTORY — DX: Chronic kidney disease, stage 3b: N18.32

## 2022-08-26 HISTORY — DX: Unspecified hemorrhoids: K64.9

## 2022-08-26 HISTORY — DX: Dyspnea, unspecified: R06.00

## 2022-08-26 HISTORY — DX: Gastro-esophageal reflux disease without esophagitis: K21.9

## 2022-08-26 NOTE — Patient Instructions (Signed)
Your procedure is scheduled on:09-03-22 Tuesday Report to the Registration Desk on the 1st floor of the [facility].Then proceed to the 2nd floor Surgery Desk To find out your arrival time, please call [phone] between 1PM - 3PM on:09-02-22 Monday If your arrival time is 6:00 am, do not arrive before that time as the [facility] entrance doors do not open until 6:00 am.  REMEMBER: Instructions that are not followed completely may result in serious medical risk, up to and including death; or upon the discretion of your surgeon and anesthesiologist your surgery may need to be rescheduled.  Do not eat food after midnight the night before surgery.  No gum chewing or hard candies.  You may however, drink CLEAR liquids up to 2 hours before you are scheduled to arrive for your surgery. Do not drink anything within 2 hours of your scheduled arrival time.  Clear liquids include: - water  - apple juice without pulp - gatorade (not RED colors) - black coffee or tea (Do NOT add milk or creamers to the coffee or tea) Do NOT drink anything that is not on this list.  One week prior to surgery: Stop Anti-inflammatories (NSAIDS) such as Advil, Aleve, Ibuprofen, Motrin, Naproxen, Naprosyn and Aspirin based products such as Excedrin, Goody's Powder, BC Powder.You may however, take Tylenol if needed for pain up until the day of surgery. Stop ANY OVER THE COUNTER supplements/vitamins NOW (08-26-22) until after surgery.  Call Dr Radene Knee office today (08-26-22) to find out if/when warfarin (COUMADIN) needs to be stopped  TAKE ONLY THESE MEDICATIONS THE MORNING OF SURGERY WITH A SIP OF WATER: -buPROPion (WELLBUTRIN XL)  -sertraline (ZOLOFT)  -omeprazole (PRILOSEC)-take one the night before and one on the morning of surgery - helps to prevent nausea after surgery.)  No Alcohol for 24 hours before or after surgery.  No Smoking including e-cigarettes for 24 hours before surgery.  No chewable tobacco  products for at least 6 hours before surgery.  No nicotine patches on the day of surgery.  Do not use any "recreational" drugs for at least a week (preferably 2 weeks) before your surgery.  Please be advised that the combination of cocaine and anesthesia may have negative outcomes, up to and including death. If you test positive for cocaine, your surgery will be cancelled.  On the morning of surgery brush your teeth with toothpaste and water, you may rinse your mouth with mouthwash if you wish. Do not swallow any toothpaste or mouthwash.  Do not wear jewelry, make-up, hairpins, clips or nail polish.  Do not wear lotions, powders, or perfumes.   Do not shave body hair from the neck down 48 hours before surgery.  Contact lenses, hearing aids and dentures may not be worn into surgery.  Do not bring valuables to the hospital. [facility] is not responsible for any missing/lost belongings or valuables.   Bring your C-PAP to the hospital   Notify your doctor if there is any change in your medical condition (cold, fever, infection).  Wear comfortable clothing (specific to your surgery type) to the hospital.  After surgery, you can help prevent lung complications by doing breathing exercises.  Take deep breaths and cough every 1-2 hours. Your doctor may order a device called an Incentive Spirometer to help you take deep breaths. When coughing or sneezing, hold a pillow firmly against your incision with both hands. This is called "splinting." Doing this helps protect your incision. It also decreases belly discomfort.  If you  are being admitted to the hospital overnight, leave your suitcase in the car. After surgery it may be brought to your room.  In case of increased patient census, it may be necessary for you, the patient, to continue your postoperative care in the Same Day Surgery department.  If you are being discharged the day of surgery, you will not be allowed to drive home. You will  need a responsible individual to drive you home and stay with you for 24 hours after surgery.   If you are taking public transportation, you will need to have a responsible individual with you.  Please call the Pre-admissions Testing Dept. at [phone] if you have any questions about these instructions.  Surgery Visitation Policy:  Patients having surgery or a procedure may have two visitors.  Children under the age of 88 must have an adult with them who is not the patient.

## 2022-08-27 DIAGNOSIS — C44209 Unspecified malignant neoplasm of skin of left ear and external auricular canal: Secondary | ICD-10-CM | POA: Diagnosis not present

## 2022-08-27 DIAGNOSIS — R052 Subacute cough: Secondary | ICD-10-CM | POA: Diagnosis not present

## 2022-08-28 ENCOUNTER — Inpatient Hospital Stay: Admission: RE | Admit: 2022-08-28 | Payer: PPO | Source: Ambulatory Visit

## 2022-09-03 ENCOUNTER — Ambulatory Visit: Admit: 2022-09-03 | Payer: No Typology Code available for payment source | Admitting: Otolaryngology

## 2022-09-03 DIAGNOSIS — Z01812 Encounter for preprocedural laboratory examination: Secondary | ICD-10-CM

## 2022-09-03 DIAGNOSIS — I482 Chronic atrial fibrillation, unspecified: Secondary | ICD-10-CM

## 2022-09-03 DIAGNOSIS — Z7901 Long term (current) use of anticoagulants: Secondary | ICD-10-CM

## 2022-09-03 DIAGNOSIS — N183 Chronic kidney disease, stage 3 unspecified: Secondary | ICD-10-CM

## 2022-09-03 HISTORY — DX: Long term (current) use of anticoagulants: Z79.01

## 2022-09-03 HISTORY — DX: Obstructive sleep apnea (adult) (pediatric): G47.33

## 2022-09-03 SURGERY — EXCISION, CYST, EAR
Anesthesia: General | Laterality: Left

## 2022-09-08 DIAGNOSIS — G4733 Obstructive sleep apnea (adult) (pediatric): Secondary | ICD-10-CM | POA: Diagnosis not present

## 2022-09-08 DIAGNOSIS — I10 Essential (primary) hypertension: Secondary | ICD-10-CM | POA: Diagnosis not present

## 2022-09-09 DIAGNOSIS — C44209 Unspecified malignant neoplasm of skin of left ear and external auricular canal: Secondary | ICD-10-CM | POA: Diagnosis not present

## 2022-09-09 DIAGNOSIS — D0422 Carcinoma in situ of skin of left ear and external auricular canal: Secondary | ICD-10-CM | POA: Diagnosis not present

## 2022-09-11 DIAGNOSIS — I482 Chronic atrial fibrillation, unspecified: Secondary | ICD-10-CM | POA: Diagnosis not present

## 2022-09-11 DIAGNOSIS — I340 Nonrheumatic mitral (valve) insufficiency: Secondary | ICD-10-CM | POA: Diagnosis not present

## 2022-09-11 DIAGNOSIS — E782 Mixed hyperlipidemia: Secondary | ICD-10-CM | POA: Diagnosis not present

## 2022-09-11 DIAGNOSIS — R001 Bradycardia, unspecified: Secondary | ICD-10-CM | POA: Diagnosis not present

## 2022-09-11 DIAGNOSIS — Z7901 Long term (current) use of anticoagulants: Secondary | ICD-10-CM | POA: Diagnosis not present

## 2022-09-11 DIAGNOSIS — N1832 Chronic kidney disease, stage 3b: Secondary | ICD-10-CM | POA: Diagnosis not present

## 2022-09-11 DIAGNOSIS — I10 Essential (primary) hypertension: Secondary | ICD-10-CM | POA: Diagnosis not present

## 2022-09-20 DIAGNOSIS — N1832 Chronic kidney disease, stage 3b: Secondary | ICD-10-CM | POA: Diagnosis not present

## 2022-09-20 DIAGNOSIS — R58 Hemorrhage, not elsewhere classified: Secondary | ICD-10-CM | POA: Diagnosis not present

## 2022-09-20 DIAGNOSIS — Z7901 Long term (current) use of anticoagulants: Secondary | ICD-10-CM | POA: Diagnosis not present

## 2022-09-26 ENCOUNTER — Ambulatory Visit: Payer: PPO | Admitting: Dermatology

## 2022-09-26 DIAGNOSIS — I340 Nonrheumatic mitral (valve) insufficiency: Secondary | ICD-10-CM | POA: Diagnosis not present

## 2022-09-26 DIAGNOSIS — I482 Chronic atrial fibrillation, unspecified: Secondary | ICD-10-CM | POA: Diagnosis not present

## 2022-10-03 DIAGNOSIS — I480 Paroxysmal atrial fibrillation: Secondary | ICD-10-CM | POA: Diagnosis not present

## 2022-10-08 DIAGNOSIS — G4733 Obstructive sleep apnea (adult) (pediatric): Secondary | ICD-10-CM | POA: Diagnosis not present

## 2022-10-08 DIAGNOSIS — I10 Essential (primary) hypertension: Secondary | ICD-10-CM | POA: Diagnosis not present

## 2022-10-11 DIAGNOSIS — H6123 Impacted cerumen, bilateral: Secondary | ICD-10-CM | POA: Diagnosis not present

## 2022-10-11 DIAGNOSIS — C44229 Squamous cell carcinoma of skin of left ear and external auricular canal: Secondary | ICD-10-CM | POA: Diagnosis not present

## 2022-10-11 DIAGNOSIS — H903 Sensorineural hearing loss, bilateral: Secondary | ICD-10-CM | POA: Diagnosis not present

## 2022-10-24 DIAGNOSIS — I482 Chronic atrial fibrillation, unspecified: Secondary | ICD-10-CM | POA: Diagnosis not present

## 2022-10-31 DIAGNOSIS — I480 Paroxysmal atrial fibrillation: Secondary | ICD-10-CM | POA: Diagnosis not present

## 2022-11-08 DIAGNOSIS — I10 Essential (primary) hypertension: Secondary | ICD-10-CM | POA: Diagnosis not present

## 2022-11-08 DIAGNOSIS — G4733 Obstructive sleep apnea (adult) (pediatric): Secondary | ICD-10-CM | POA: Diagnosis not present

## 2022-11-18 DIAGNOSIS — G4733 Obstructive sleep apnea (adult) (pediatric): Secondary | ICD-10-CM | POA: Diagnosis not present

## 2022-12-02 DIAGNOSIS — I480 Paroxysmal atrial fibrillation: Secondary | ICD-10-CM | POA: Diagnosis not present

## 2022-12-09 DIAGNOSIS — G4733 Obstructive sleep apnea (adult) (pediatric): Secondary | ICD-10-CM | POA: Diagnosis not present

## 2022-12-09 DIAGNOSIS — I10 Essential (primary) hypertension: Secondary | ICD-10-CM | POA: Diagnosis not present

## 2022-12-16 ENCOUNTER — Other Ambulatory Visit: Payer: Self-pay

## 2022-12-16 ENCOUNTER — Emergency Department: Payer: No Typology Code available for payment source

## 2022-12-16 ENCOUNTER — Encounter: Payer: Self-pay | Admitting: Emergency Medicine

## 2022-12-16 ENCOUNTER — Inpatient Hospital Stay
Admission: EM | Admit: 2022-12-16 | Discharge: 2022-12-20 | DRG: 193 | Disposition: A | Discharge status: Home Health | Payer: No Typology Code available for payment source | Attending: Internal Medicine | Admitting: Internal Medicine

## 2022-12-16 DIAGNOSIS — R590 Localized enlarged lymph nodes: Secondary | ICD-10-CM | POA: Diagnosis present

## 2022-12-16 DIAGNOSIS — K5900 Constipation, unspecified: Secondary | ICD-10-CM | POA: Diagnosis present

## 2022-12-16 DIAGNOSIS — N183 Chronic kidney disease, stage 3 unspecified: Secondary | ICD-10-CM | POA: Diagnosis not present

## 2022-12-16 DIAGNOSIS — F32A Depression, unspecified: Secondary | ICD-10-CM | POA: Diagnosis not present

## 2022-12-16 DIAGNOSIS — I5031 Acute diastolic (congestive) heart failure: Secondary | ICD-10-CM | POA: Diagnosis present

## 2022-12-16 DIAGNOSIS — I712 Thoracic aortic aneurysm, without rupture, unspecified: Secondary | ICD-10-CM | POA: Diagnosis present

## 2022-12-16 DIAGNOSIS — R1013 Epigastric pain: Principal | ICD-10-CM

## 2022-12-16 DIAGNOSIS — Z66 Do not resuscitate: Secondary | ICD-10-CM | POA: Diagnosis present

## 2022-12-16 DIAGNOSIS — R7989 Other specified abnormal findings of blood chemistry: Secondary | ICD-10-CM | POA: Diagnosis not present

## 2022-12-16 DIAGNOSIS — Z1152 Encounter for screening for COVID-19: Secondary | ICD-10-CM

## 2022-12-16 DIAGNOSIS — A419 Sepsis, unspecified organism: Secondary | ICD-10-CM | POA: Diagnosis present

## 2022-12-16 DIAGNOSIS — G9341 Metabolic encephalopathy: Secondary | ICD-10-CM | POA: Diagnosis present

## 2022-12-16 DIAGNOSIS — I482 Chronic atrial fibrillation, unspecified: Secondary | ICD-10-CM | POA: Diagnosis present

## 2022-12-16 DIAGNOSIS — K5730 Diverticulosis of large intestine without perforation or abscess without bleeding: Secondary | ICD-10-CM | POA: Diagnosis not present

## 2022-12-16 DIAGNOSIS — N323 Diverticulum of bladder: Secondary | ICD-10-CM | POA: Diagnosis not present

## 2022-12-16 DIAGNOSIS — J189 Pneumonia, unspecified organism: Secondary | ICD-10-CM | POA: Diagnosis not present

## 2022-12-16 DIAGNOSIS — G4733 Obstructive sleep apnea (adult) (pediatric): Secondary | ICD-10-CM | POA: Diagnosis present

## 2022-12-16 DIAGNOSIS — R591 Generalized enlarged lymph nodes: Secondary | ICD-10-CM | POA: Diagnosis not present

## 2022-12-16 DIAGNOSIS — I7121 Aneurysm of the ascending aorta, without rupture: Secondary | ICD-10-CM | POA: Diagnosis not present

## 2022-12-16 DIAGNOSIS — I272 Pulmonary hypertension, unspecified: Secondary | ICD-10-CM | POA: Diagnosis not present

## 2022-12-16 DIAGNOSIS — R2243 Localized swelling, mass and lump, lower limb, bilateral: Secondary | ICD-10-CM | POA: Diagnosis not present

## 2022-12-16 DIAGNOSIS — Z87891 Personal history of nicotine dependence: Secondary | ICD-10-CM

## 2022-12-16 DIAGNOSIS — Z23 Encounter for immunization: Secondary | ICD-10-CM | POA: Diagnosis not present

## 2022-12-16 DIAGNOSIS — N401 Enlarged prostate with lower urinary tract symptoms: Secondary | ICD-10-CM | POA: Diagnosis present

## 2022-12-16 DIAGNOSIS — E669 Obesity, unspecified: Secondary | ICD-10-CM | POA: Diagnosis present

## 2022-12-16 DIAGNOSIS — Z833 Family history of diabetes mellitus: Secondary | ICD-10-CM

## 2022-12-16 DIAGNOSIS — Z79899 Other long term (current) drug therapy: Secondary | ICD-10-CM

## 2022-12-16 DIAGNOSIS — J849 Interstitial pulmonary disease, unspecified: Secondary | ICD-10-CM | POA: Diagnosis not present

## 2022-12-16 DIAGNOSIS — Z86008 Personal history of in-situ neoplasm of other site: Secondary | ICD-10-CM

## 2022-12-16 DIAGNOSIS — K219 Gastro-esophageal reflux disease without esophagitis: Secondary | ICD-10-CM | POA: Diagnosis present

## 2022-12-16 DIAGNOSIS — I161 Hypertensive emergency: Secondary | ICD-10-CM | POA: Diagnosis present

## 2022-12-16 DIAGNOSIS — Z6835 Body mass index (BMI) 35.0-35.9, adult: Secondary | ICD-10-CM

## 2022-12-16 DIAGNOSIS — R079 Chest pain, unspecified: Secondary | ICD-10-CM | POA: Diagnosis not present

## 2022-12-16 DIAGNOSIS — E785 Hyperlipidemia, unspecified: Secondary | ICD-10-CM | POA: Diagnosis present

## 2022-12-16 DIAGNOSIS — Z8249 Family history of ischemic heart disease and other diseases of the circulatory system: Secondary | ICD-10-CM

## 2022-12-16 DIAGNOSIS — E871 Hypo-osmolality and hyponatremia: Secondary | ICD-10-CM | POA: Diagnosis present

## 2022-12-16 DIAGNOSIS — I130 Hypertensive heart and chronic kidney disease with heart failure and stage 1 through stage 4 chronic kidney disease, or unspecified chronic kidney disease: Secondary | ICD-10-CM | POA: Diagnosis not present

## 2022-12-16 DIAGNOSIS — J440 Chronic obstructive pulmonary disease with acute lower respiratory infection: Secondary | ICD-10-CM | POA: Diagnosis present

## 2022-12-16 DIAGNOSIS — Z7901 Long term (current) use of anticoagulants: Secondary | ICD-10-CM | POA: Diagnosis not present

## 2022-12-16 DIAGNOSIS — R338 Other retention of urine: Secondary | ICD-10-CM | POA: Diagnosis present

## 2022-12-16 DIAGNOSIS — R918 Other nonspecific abnormal finding of lung field: Secondary | ICD-10-CM | POA: Diagnosis not present

## 2022-12-16 DIAGNOSIS — Z85828 Personal history of other malignant neoplasm of skin: Secondary | ICD-10-CM

## 2022-12-16 DIAGNOSIS — E782 Mixed hyperlipidemia: Secondary | ICD-10-CM | POA: Diagnosis not present

## 2022-12-16 DIAGNOSIS — N1832 Chronic kidney disease, stage 3b: Secondary | ICD-10-CM | POA: Diagnosis present

## 2022-12-16 DIAGNOSIS — M109 Gout, unspecified: Secondary | ICD-10-CM | POA: Diagnosis present

## 2022-12-16 DIAGNOSIS — I495 Sick sinus syndrome: Secondary | ICD-10-CM | POA: Diagnosis present

## 2022-12-16 DIAGNOSIS — I160 Hypertensive urgency: Secondary | ICD-10-CM | POA: Diagnosis not present

## 2022-12-16 DIAGNOSIS — R0600 Dyspnea, unspecified: Secondary | ICD-10-CM | POA: Diagnosis not present

## 2022-12-16 DIAGNOSIS — R778 Other specified abnormalities of plasma proteins: Secondary | ICD-10-CM | POA: Diagnosis not present

## 2022-12-16 DIAGNOSIS — M1A0790 Idiopathic chronic gout, unspecified ankle and foot, without tophus (tophi): Secondary | ICD-10-CM | POA: Diagnosis present

## 2022-12-16 LAB — COMPREHENSIVE METABOLIC PANEL
ALT: 23 U/L (ref 0–44)
AST: 36 U/L (ref 15–41)
Albumin: 4 g/dL (ref 3.5–5.0)
Alkaline Phosphatase: 69 U/L (ref 38–126)
Anion gap: 12 (ref 5–15)
BUN: 38 mg/dL — ABNORMAL HIGH (ref 8–23)
CO2: 22 mmol/L (ref 22–32)
Calcium: 9.2 mg/dL (ref 8.9–10.3)
Chloride: 101 mmol/L (ref 98–111)
Creatinine, Ser: 1.87 mg/dL — ABNORMAL HIGH (ref 0.61–1.24)
GFR, Estimated: 35 mL/min — ABNORMAL LOW (ref >60)
Glucose, Bld: 109 mg/dL — ABNORMAL HIGH (ref 70–99)
Potassium: 4.1 mmol/L (ref 3.5–5.1)
Sodium: 135 mmol/L (ref 135–145)
Total Bilirubin: 1 mg/dL (ref 0.3–1.2)
Total Protein: 7.8 g/dL (ref 6.5–8.1)

## 2022-12-16 LAB — URINALYSIS, ROUTINE W REFLEX MICROSCOPIC
Bacteria, UA: NONE SEEN
Bilirubin Urine: NEGATIVE
Glucose, UA: NEGATIVE mg/dL
Ketones, ur: 5 mg/dL — AB
Leukocytes,Ua: NEGATIVE
Nitrite: NEGATIVE
Protein, ur: >=300 mg/dL — AB
Specific Gravity, Urine: 1.012 (ref 1.005–1.030)
pH: 5 (ref 5.0–8.0)

## 2022-12-16 LAB — CBC
HCT: 42.5 % (ref 39.0–52.0)
Hemoglobin: 13.7 g/dL (ref 13.0–17.0)
MCH: 28.6 pg (ref 26.0–34.0)
MCHC: 32.2 g/dL (ref 30.0–36.0)
MCV: 88.7 fL (ref 80.0–100.0)
Platelets: 255 10*3/uL (ref 150–400)
RBC: 4.79 MIL/uL (ref 4.22–5.81)
RDW: 15 % (ref 11.5–15.5)
WBC: 11.3 10*3/uL — ABNORMAL HIGH (ref 4.0–10.5)
nRBC: 0 % (ref 0.0–0.2)

## 2022-12-16 LAB — RESP PANEL BY RT-PCR (RSV, FLU A&B, COVID)  RVPGX2
Influenza A by PCR: NEGATIVE
Influenza B by PCR: NEGATIVE
Resp Syncytial Virus by PCR: NEGATIVE
SARS Coronavirus 2 by RT PCR: NEGATIVE

## 2022-12-16 LAB — TROPONIN I (HIGH SENSITIVITY)
Troponin I (High Sensitivity): 34 ng/L — ABNORMAL HIGH (ref <18)
Troponin I (High Sensitivity): 43 ng/L — ABNORMAL HIGH (ref <18)

## 2022-12-16 LAB — LIPASE, BLOOD: Lipase: 143 U/L — ABNORMAL HIGH (ref 11–51)

## 2022-12-16 MED ORDER — ASPIRIN 325 MG PO TABS
325.0000 mg | ORAL_TABLET | Freq: Once | ORAL | Status: AC
  Administered 2022-12-16: 325 mg via ORAL
  Filled 2022-12-16: qty 1

## 2022-12-16 MED ORDER — SODIUM CHLORIDE 0.9 % IV SOLN
2.0000 g | Freq: Once | INTRAVENOUS | Status: AC
  Administered 2022-12-17: 2 g via INTRAVENOUS
  Filled 2022-12-16: qty 20

## 2022-12-16 MED ORDER — NICARDIPINE HCL IN NACL 20-0.86 MG/200ML-% IV SOLN
3.0000 mg/h | INTRAVENOUS | Status: DC
  Administered 2022-12-16: 5 mg/h via INTRAVENOUS
  Filled 2022-12-16: qty 200

## 2022-12-16 MED ORDER — MORPHINE SULFATE (PF) 4 MG/ML IV SOLN
4.0000 mg | Freq: Once | INTRAVENOUS | Status: AC
  Administered 2022-12-16: 4 mg via INTRAVENOUS
  Filled 2022-12-16: qty 1

## 2022-12-16 MED ORDER — IOHEXOL 350 MG/ML SOLN
75.0000 mL | Freq: Once | INTRAVENOUS | Status: AC | PRN
  Administered 2022-12-16: 75 mL via INTRAVENOUS

## 2022-12-16 MED ORDER — ASPIRIN 325 MG PO TABS: 325.0000 mg | ORAL_TABLET | Freq: Every day | ORAL | Status: DC

## 2022-12-16 MED ORDER — SODIUM CHLORIDE 0.9 % IV SOLN: 500.0000 mg | Freq: Once | INTRAVENOUS | Status: DC

## 2022-12-16 NOTE — ED Provider Notes (Signed)
[facility] Provider Note    Event Date/Time   First MD Initiated Contact with Patient 12/16/22 2022     (approximate)   History   Abdominal Pain   HPI  Craig Gilmore is a 88 year old male with history of AFib presenting to the emergency department for evaluation of abdominal pain.  Over the past few days, patient has had upper abdominal pain with radiation to his back, got acutely worse a few hours ago.  Initially presented to urgent care and was directed to the ER given significantly elevated blood pressure.  Denies nausea, vomiting, diarrhea, shortness of breath.  Denies history of similar.     Physical Exam   Triage Vital Signs: ED Triage Vitals  Encounter Vitals Group     BP 12/16/22 1904 (!) 210/99     Systolic BP Percentile --      Diastolic BP Percentile --      Pulse Rate 12/16/22 1904 98     Resp 12/16/22 1904 18     Temp 12/16/22 1904 97.9 F (36.6 C)     Temp Source 12/16/22 1904 Oral     SpO2 12/16/22 1904 98 %     Weight 12/16/22 1906 227 lb 8.2 oz (103.2 kg)     Height 12/16/22 1906 5\' 7"  (1.702 m)     Head Circumference --      Peak Flow --      Pain Score 12/16/22 1906 4     Pain Loc --      Pain Education --      Exclude from Growth Chart --     Most recent vital signs: Vitals:   12/16/22 2230 12/16/22 2300  BP: (!) 157/87 (!) 149/98  Pulse: 95 94  Resp: 18 19  Temp:    SpO2: 96% 94%     General: Awake, interactive  CV:  Regular rate, good peripheral perfusion.  Resp:  Lungs clear, unlabored respirations.  Abd:  Soft, nondistended, tender to palpation in the upper abdomen without rebound or guarding Neuro:  Symmetric facial movement, fluid speech, 2+ radial and DP pulses bilaterally, moving extremity spontaneously and equally   ED Results / Procedures / Treatments   Labs (all labs ordered are listed, but only abnormal results are displayed) Labs Reviewed  LIPASE, BLOOD - Abnormal; Notable for the following  components:      Result Value   Lipase 143 (*)    All other components within normal limits  COMPREHENSIVE METABOLIC PANEL - Abnormal; Notable for the following components:   Glucose, Bld 109 (*)    BUN 38 (*)    Creatinine, Ser 1.87 (*)    GFR, Estimated 35 (*)    All other components within normal limits  CBC - Abnormal; Notable for the following components:   WBC 11.3 (*)    All other components within normal limits  URINALYSIS, ROUTINE W REFLEX MICROSCOPIC - Abnormal; Notable for the following components:   Color, Urine YELLOW (*)    APPearance CLEAR (*)    Hgb urine dipstick MODERATE (*)    Ketones, ur 5 (*)    Protein, ur >=300 (*)    All other components within normal limits  TROPONIN I (HIGH SENSITIVITY) - Abnormal; Notable for the following components:   Troponin I (High Sensitivity) 34 (*)    All other components within normal limits  TROPONIN I (HIGH SENSITIVITY) - Abnormal; Notable for the following components:   Troponin I (High Sensitivity) 43 (*)  All other components within normal limits  RESP PANEL BY RT-PCR (RSV, FLU A&B, COVID)  [id number]     EKG EKG independently reviewed interpreted by myself (ER attending) demonstrates:  EKG demonstrates A-fib at a rate of 92, QRS 86, QTc 430, no acute ST changes  RADIOLOGY Imaging independently reviewed and interpreted by myself demonstrates:  Chest x-Encarnacion Bole on my review concerning for widened mediastinum, bilateral infiltrates noted, per radiology possibly infectious or inflammatory CTA of the chest without visible dissection on my review, no obvious focal consolidation  PROCEDURES:  Critical Care performed: Yes, see critical care procedure note(s)  CRITICAL CARE Performed by: Trinna Post   Total critical care time: 32 minutes  Critical care time was exclusive of separately billable procedures and treating other patients.  Critical care was necessary to treat or prevent imminent or life-threatening  deterioration.  Critical care was time spent personally by me on the following activities: development of treatment plan with patient and/or surrogate as well as nursing, discussions with consultants, evaluation of patient's response to treatment, examination of patient, obtaining history from patient or surrogate, ordering and performing treatments and interventions, ordering and review of laboratory studies, ordering and review of radiographic studies, pulse oximetry and re-evaluation of patient's condition.   Procedures   MEDICATIONS ORDERED IN ED: Medications  nicardipine (CARDENE) 20mg  in 0.86% saline IV infusion (0.1 mg/ml) (3 mg/hr Intravenous Restarted 12/16/22 2137)  morphine (PF) 4 MG/ML injection 4 mg (4 mg Intravenous Given 12/16/22 2114)  iohexol (OMNIPAQUE) 350 MG/ML injection 75 mL (75 mLs Intravenous Contrast Given 12/16/22 2126)  aspirin tablet 325 mg (325 mg Oral Given 12/16/22 2255)     IMPRESSION / MDM / ASSESSMENT AND PLAN / ED COURSE  I reviewed the triage vital signs and the nursing notes.  Differential diagnosis includes, but is not limited to, aortic dissection, hypertensive emergency, ACS, pancreatitis, other acute intra-abdominal process  Patient's presentation is most consistent with acute presentation with potential threat to life or bodily function.  88 year old male presenting with epigastric pain radiating to the back found to be significantly hypertensive on presentation.  X-Johnthan Axtman with widened mediastinum on my review, I am concerned about an aortic dissection.  CT dissection protocol was ordered, radiology read pending, but no obvious dissection on my review.  Patient was placed on a Cardene drip.  He did have a rapid drop in his blood pressure and this was paused, but had rapid recurrence of hypertension so was restarted at a low dose.  Labs notable for elevated troponin 34 increased to 43 on repeat.  Lipase elevated at 143.  Urinalysis without evidence of  infection.  Given significant hypertension with elevated troponin, do think patient will require admission for further evaluation.  He was ordered for aspirin.  He is currently on a Cardene drip, but if radiology confirms no dissection, may be reasonable to transition to oral medications.  Signed out to oncoming provider at 2300 pending radiology reading and disposition.      FINAL CLINICAL IMPRESSION(S) / ED DIAGNOSES   Final diagnoses:  Epigastric pain  Hypertensive emergency  Elevated troponin     Rx / DC Orders   ED Discharge Orders     None        Note:  This document was prepared using Dragon voice recognition software and may include unintentional dictation errors.   Trinna Post, MD 12/16/22 2329

## 2022-12-16 NOTE — H&P (Incomplete)
History and Physical    Patient: Craig Gilmore ZOX:[id number] DOB: March 12, 1935 DOA: 12/16/2022 DOS: the patient was seen and examined on 12/16/2022 PCP: Jerl Mina, MD  Patient coming from: Home  Chief Complaint:  Chief Complaint  Patient presents with  . Abdominal Pain   HPI: Craig Gilmore is a 88 y.o. male with medical history significant of chronic atrial fibrillation on warfarin, COPD, depression, GERD, sick sinus syndrome, gout, who presented the ER today with abdominal pain.  Patient has been having upper abdominal pain with radiation to his back.  This has gotten worse in the last few hours.  Was initially seen in urgent care as directed to the ER.  Patient was found to have markedly elevated blood pressure.  Denied any fever or chills.  Denied any nausea vomiting or diarrhea.  Patient's workup included CT angiogram of the chest that showed no PE with thoracic aortic aneurysm and pneumonia.  Patient also meets sepsis criteria in addition to the hypertensive emergency.  Initiated on Cardene drip in the ER and medicine consulted for admission.  Review of Systems: As mentioned in the history of present illness. All other systems reviewed and are negative. Past Medical History:  Diagnosis Date  . Chronic atrial fibrillation (HCC)    a.) CHA2DS2VASc = 3 (age x 2, HTN);  b.) s/p DCCV 08/20/2004 --> 50 J x 1 and 100 J x 1 --> coverted with SB. (+) post-procedure nausea; c.) rate/rhythm maintained without pharmacological intervention; chronically anticoagulated with warfarin  . Colonic polyp   . COPD (chronic obstructive pulmonary disease) (HCC)   . Depression   . Dyspnea   . Edema   . GERD (gastroesophageal reflux disease)   . Gout   . Hemorrhoids   . Hyperlipidemia   . Hypertension   . Insomnia    a.) on hypnotic (zolpidem)  . Long term current use of anticoagulant    a.) warfarin  . OSA on CPAP   . Pneumonia   . Pulmonary HTN (HCC) 02/14/2016   a.) TTE 02/14/2016: RVSP 37.5 mmHg   . Sick sinus syndrome (HCC)   . Squamous cell carcinoma in situ 10/03/2009   L medial infrapectoral  . 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   L nose supratip  . Stage 3b chronic kidney disease (CKD) (HCC)    Past Surgical History:  Procedure Laterality Date  . CARDIOVERSION N/A 08/21/2004   Procedure: CIRECT CURRENT CARDIOVERSION; Location: [facility]; Surgeon: Harold Hedge, MD  . CATARACT EXTRACTION, BILATERAL    . CHOLECYSTECTOMY    . COLONOSCOPY    . NASAL SINUS SURGERY     Social History:  reports that he quit smoking about 33 years ago. His smoking use included cigarettes. He started smoking about 83 years ago. He has a 75 pack-year smoking history. He has never used smokeless tobacco. He reports that he does not drink alcohol and does not use drugs.  Allergies  Allergen Reactions  . Baycol [Cerivastatin]   . Pravastatin     Family History  Problem Relation Age of Onset  . Diabetes Mother   . Heart disease Mother   . Heart disease Father   . Aneurysm Father     Prior to Admission medications   Medication Sig Start Date End Date Taking? Authorizing Provider  buPROPion (WELLBUTRIN XL) 150 MG 24 hr tablet Take 75 mg by mouth every morning.    [provider]  Choline Fenofibrate (FENOFIBRIC ACID) 135 MG CPDR Take 1 capsule by  mouth  daily Patient taking differently: Take 1 tablet by mouth every evening. Take 1 capsule by mouth  daily 05/18/15   Iran Ouch, MD  febuxostat (ULORIC) 40 MG tablet Take 40 mg by mouth every evening.    [provider]  fluticasone (FLONASE) 50 MCG/ACT nasal spray Place 1 spray into both nostrils at bedtime.    [provider]  ipratropium (ATROVENT) 0.03 % nasal spray Place 2 sprays into both nostrils every morning.    [provider]  levocetirizine (XYZAL) 5 MG tablet Take 5 mg by mouth every evening.    [provider]  omeprazole (PRILOSEC OTC) 20 MG tablet Take 20 mg by mouth every morning.     [provider]  sertraline (ZOLOFT) 100 MG tablet Take 150 mg by mouth every morning.    [provider]  warfarin (COUMADIN) 2.5 MG tablet Take as directed by anticoagulation clinic Patient taking differently: Take 2.5 mg by mouth every evening. Take as directed by anticoagulation clinic 03/06/15   Iran Ouch, MD  zolpidem (AMBIEN) 10 MG tablet Take 10 mg by mouth at bedtime.    [provider]    Physical Exam: Vitals:   12/16/22 2207 12/16/22 2215 12/16/22 2230 12/16/22 2300  BP: (!) 147/90 (!) 165/98 (!) 157/87 (!) 149/98  Pulse: 92 94 95 94  Resp: (!) 21 12 18 19   Temp:      TempSrc:      SpO2: 95% 97% 96% 94%  Weight:      Height:       Constitutional: Acutely ill looking no distress NAD, calm, comfortable Eyes: PERRL, lids and conjunctivae normal ENMT: Mucous membranes are moist. Posterior pharynx clear of any exudate or lesions.Normal dentition.  Neck: normal, supple, no masses, no thyromegaly Respiratory: clear to auscultation bilaterally, no wheezing, no crackles. Normal respiratory effort. No accessory muscle use.  Cardiovascular: Regular rate and rhythm, no murmurs / rubs / gallops. No extremity edema. 2+ pedal pulses. No carotid bruits.  Abdomen: no tenderness, no masses palpated. No hepatosplenomegaly. Bowel sounds positive.  Musculoskeletal: Good range of motion, no joint swelling or tenderness, Skin: no rashes, lesions, ulcers. No induration Neurologic: CN 2-12 grossly intact. Sensation intact, DTR normal. Strength 5/5 in all 4.  Psychiatric: Normal judgment and insight. Alert and oriented x 3. Normal mood  Data Reviewed:  Blood pressure 224/145, pulse 111 respiratory 24 oxygen sat 94% on room air.  White count is 11.3, creatinine 1.87 BUN 38.  Initial troponin is 34 and then 43, urinalysis essentially negative.  Acute viral screen is negative.  CT angio of the chest showed multiple findings including no evidence of aortic  dissection but thoracic aortic aneurysm 4.3 x 3.7 cm.  Pathology mediastinal and right hilar lymphadenopathy, findings of interstitial lung disease and groundglass opacity in the right lower about suspicious for malignancy or pneumonia.  Assessment and Plan:  #1    Advance Care Planning:   Code Status: Do not attempt resuscitation (DNR) PRE-ARREST INTERVENTIONS DESIRED ***  Consults: ***  Family Communication: ***  Severity of Illness: {Observation/Inpatient:21159}  AuthorLonia Blood, MD 12/16/2022 11:56 PM  For on call review [email or web address].

## 2022-12-16 NOTE — ED Triage Notes (Signed)
Pt presents ambulatory to triage via POV with complaints of upper abdominal pain with radiation to his back and chest that started several hours ago. Pt was seen at [facility] and advised to come here due to his elevated BP. A&Ox4 at this time. Denies N/V/D, or SOB.

## 2022-12-16 NOTE — H&P (Signed)
History and Physical    Patient: Craig Gilmore GNF:[id number] DOB: Dec 29, 1934 DOA: 12/16/2022 DOS: the patient was seen and examined on 12/16/2022 PCP: Jerl Mina, MD  Patient coming from: Home  Chief Complaint:  Chief Complaint  Patient presents with   Abdominal Pain   HPI: Craig Gilmore is a 88 y.o. male with medical history significant of chronic atrial fibrillation on warfarin, COPD, depression, GERD, sick sinus syndrome, gout, who presented the ER today with abdominal pain.  Patient has been having upper abdominal pain with radiation to his back.  This has gotten worse in the last few hours.  Was initially seen in urgent care as directed to the ER.  Patient was found to have markedly elevated blood pressure.  Denied any fever or chills.  Denied any nausea vomiting or diarrhea.  Patient's workup included CT angiogram of the chest that showed no PE with thoracic aortic aneurysm and pneumonia.  Patient also meets sepsis criteria in addition to the hypertensive emergency.  Initiated on Cardene drip in the ER and medicine consulted for admission.  Review of Systems: As mentioned in the history of present illness. All other systems reviewed and are negative. Past Medical History:  Diagnosis Date   Chronic atrial fibrillation (HCC)    a.) CHA2DS2VASc = 3 (age x 2, HTN);  b.) s/p DCCV 08/20/2004 --> 50 J x 1 and 100 J x 1 --> coverted with SB. (+) post-procedure nausea; c.) rate/rhythm maintained without pharmacological intervention; chronically anticoagulated with warfarin   Colonic polyp    COPD (chronic obstructive pulmonary disease) (HCC)    Depression    Dyspnea    Edema    GERD (gastroesophageal reflux disease)    Gout    Hemorrhoids    Hyperlipidemia    Hypertension    Insomnia    a.) on hypnotic (zolpidem)   Long term current use of anticoagulant    a.) warfarin   OSA on CPAP    Pneumonia    Pulmonary HTN (HCC) 02/14/2016   a.) TTE 02/14/2016: RVSP 37.5 mmHg   Sick sinus  syndrome (HCC)    Squamous cell carcinoma in situ 10/03/2009   L medial infrapectoral   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   L nose supratip   Stage 3b chronic kidney disease (CKD) (HCC)    Past Surgical History:  Procedure Laterality Date   CARDIOVERSION N/A 08/21/2004   Procedure: CIRECT CURRENT CARDIOVERSION; Location: [facility]; Surgeon: Harold Hedge, MD   CATARACT EXTRACTION, BILATERAL     CHOLECYSTECTOMY     COLONOSCOPY     NASAL SINUS SURGERY     Social History:  reports that he quit smoking about 33 years ago. His smoking use included cigarettes. He started smoking about 83 years ago. He has a 75 pack-year smoking history. He has never used smokeless tobacco. He reports that he does not drink alcohol and does not use drugs.  Allergies  Allergen Reactions   Baycol [Cerivastatin]    Pravastatin     Family History  Problem Relation Age of Onset   Diabetes Mother    Heart disease Mother    Heart disease Father    Aneurysm Father     Prior to Admission medications   Medication Sig Start Date End Date Taking? Authorizing Provider  buPROPion (WELLBUTRIN XL) 150 MG 24 hr tablet Take 75 mg by mouth every morning.    [provider]  Choline Fenofibrate (FENOFIBRIC ACID) 135 MG CPDR Take 1 capsule by  mouth  daily Patient taking differently: Take 1 tablet by mouth every evening. Take 1 capsule by mouth  daily 05/18/15   Iran Ouch, MD  febuxostat (ULORIC) 40 MG tablet Take 40 mg by mouth every evening.    [provider]  fluticasone (FLONASE) 50 MCG/ACT nasal spray Place 1 spray into both nostrils at bedtime.    [provider]  ipratropium (ATROVENT) 0.03 % nasal spray Place 2 sprays into both nostrils every morning.    [provider]  levocetirizine (XYZAL) 5 MG tablet Take 5 mg by mouth every evening.    [provider]  omeprazole (PRILOSEC OTC) 20 MG tablet Take 20 mg by mouth every morning.    [provider]   sertraline (ZOLOFT) 100 MG tablet Take 150 mg by mouth every morning.    [provider]  warfarin (COUMADIN) 2.5 MG tablet Take as directed by anticoagulation clinic Patient taking differently: Take 2.5 mg by mouth every evening. Take as directed by anticoagulation clinic 03/06/15   Iran Ouch, MD  zolpidem (AMBIEN) 10 MG tablet Take 10 mg by mouth at bedtime.    [provider]    Physical Exam: Vitals:   12/16/22 2207 12/16/22 2215 12/16/22 2230 12/16/22 2300  BP: (!) 147/90 (!) 165/98 (!) 157/87 (!) 149/98  Pulse: 92 94 95 94  Resp: (!) 21 12 18 19   Temp:      TempSrc:      SpO2: 95% 97% 96% 94%  Weight:      Height:       Constitutional: Acutely ill looking no distress NAD, calm, comfortable Eyes: PERRL, lids and conjunctivae normal ENMT: Mucous membranes are moist. Posterior pharynx clear of any exudate or lesions.Normal dentition.  Neck: normal, supple, no masses, no thyromegaly Respiratory: Decreased air entry bilaterally with crackles, no distress, no wheezing, no crackles. Normal respiratory effort. No accessory muscle use.  Cardiovascular: Regular rate and rhythm, no murmurs / rubs / gallops. No extremity edema. 2+ pedal pulses. No carotid bruits.  Abdomen: no tenderness, no masses palpated. No hepatosplenomegaly. Bowel sounds positive.  Musculoskeletal: Good range of motion, no joint swelling or tenderness, Skin: no rashes, lesions, ulcers. No induration Neurologic: CN 2-12 grossly intact. Sensation intact, DTR normal. Strength 5/5 in all 4.  Psychiatric: Normal judgment and insight. Alert and oriented x 3. Normal mood  Data Reviewed:  Blood pressure 224/145, pulse 111 respiratory 24 oxygen sat 94% on room air.  White count is 11.3, creatinine 1.87 BUN 38.  Initial troponin is 34 and then 43, urinalysis essentially negative.  Acute viral screen is negative.  CT angio of the chest showed multiple findings including no evidence of aortic dissection  but thoracic aortic aneurysm 4.3 x 3.7 cm.  Pathology mediastinal and right hilar lymphadenopathy, findings of interstitial lung disease and groundglass opacity in the right lower about suspicious for malignancy or pneumonia.  Assessment and Plan:  #1 sepsis due to pneumonia: Patient will be admitted and initiated on the sepsis protocol.  Continue oxygen and antibiotics.  #2 hypertensive urgency: Patient on Cardene drip.  We will probably titrate this off.  Resume home regimen.  #3 chronic atrial fibrillation: On warfarin.  Will continue warfarin.  #4 hyperlipidemia: Continue statin  #5 chronic kidney disease stage IIIb: Appears stable.  #6 history of gout: On Uloric.  Continue    Advance Care Planning:   Code Status: Do not attempt resuscitation (DNR) PRE-ARREST INTERVENTIONS DESIRED   Consults: None  Family Communication: No family at bedside  Severity of Illness: The appropriate patient status for this patient is INPATIENT. Inpatient status is judged to be reasonable and necessary in order to provide the required intensity of service to ensure the patient's safety. The patient's presenting symptoms, physical exam findings, and initial radiographic and laboratory data in the context of their chronic comorbidities is felt to place them at high risk for further clinical deterioration. Furthermore, it is not anticipated that the patient will be medically stable for discharge from the hospital within 2 midnights of admission.   * I certify that at the point of admission it is my clinical judgment that the patient will require inpatient hospital care spanning beyond 2 midnights from the point of admission due to high intensity of service, high risk for further deterioration and high frequency of surveillance required.*  AuthorLonia Blood, MD 12/16/2022 11:56 PM  For on call review [email or web address].

## 2022-12-17 ENCOUNTER — Encounter: Payer: Self-pay | Admitting: Internal Medicine

## 2022-12-17 DIAGNOSIS — I7121 Aneurysm of the ascending aorta, without rupture: Secondary | ICD-10-CM

## 2022-12-17 DIAGNOSIS — N1832 Chronic kidney disease, stage 3b: Secondary | ICD-10-CM | POA: Diagnosis not present

## 2022-12-17 DIAGNOSIS — I160 Hypertensive urgency: Secondary | ICD-10-CM | POA: Diagnosis not present

## 2022-12-17 DIAGNOSIS — I495 Sick sinus syndrome: Secondary | ICD-10-CM

## 2022-12-17 DIAGNOSIS — I482 Chronic atrial fibrillation, unspecified: Secondary | ICD-10-CM | POA: Diagnosis not present

## 2022-12-17 DIAGNOSIS — J849 Interstitial pulmonary disease, unspecified: Secondary | ICD-10-CM | POA: Diagnosis not present

## 2022-12-17 LAB — COMPREHENSIVE METABOLIC PANEL
ALT: 18 U/L (ref 0–44)
AST: 29 U/L (ref 15–41)
Albumin: 3.3 g/dL — ABNORMAL LOW (ref 3.5–5.0)
Alkaline Phosphatase: 53 U/L (ref 38–126)
Anion gap: 10 (ref 5–15)
BUN: 36 mg/dL — ABNORMAL HIGH (ref 8–23)
CO2: 22 mmol/L (ref 22–32)
Calcium: 8.6 mg/dL — ABNORMAL LOW (ref 8.9–10.3)
Chloride: 103 mmol/L (ref 98–111)
Creatinine, Ser: 1.64 mg/dL — ABNORMAL HIGH (ref 0.61–1.24)
GFR, Estimated: 40 mL/min — ABNORMAL LOW (ref >60)
Glucose, Bld: 97 mg/dL (ref 70–99)
Potassium: 3.5 mmol/L (ref 3.5–5.1)
Sodium: 135 mmol/L (ref 135–145)
Total Bilirubin: 0.9 mg/dL (ref 0.3–1.2)
Total Protein: 6.4 g/dL — ABNORMAL LOW (ref 6.5–8.1)

## 2022-12-17 LAB — PROCALCITONIN: Procalcitonin: <0.1 ng/mL

## 2022-12-17 LAB — BLOOD CULTURE ID PANEL (REFLEXED) - BCID2

## 2022-12-17 LAB — CORTISOL-AM, BLOOD: Cortisol - AM: 10.2 ug/dL (ref 6.7–22.6)

## 2022-12-17 LAB — PROTIME-INR
INR: 2.6 — ABNORMAL HIGH (ref 0.8–1.2)
INR: 2.8 — ABNORMAL HIGH (ref 0.8–1.2)
Prothrombin Time: 27.7 s — ABNORMAL HIGH (ref 11.4–15.2)
Prothrombin Time: 29.5 s — ABNORMAL HIGH (ref 11.4–15.2)

## 2022-12-17 LAB — MRSA NEXT GEN BY PCR, NASAL: MRSA by PCR Next Gen: NOT DETECTED

## 2022-12-17 LAB — CBC
HCT: 39.2 % (ref 39.0–52.0)
Hemoglobin: 12.5 g/dL — ABNORMAL LOW (ref 13.0–17.0)
MCH: 29 pg (ref 26.0–34.0)
MCHC: 31.9 g/dL (ref 30.0–36.0)
MCV: 91 fL (ref 80.0–100.0)
Platelets: 222 10*3/uL (ref 150–400)
RBC: 4.31 MIL/uL (ref 4.22–5.81)
RDW: 14.9 % (ref 11.5–15.5)
WBC: 9.6 10*3/uL (ref 4.0–10.5)
nRBC: 0 % (ref 0.0–0.2)

## 2022-12-17 LAB — LACTIC ACID, PLASMA
Lactic Acid, Venous: 0.8 mmol/L (ref 0.5–1.9)
Lactic Acid, Venous: 1 mmol/L (ref 0.5–1.9)

## 2022-12-17 MED ORDER — FLUTICASONE PROPIONATE 50 MCG/ACT NA SUSP
1.0000 | NASAL | Status: DC
  Administered 2022-12-19: 1 via NASAL
  Filled 2022-12-17 (×2): qty 16

## 2022-12-17 MED ORDER — VANCOMYCIN HCL 750 MG/150ML IV SOLN
750.0000 mg | INTRAVENOUS | Status: DC
  Administered 2022-12-18: 750 mg via INTRAVENOUS
  Filled 2022-12-17: qty 150

## 2022-12-17 MED ORDER — WARFARIN SODIUM 2.5 MG PO TABS
2.5000 mg | ORAL_TABLET | Freq: Every day | ORAL | Status: DC
  Administered 2022-12-17: 2.5 mg via ORAL
  Filled 2022-12-17: qty 1

## 2022-12-17 MED ORDER — LACTATED RINGERS IV SOLN
150.0000 mL/h | INTRAVENOUS | Status: DC
  Administered 2022-12-17: 150 mL/h via INTRAVENOUS

## 2022-12-17 MED ORDER — WARFARIN - PHARMACIST DOSING INPATIENT
Freq: Every day | Status: DC
  Filled 2022-12-17: qty 1

## 2022-12-17 MED ORDER — SODIUM CHLORIDE 0.9 % IV SOLN
2.0000 g | INTRAVENOUS | Status: DC
  Administered 2022-12-18 – 2022-12-20 (×3): 2 g via INTRAVENOUS
  Filled 2022-12-17 (×4): qty 20

## 2022-12-17 MED ORDER — SODIUM CHLORIDE 0.9 % IV SOLN
500.0000 mg | INTRAVENOUS | Status: DC
  Administered 2022-12-18 – 2022-12-20 (×3): 500 mg via INTRAVENOUS
  Filled 2022-12-17 (×4): qty 5

## 2022-12-17 MED ORDER — HYDRALAZINE HCL 50 MG PO TABS
50.0000 mg | ORAL_TABLET | Freq: Two times a day (BID) | ORAL | Status: DC
  Administered 2022-12-17 – 2022-12-20 (×6): 50 mg via ORAL
  Filled 2022-12-17 (×6): qty 1

## 2022-12-17 MED ORDER — MAGNESIUM HYDROXIDE 400 MG/5ML PO SUSP
30.0000 mL | Freq: Every day | ORAL | Status: DC | PRN
  Administered 2022-12-18: 30 mL via ORAL
  Filled 2022-12-17: qty 30

## 2022-12-17 MED ORDER — FLEET ENEMA RE ENEM: 1.0000 | ENEMA | Freq: Every day | RECTAL | Status: DC | PRN

## 2022-12-17 MED ORDER — DOCUSATE SODIUM 100 MG PO CAPS
100.0000 mg | ORAL_CAPSULE | Freq: Two times a day (BID) | ORAL | Status: DC | PRN
  Administered 2022-12-17 – 2022-12-18 (×2): 100 mg via ORAL
  Filled 2022-12-17 (×3): qty 1

## 2022-12-17 MED ORDER — KETOROLAC TROMETHAMINE 15 MG/ML IJ SOLN
15.0000 mg | Freq: Four times a day (QID) | INTRAMUSCULAR | Status: DC | PRN
  Administered 2022-12-17 – 2022-12-19 (×4): 15 mg via INTRAVENOUS
  Filled 2022-12-17 (×4): qty 1

## 2022-12-17 MED ORDER — ZOLPIDEM TARTRATE 5 MG PO TABS
10.0000 mg | ORAL_TABLET | Freq: Every day | ORAL | Status: DC
  Administered 2022-12-17 – 2022-12-19 (×4): 10 mg via ORAL
  Filled 2022-12-17 (×4): qty 2

## 2022-12-17 MED ORDER — HYDRALAZINE HCL 20 MG/ML IJ SOLN
10.0000 mg | Freq: Four times a day (QID) | INTRAMUSCULAR | Status: DC | PRN
  Administered 2022-12-17 (×2): 10 mg via INTRAVENOUS
  Filled 2022-12-17 (×2): qty 1

## 2022-12-17 MED ORDER — OMEPRAZOLE MAGNESIUM 20 MG PO TBEC: 20.0000 mg | DELAYED_RELEASE_TABLET | ORAL | Status: DC

## 2022-12-17 MED ORDER — METOPROLOL TARTRATE 25 MG PO TABS: 25.0000 mg | ORAL_TABLET | Freq: Two times a day (BID) | ORAL | Status: DC

## 2022-12-17 MED ORDER — SODIUM CHLORIDE 0.9 % IV SOLN
500.0000 mg | INTRAVENOUS | Status: DC
  Administered 2022-12-17: 500 mg via INTRAVENOUS
  Filled 2022-12-17: qty 5

## 2022-12-17 MED ORDER — WARFARIN SODIUM 2 MG PO TABS
2.0000 mg | ORAL_TABLET | Freq: Every day | ORAL | Status: DC
  Administered 2022-12-17: 2 mg via ORAL
  Filled 2022-12-17: qty 1

## 2022-12-17 MED ORDER — FEBUXOSTAT 40 MG PO TABS
40.0000 mg | ORAL_TABLET | Freq: Every evening | ORAL | Status: DC
  Filled 2022-12-17: qty 1

## 2022-12-17 MED ORDER — SERTRALINE HCL 50 MG PO TABS
150.0000 mg | ORAL_TABLET | ORAL | Status: DC
  Administered 2022-12-17 – 2022-12-20 (×4): 150 mg via ORAL
  Filled 2022-12-17 (×4): qty 3

## 2022-12-17 MED ORDER — FEBUXOSTAT 40 MG PO TABS
40.0000 mg | ORAL_TABLET | Freq: Every evening | ORAL | Status: DC
  Administered 2022-12-17 – 2022-12-19 (×3): 40 mg via ORAL
  Filled 2022-12-17 (×4): qty 1

## 2022-12-17 MED ORDER — IPRATROPIUM BROMIDE 0.03 % NA SOLN
2.0000 | NASAL | Status: DC
  Administered 2022-12-18 – 2022-12-20 (×2): 2 via NASAL
  Filled 2022-12-17 (×2): qty 30

## 2022-12-17 MED ORDER — MORPHINE SULFATE (PF) 2 MG/ML IV SOLN
2.0000 mg | INTRAVENOUS | Status: DC | PRN
  Administered 2022-12-19: 2 mg via INTRAVENOUS
  Filled 2022-12-17 (×2): qty 1

## 2022-12-17 MED ORDER — ONDANSETRON HCL 4 MG/2ML IJ SOLN: 4.0000 mg | Freq: Four times a day (QID) | INTRAMUSCULAR | Status: DC | PRN

## 2022-12-17 MED ORDER — FLUTICASONE PROPIONATE 50 MCG/ACT NA SUSP
1.0000 | Freq: Every day | NASAL | Status: DC
  Administered 2022-12-17: 1 via NASAL
  Filled 2022-12-17: qty 16

## 2022-12-17 MED ORDER — CETIRIZINE HCL 10 MG PO TABS
10.0000 mg | ORAL_TABLET | Freq: Every evening | ORAL | Status: DC
  Filled 2022-12-17: qty 1

## 2022-12-17 MED ORDER — BUPROPION HCL ER (XL) 150 MG PO TB24
150.0000 mg | ORAL_TABLET | Freq: Every day | ORAL | Status: DC
  Administered 2022-12-17 – 2022-12-20 (×4): 150 mg via ORAL
  Filled 2022-12-17 (×5): qty 1

## 2022-12-17 MED ORDER — CETIRIZINE HCL 10 MG PO TABS
10.0000 mg | ORAL_TABLET | Freq: Every evening | ORAL | Status: DC
  Administered 2022-12-17 – 2022-12-19 (×3): 10 mg via ORAL
  Filled 2022-12-17 (×4): qty 1

## 2022-12-17 MED ORDER — INFLUENZA VAC A&B SURF ANT ADJ 0.5 ML IM SUSY
0.5000 mL | PREFILLED_SYRINGE | INTRAMUSCULAR | Status: AC
  Administered 2022-12-20: 0.5 mL via INTRAMUSCULAR
  Filled 2022-12-17: qty 0.5

## 2022-12-17 MED ORDER — METOPROLOL TARTRATE 5 MG/5ML IV SOLN
5.0000 mg | Freq: Four times a day (QID) | INTRAVENOUS | Status: DC | PRN
  Administered 2022-12-18: 5 mg via INTRAVENOUS
  Filled 2022-12-17: qty 5

## 2022-12-17 MED ORDER — FENOFIBRATE 145 MG PO TABS
145.0000 mg | ORAL_TABLET | Freq: Every evening | ORAL | Status: DC
  Filled 2022-12-17 (×3): qty 1

## 2022-12-17 MED ORDER — ONDANSETRON HCL 4 MG PO TABS: 4.0000 mg | ORAL_TABLET | Freq: Four times a day (QID) | ORAL | Status: DC | PRN

## 2022-12-17 MED ORDER — LEVOCETIRIZINE DIHYDROCHLORIDE 5 MG PO TABS: 5.0000 mg | ORAL_TABLET | Freq: Every evening | ORAL | Status: DC

## 2022-12-17 MED ORDER — PANTOPRAZOLE SODIUM 40 MG PO TBEC
40.0000 mg | DELAYED_RELEASE_TABLET | Freq: Every day | ORAL | Status: DC
  Administered 2022-12-17 – 2022-12-20 (×4): 40 mg via ORAL
  Filled 2022-12-17 (×4): qty 1

## 2022-12-17 MED ORDER — FENOFIBRATE 160 MG PO TABS
160.0000 mg | ORAL_TABLET | Freq: Every evening | ORAL | Status: DC
  Administered 2022-12-17 – 2022-12-19 (×3): 160 mg via ORAL
  Filled 2022-12-17 (×4): qty 1

## 2022-12-17 MED ORDER — VANCOMYCIN HCL 2000 MG/400ML IV SOLN
2000.0000 mg | Freq: Once | INTRAVENOUS | Status: AC
  Administered 2022-12-17: 2000 mg via INTRAVENOUS
  Filled 2022-12-17: qty 400

## 2022-12-17 NOTE — Progress Notes (Signed)
1/4 GPC: [facility] - PHYSICIAN COMMUNICATION CRITICAL VALUE ALERT - BLOOD CULTURE IDENTIFICATION (BCID)  Craig Gilmore is an 88 y.o. male who [facility] to [facility] on [facility]/16/2022 with a chief complaint of abdominal pain  Assessment:  1/4 (anaerobic) GPC: MRSE, suspected contaminant   Name of physician (or Provider) Contacted: Dr Arville Care  Current antibiotics: ceftriaxone and azithromycin   Changes to prescribed antibiotics recommended: suspected contaminant, no changes to current Abx recommended Recommendations declined by provider   Results for orders placed or performed during the hospital encounter of 12/16/22  Blood Culture ID Panel (Reflexed) (Collected: 12/16/2022 11:53 PM)  Result Value Ref Range   Enterococcus faecalis NOT DETECTED NOT DETECTED   Enterococcus Faecium NOT DETECTED NOT DETECTED   Listeria monocytogenes NOT DETECTED NOT DETECTED   Staphylococcus species DETECTED (A) NOT DETECTED   Staphylococcus aureus (BCID) NOT DETECTED NOT DETECTED   Staphylococcus epidermidis DETECTED (A) NOT DETECTED   Staphylococcus lugdunensis NOT DETECTED NOT DETECTED   Streptococcus species NOT DETECTED NOT DETECTED   Streptococcus agalactiae NOT DETECTED NOT DETECTED   Streptococcus pneumoniae NOT DETECTED NOT DETECTED   Streptococcus pyogenes NOT DETECTED NOT DETECTED   A.calcoaceticus-baumannii NOT DETECTED NOT DETECTED   Bacteroides fragilis NOT DETECTED NOT DETECTED   Enterobacterales NOT DETECTED NOT DETECTED   Enterobacter cloacae complex NOT DETECTED NOT DETECTED   Escherichia coli NOT DETECTED NOT DETECTED   Klebsiella aerogenes NOT DETECTED NOT DETECTED   Klebsiella oxytoca NOT DETECTED NOT DETECTED   Klebsiella pneumoniae NOT DETECTED NOT DETECTED   Proteus species NOT DETECTED NOT DETECTED   Salmonella species NOT DETECTED NOT DETECTED   Serratia marcescens NOT DETECTED NOT DETECTED   Haemophilus influenzae NOT DETECTED NOT DETECTED   Neisseria meningitidis NOT DETECTED  NOT DETECTED   Pseudomonas aeruginosa NOT DETECTED NOT DETECTED   Stenotrophomonas maltophilia NOT DETECTED NOT DETECTED   Candida albicans NOT DETECTED NOT DETECTED   Candida auris NOT DETECTED NOT DETECTED   Candida glabrata NOT DETECTED NOT DETECTED   Candida krusei NOT DETECTED NOT DETECTED   Candida parapsilosis NOT DETECTED NOT DETECTED   Candida tropicalis NOT DETECTED NOT DETECTED   Cryptococcus neoformans/gattii NOT DETECTED NOT DETECTED   Methicillin resistance mecA/C DETECTED (A) NOT DETECTED    Sharen Hones, PharmD, BCPS Clinical Pharmacist   12/17/2022  7:51 PM

## 2022-12-17 NOTE — Progress Notes (Signed)
Transition of Care [facility]) - Inpatient Brief Assessment   Patient Details  Name: CICEL PASIERB MRN: [id number] Date of Birth: Jan 13, 1935  Transition of Care [facility]) CM/SW Contact:    Truddie Hidden, RN Phone Number: 12/17/2022, 4:00 PM   Clinical Narrative: TOC continuing to follow patient's progress throughout discharge planning.   Transition of Care Asessment: Insurance and Status: Insurance coverage has been reviewed Patient has primary care physician: Yes Home environment has been reviewed: home Prior level of function:: independent Prior/Current Home Services: No current home services Social Determinants of Health Reivew: SDOH reviewed no interventions necessary Readmission risk has been reviewed: Yes Transition of care needs: no transition of care needs at this time

## 2022-12-17 NOTE — Progress Notes (Signed)
ANTICOAGULATION CONSULT NOTE - Initial Consult  Pharmacy Consult for Warfarin  Indication: Atrial fibrillation  Allergies  Allergen Reactions   Baycol [Cerivastatin]    Pravastatin     Patient Measurements: Height: 5\' 7"  (170.2 cm) Weight: 103.2 kg (227 lb 8.2 oz) IBW/kg (Calculated) : 66.1 Heparin Dosing Weight:   Vital Signs: Temp: 97.9 F (36.6 C) (09/09 1904) Temp Source: Oral (09/09 1904) BP: 145/63 (09/09 2330) Pulse Rate: 87 (09/09 2330)  Labs: Recent Labs    12/16/22 1907 12/16/22 2100 12/16/22 2102  HGB 13.7  --   --   HCT 42.5  --   --   PLT 255  --   --   LABPROT  --   --  27.7*  INR  --   --  2.6*  CREATININE 1.87*  --   --   TROPONINIHS 34* 43*  --     Estimated Creatinine Clearance: 32.4 mL/min (A) (by C-G formula based on SCr of 1.87 mg/dL (H)).   Medical History: Past Medical History:  Diagnosis Date   Chronic atrial fibrillation (HCC)    a.) CHA2DS2VASc = 3 (age x 2, HTN);  b.) s/p DCCV 08/20/2004 --> 50 J x 1 and 100 J x 1 --> coverted with SB. (+) post-procedure nausea; c.) rate/rhythm maintained without pharmacological intervention; chronically anticoagulated with warfarin   Colonic polyp    COPD (chronic obstructive pulmonary disease) (HCC)    Depression    Dyspnea    Edema    GERD (gastroesophageal reflux disease)    Gout    Hemorrhoids    Hyperlipidemia    Hypertension    Insomnia    a.) on hypnotic (zolpidem)   Long term current use of anticoagulant    a.) warfarin   OSA on CPAP    Pneumonia    Pulmonary HTN (HCC) 02/14/2016   a.) TTE 02/14/2016: RVSP 37.5 mmHg   Sick sinus syndrome (HCC)    Squamous cell carcinoma in situ 10/03/2009   L medial infrapectoral   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   L nose supratip   Stage 3b chronic kidney disease (CKD) (HCC)     Medications:  (Not in a hospital admission)   Assessment: Pharmacy consulted to dose warfarin for Afib in this 88 year old male admitted with sepsis  due to PNA.   Pt was on warfarin 2.5 mg PO daily.   Last dose on 9/8 PM.  9/9:  INR @ 2102 = 2.6   Goal of Therapy:  INR 2-3   Plan:  Will order warfarin 2.5 mg PO daily to start 9/10 @ ~ 0100 since pt did not have evening dose on 9/9.  Will check INR daily.   Aby Gessel D 12/17/2022,1:00 AM

## 2022-12-17 NOTE — Plan of Care (Signed)
  Problem: Clinical Measurements: Goal: Respiratory complications will improve Outcome: Progressing   Problem: Clinical Measurements: Goal: Cardiovascular complication will be avoided Outcome: Progressing   Problem: Activity: Goal: Risk for activity intolerance will decrease Outcome: Progressing   Problem: Nutrition: Goal: Adequate nutrition will be maintained Outcome: Progressing   Problem: Elimination: Goal: Will not experience complications related to urinary retention Outcome: Progressing   Problem: Pain Managment: Goal: General experience of comfort will improve Outcome: Progressing

## 2022-12-17 NOTE — Consult Note (Signed)
Pharmacy Antibiotic Note  Craig Gilmore is a 88 y.o. male admitted on 12/16/2022 with abdominal pain. Started on Abx for CAP. 9/10 positive blood cultures 1/4 (anaerobic) GPC: MRSE. Pharmacy has been consulted for Vancomycin dosing for bacteremia   Plan: Vancomycin 2000 mg x 1 LD followed by Initiate Vancomycin 750 mg Q24H. Goal AUC 400-600 Estimated AUC 492/Cmin: 14.5 Scr 1.64, IBW, Vd 0.5 (BMI 35.5)   Height: 5\' 7"  (170.2 cm) Weight: 103.2 kg (227 lb 8.2 oz) IBW/kg (Calculated) : 66.1  Temp (24hrs), Avg:97.6 F (36.4 C), Min:96.4 F (35.8 C), Max:98 F (36.7 C)  Recent Labs  Lab 12/16/22 1907 12/16/22 2353 12/17/22 0148 12/17/22 0506  WBC 11.3*  --   --  9.6  CREATININE 1.87*  --   --  1.64*  LATICACIDVEN  --  0.8 1.0  --     Estimated Creatinine Clearance: 37 mL/min (A) (by C-G formula based on SCr of 1.64 mg/dL (H)).    Allergies  Allergen Reactions   Baycol [Cerivastatin]    Pravastatin     Antimicrobials this admission: 9/10 ceftriaxone >>  9/10 vancomycin >>  9/10 azithromycin >>  Dose adjustments this admission:   Microbiology results: 9/9 BCx: 1/4 (anaerobic) GPC: MRSE 9/9 MRSA PCR: neg  Thank you for allowing pharmacy to be a part of this patient's care.  Sharen Hones, PharmD, BCPS Clinical Pharmacist   12/17/2022 8:21 PM

## 2022-12-17 NOTE — Progress Notes (Signed)
Progress Note   Patient: Craig Gilmore:[id number] DOB: Dec 12, 1934 DOA: 12/16/2022     1 DOS: the patient was seen and examined on 12/17/2022   Brief hospital course: DANISH JAHNKE is a 88 y.o. male with medical history significant of chronic atrial fibrillation on warfarin, COPD, depression, GERD, sick sinus syndrome, gout, who presented the ER today with abdominal pain.  In ED he was noted to have high blood pressure. CTA chest/ abdomen/ pelvis showed Ascending thoracic aortic aneurysm measuring 4.3 x 3.7 cm, pathologic mediastinal and right hilar lymphadenopathy of uncertain etiology, interstitial lung disease, ground-glass opacity in the right lower lobe.  Patient is admitted to the hospitalist service with impression of sepsis secondary to pneumonia, hypertensive urgency.  Assessment and Plan: Right lower lobe pneumonia- Unlikely sepsis given normal white count, lactic acid and vitals, saturation. Patient does have groundglass opacity in the right lower lobe. Continue Rocephin and azithromycin therapy. Continue DuoNebs, bronchodilators as needed. Given bilateral interstitial lung disease, possible edema will get echocardiogram rule out CHF. Continue to monitor closely.  Epigastric abdominal discomfort- Possibly from right lower lobe pneumonia. He does have ascending thoracic aortic aneurysm which will need to be monitored with yearly imaging studies. Continue pain control. Constipation regimen and PPI ordered.  Hypertensive urgency- IV hydralazine as needed ordered for SBP greater than 160. Oral hydralazine 50 mg twice daily ordered. Avoid beta-blockers given bradycardia history.  Chronic atrial fibrillation on Coumadin therapy- Patient's heart rate stable. Continue Coumadin therapy as per pharmacy protocol.  Hyperlipidemia- Continue statin therapy  Chronic kidney disease stage IIIb- Creatinine send stable. Avoid nephrotoxic drugs. Continue to monitor daily renal  function.  Sick sinus syndrome- Patient does have bradycardia noted on the telemetry with lowest heart rate 36. Will avoid beta-blocker therapy. Continue telemetry monitoring.  Obesity with BMI 35.63 Diet, exercise and weight reduction counseling.      Subjective: Patient is seen and examined today morning, states that he is unable to lie flat.  Does have abdominal discomfort.  No nausea or vomiting.  Able to tolerate diet.  Physical Exam: Vitals:   12/17/22 1040 12/17/22 1222 12/17/22 1539 12/17/22 1717  BP: (!) 150/85 (!) 163/92 (!) 190/95 (!) 175/98  Pulse: 76 81 64 76  Resp:  18  18  Temp:  97.6 F (36.4 C)  98 F (36.7 C)  TempSrc:    Oral  SpO2:  95%  99%  Weight:      Height:       General - Elderly Caucasian male, no apparent distress HEENT - PERRLA, EOMI, atraumatic head, non tender sinuses. Lung - Clear, bibasal rales, rhonchi noted. Heart - S1, S2 heard, no murmurs, rubs, 1+ pedal edema. Abdomen - distended, soft, bowel sounds good. Neuro - Alert, awake and oriented, non focal exam. Skin - Warm and dry. Data Reviewed:     Latest Ref Rng & Units 12/17/2022    5:06 AM 12/16/2022    7:07 PM  CBC  WBC 4.0 - 10.5 K/uL 9.6  11.3   Hemoglobin 13.0 - 17.0 g/dL 81.1  91.4   Hematocrit 39.0 - 52.0 % 39.2  42.5   Platelets 150 - 400 K/uL 222  255        Latest Ref Rng & Units 12/17/2022    5:06 AM 12/16/2022    7:07 PM  BMP  Glucose 70 - 99 mg/dL 97  782   BUN 8 - 23 mg/dL 36  38   Creatinine 9.56 - 1.24  mg/dL 1.61  0.96   Sodium 045 - 145 mmol/L 135  135   Potassium 3.5 - 5.1 mmol/L 3.5  4.1   Chloride 98 - 111 mmol/L 103  101   CO2 22 - 32 mmol/L 22  22   Calcium 8.9 - 10.3 mg/dL 8.6  9.2     CT Angio Chest/Abd/Pel for Dissection W and/or Wo Contrast  Result Date: 12/16/2022 CLINICAL DATA:  Acute aortic syndrome suspected. EXAM: CT ANGIOGRAPHY CHEST, ABDOMEN AND PELVIS TECHNIQUE: Non-contrast CT of the chest was initially obtained. Multidetector CT imaging  through the chest, abdomen and pelvis was performed using the standard protocol during bolus administration of intravenous contrast. Multiplanar reconstructed images and MIPs were obtained and reviewed to evaluate the vascular anatomy. RADIATION DOSE REDUCTION: This exam was performed according to the departmental dose-optimization program which includes automated exposure control, adjustment of the mA and/or kV according to patient size and/or use of iterative reconstruction technique. CONTRAST:  75mL OMNIPAQUE IOHEXOL 350 MG/ML SOLN COMPARISON:  CT abdomen and pelvis 12/09/2006 FINDINGS: CTA CHEST FINDINGS Cardiovascular: There is adequate opacification of the thoracic aorta. The ascending aorta is dilated measuring 4.3 x 3.7 cm. There is no evidence for aortic dissection. Descending thoracic aorta is mildly dilated measuring up to 3.1 cm. There is mild calcified atherosclerotic disease throughout the aorta. The heart is mildly enlarged. There are atherosclerotic calcifications of the coronary arteries. There is no pericardial effusion. Mediastinum/Nodes: There are no large and enlarged paratracheal lymph nodes. The largest measures 2.8 by 2.8 cm. There is an enlarged subcarinal lymph node measuring 1.6 cm short axis. Enlarged posterior mediastinal lymph nodes adjacent to the esophagus measure 2.0 x 2.4 by 4.6 cm. Enlarged right hilar lymph nodes measure up to 1.8 cm short axis. There are numerous nonenlarged prevascular lymph nodes. Visualized esophagus and thyroid gland are within normal limits. Lungs/Pleura: Peripheral reticular opacities are seen throughout both lungs with some associated honeycombing and ground-glass opacities also seen peripherally. Focal ground-glass opacity seen in the right lower lobe measuring 3.3 by 2.0 by 3.3 cm. There is no pleural effusion or pneumothorax. Musculoskeletal: No chest wall abnormality. No acute or significant osseous findings. Review of the MIP images confirms the above  findings. CTA ABDOMEN AND PELVIS FINDINGS VASCULAR Aorta: Normal caliber aorta without aneurysm, dissection, vasculitis or significant stenosis. There is calcified atherosclerotic disease throughout the aorta. Celiac: Patent without evidence of aneurysm, dissection, vasculitis or significant stenosis. SMA: Patent without evidence of aneurysm, dissection, vasculitis or significant stenosis. Renals: Both renal arteries are patent without evidence of aneurysm, dissection, vasculitis, fibromuscular dysplasia or significant stenosis. IMA: Patent without evidence of aneurysm, dissection, vasculitis or significant stenosis. Inflow: Patent without evidence of aneurysm, dissection, vasculitis or significant stenosis. Veins: No obvious venous abnormality within the limitations of this arterial phase study. Review of the MIP images confirms the above findings. NON-VASCULAR Hepatobiliary: No focal liver abnormality is seen. Status post cholecystectomy. No biliary dilatation. Pancreas: Unremarkable. No pancreatic ductal dilatation or surrounding inflammatory changes. Spleen: Normal in size without focal abnormality. Adrenals/Urinary Tract: Posterior left bladder diverticulum is present. The bladder is distended, but otherwise within normal limits. There is a rounded low-attenuation area in the inferior pole the right kidney measuring 4.8 cm in diameter this is decreased in size. This measures 31 Hounsfield units. Stomach/Bowel: Stomach is within normal limits. Appendix appears normal. No evidence of bowel wall thickening, distention, or inflammatory changes. There is sigmoid colon diverticulosis. Lymphatic: No enlarged lymph nodes. Reproductive: Prostate gland is  enlarged. Other: There is a small fat containing left inguinal hernia. There is no free fluid. Musculoskeletal: Degenerative changes affect the spine. Review of the MIP images confirms the above findings. IMPRESSION: 1. No evidence for aortic dissection or aneurysm. 2.  Ascending thoracic aortic aneurysm measuring 4.3 x 3.7 cm. Recommend annual imaging followup by CTA or MRA. This recommendation follows 2010 ACCF/AHA/AATS/ACR/ASA/SCA/SCAI/SIR/STS/SVM Guidelines for the Diagnosis and Management of Patients with Thoracic Aortic Disease. Circulation. 2010; 121: W098-J191. Aortic aneurysm NOS (ICD10-I71.9) 3. Pathologic mediastinal and right hilar lymphadenopathy of uncertain etiology. 4. Findings compatible with interstitial lung disease. 5. Ground-glass opacity in the right lower lobe measuring 3.3 cm. This may be infectious/inflammatory, but neoplasm can not be excluded. 6. 4.8 cm indeterminate right renal lesion, possibly complex cysts. Recommend further evaluation with dedicated CT or MRI. 7. Bladder diverticulum. 8. Colonic diverticulosis. 9. Prostatomegaly. 10. Small fat containing left inguinal hernia. Electronically Signed   By: Darliss Cheney M.D.   On: 12/16/2022 23:30   DG Chest 2 View  Result Date: 12/16/2022 CLINICAL DATA:  Chest pain EXAM: CHEST - 2 VIEW COMPARISON:  08/25/2012 FINDINGS: Asymmetric bilateral interstitial pulmonary infiltrates are present, likely infectious or inflammatory in the acute setting. No pneumothorax or pleural effusion. Cardiac size is at the upper limits of normal. Right paratracheal soft tissue opacity likely represents vascular shadow. No acute bone abnormality. IMPRESSION: 1. Asymmetric bilateral interstitial pulmonary infiltrates, likely infectious or inflammatory in the acute setting. Electronically Signed   By: Helyn Numbers M.D.   On: 12/16/2022 21:31     Family Communication: Wife at bedside, updated regarding his care.  Disposition: Status is: Inpatient Remains inpatient appropriate because: Pneumonia, CHF, uncontrolled BP management.  Planned Discharge Destination: Home with Home Health    Time spent: 43 minutes  Author: Marcelino Duster, MD 12/17/2022 5:28 PM  For on call review [email or web address].

## 2022-12-17 NOTE — Progress Notes (Signed)
Pt refused bed alarm at this time. Wife and pt was educated about its importance. Will; continue to monitor.

## 2022-12-17 NOTE — Progress Notes (Addendum)
Pt and wife is requesting something to help with bowels. Per pt report did have a small bowel movement today. MD Mansy made aware.Will continue to monitor.  Update 2037: See new orders. Will continue to monitor.  Update 2050: See new orders. Will continue to monitor.

## 2022-12-17 NOTE — Progress Notes (Signed)
ANTICOAGULATION CONSULT NOTE -   Pharmacy Consult for Warfarin  Indication: Atrial fibrillation  Allergies  Allergen Reactions   Baycol [Cerivastatin]    Pravastatin     Patient Measurements: Height: 5\' 7"  (170.2 cm) Weight: 103.2 kg (227 lb 8.2 oz) IBW/kg (Calculated) : 66.1 Heparin Dosing Weight:   Vital Signs: Temp: 96.4 F (35.8 C) (09/10 0517) Temp Source: Axillary (09/10 0517) BP: 140/102 (09/10 0600) Pulse Rate: 59 (09/10 0600)  Labs: Recent Labs    12/16/22 1907 12/16/22 2100 12/16/22 2102 12/17/22 0506  HGB 13.7  --   --  12.5*  HCT 42.5  --   --  39.2  PLT 255  --   --  222  LABPROT  --   --  27.7* 29.5*  INR  --   --  2.6* 2.8*  CREATININE 1.87*  --   --  1.64*  TROPONINIHS 34* 43*  --   --     Estimated Creatinine Clearance: 37 mL/min (A) (by C-G formula based on SCr of 1.64 mg/dL (H)).   Medical History: Past Medical History:  Diagnosis Date   Chronic atrial fibrillation (HCC)    a.) CHA2DS2VASc = 3 (age x 2, HTN);  b.) s/p DCCV 08/20/2004 --> 50 J x 1 and 100 J x 1 --> coverted with SB. (+) post-procedure nausea; c.) rate/rhythm maintained without pharmacological intervention; chronically anticoagulated with warfarin   Colonic polyp    COPD (chronic obstructive pulmonary disease) (HCC)    Depression    Dyspnea    Edema    GERD (gastroesophageal reflux disease)    Gout    Hemorrhoids    Hyperlipidemia    Hypertension    Insomnia    a.) on hypnotic (zolpidem)   Long term current use of anticoagulant    a.) warfarin   OSA on CPAP    Pneumonia    Pulmonary HTN (HCC) 02/14/2016   a.) TTE 02/14/2016: RVSP 37.5 mmHg   Sick sinus syndrome (HCC)    Squamous cell carcinoma in situ 10/03/2009   L medial infrapectoral   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   Pt was on warfarin 2.5 mg PO daily.   Last dose on 9/8 PM.  9/9   INR @ 2102 = 2.6   warfarin 2.5 mg (@0111 ) 9/10 INR 2.8  Goal of Therapy:  INR 2-3   Plan:  Will order warfarin 2 mg po x 1 today DDI: azithromycin, fenofibrate  Will check INR daily, cbc,plt per protocol  Hula Tasso A 12/17/2022,7:49 AM

## 2022-12-18 ENCOUNTER — Inpatient Hospital Stay
Admit: 2022-12-18 | Discharge: 2022-12-18 | Disposition: A | Discharge status: Home / Self Care | Payer: No Typology Code available for payment source | Attending: Internal Medicine

## 2022-12-18 DIAGNOSIS — R0600 Dyspnea, unspecified: Secondary | ICD-10-CM | POA: Diagnosis not present

## 2022-12-18 DIAGNOSIS — I7121 Aneurysm of the ascending aorta, without rupture: Secondary | ICD-10-CM | POA: Diagnosis not present

## 2022-12-18 DIAGNOSIS — N1832 Chronic kidney disease, stage 3b: Secondary | ICD-10-CM | POA: Diagnosis not present

## 2022-12-18 DIAGNOSIS — R7989 Other specified abnormal findings of blood chemistry: Secondary | ICD-10-CM | POA: Diagnosis not present

## 2022-12-18 DIAGNOSIS — J849 Interstitial pulmonary disease, unspecified: Secondary | ICD-10-CM | POA: Diagnosis not present

## 2022-12-18 DIAGNOSIS — I482 Chronic atrial fibrillation, unspecified: Secondary | ICD-10-CM | POA: Diagnosis not present

## 2022-12-18 DIAGNOSIS — I495 Sick sinus syndrome: Secondary | ICD-10-CM | POA: Diagnosis not present

## 2022-12-18 DIAGNOSIS — I160 Hypertensive urgency: Secondary | ICD-10-CM | POA: Diagnosis not present

## 2022-12-18 LAB — BASIC METABOLIC PANEL
Anion gap: 10 (ref 5–15)
BUN: 33 mg/dL — ABNORMAL HIGH (ref 8–23)
CO2: 19 mmol/L — ABNORMAL LOW (ref 22–32)
Calcium: 8.5 mg/dL — ABNORMAL LOW (ref 8.9–10.3)
Chloride: 105 mmol/L (ref 98–111)
Creatinine, Ser: 1.61 mg/dL — ABNORMAL HIGH (ref 0.61–1.24)
GFR, Estimated: 41 mL/min — ABNORMAL LOW (ref >60)
Glucose, Bld: 98 mg/dL (ref 70–99)
Potassium: 3.5 mmol/L (ref 3.5–5.1)
Sodium: 134 mmol/L — ABNORMAL LOW (ref 135–145)

## 2022-12-18 LAB — PROTIME-INR
INR: 3.4 — ABNORMAL HIGH (ref 0.8–1.2)
Prothrombin Time: 34.7 s — ABNORMAL HIGH (ref 11.4–15.2)

## 2022-12-18 LAB — ECHOCARDIOGRAM COMPLETE
AR max vel: 2.19 cm2
AV Area VTI: 2.31 cm2
AV Area mean vel: 2.18 cm2
AV Mean grad: 3 mmHg
AV Peak grad: 4.8 mmHg
Ao pk vel: 1.1 m/s
Area-P 1/2: 3.64 cm2
Height: 67 in
S' Lateral: 2.4 cm
Weight: 3640.24 [oz_av]

## 2022-12-18 LAB — CBC
HCT: 39.9 % (ref 39.0–52.0)
Hemoglobin: 13 g/dL (ref 13.0–17.0)
MCH: 28.9 pg (ref 26.0–34.0)
MCHC: 32.6 g/dL (ref 30.0–36.0)
MCV: 88.7 fL (ref 80.0–100.0)
Platelets: 231 10*3/uL (ref 150–400)
RBC: 4.5 MIL/uL (ref 4.22–5.81)
RDW: 15.4 % (ref 11.5–15.5)
WBC: 9.1 10*3/uL (ref 4.0–10.5)
nRBC: 0 % (ref 0.0–0.2)

## 2022-12-18 LAB — BRAIN NATRIURETIC PEPTIDE: B Natriuretic Peptide: 250.6 pg/mL — ABNORMAL HIGH (ref 0.0–100.0)

## 2022-12-18 MED ORDER — FUROSEMIDE 10 MG/ML IJ SOLN: 40.0000 mg | Freq: Once | INTRAMUSCULAR | Status: DC

## 2022-12-18 MED ORDER — FUROSEMIDE 10 MG/ML IJ SOLN
20.0000 mg | Freq: Once | INTRAMUSCULAR | Status: AC
  Administered 2022-12-18: 20 mg via INTRAVENOUS
  Filled 2022-12-18: qty 2

## 2022-12-18 MED ORDER — LOSARTAN POTASSIUM 50 MG PO TABS
50.0000 mg | ORAL_TABLET | Freq: Every day | ORAL | Status: DC
  Administered 2022-12-18 – 2022-12-20 (×3): 50 mg via ORAL
  Filled 2022-12-18 (×3): qty 1

## 2022-12-18 NOTE — Progress Notes (Signed)
*  PRELIMINARY RESULTS* Echocardiogram 2D Echocardiogram has been performed.  Cristela Blue 12/18/2022, 10:09 AM

## 2022-12-18 NOTE — Progress Notes (Signed)
Progress Note   Patient: Craig Gilmore ZOX:[id number] DOB: [birth year] DOA: 12/16/2022     2 DOS: the patient was seen and examined on 12/18/2022   Brief hospital course: NYZIAH Craig Gilmore is a 88 y.o. male with medical history significant of chronic atrial fibrillation on warfarin, COPD, depression, GERD, sick sinus syndrome, gout, who presented the ER today with abdominal pain.  In ED he was noted to have high blood pressure. CTA chest/ abdomen/ pelvis showed Ascending thoracic aortic aneurysm measuring 4.3 x 3.7 cm, pathologic mediastinal and right hilar lymphadenopathy of uncertain etiology, interstitial lung disease, ground-glass opacity in the right lower lobe.  Patient is admitted to the hospitalist service with impression of sepsis secondary to pneumonia, hypertensive urgency.  Assessment and Plan: Right lower lobe pneumonia- Unlikely sepsis given normal white count, lactic acid and vitals, saturation. Patient does have groundglass opacity in the right lower lobe. Continue Rocephin and azithromycin therapy. Continue DuoNebs, bronchodilators as needed. Continue to monitor closely.  Epigastric abdominal discomfort- Possibly from right lower lobe pneumonia vs constipation. He does have ascending thoracic aortic aneurysm which will need to be monitored with yearly imaging studies. Continue pain control. Constipation regimen and PPI ordered.  Hypertensive urgency- IV hydralazine as needed ordered for SBP greater than 160. Oral hydralazine 50 mg twice daily. Losartan 50 mg daily ordered. Avoid beta-blockers given bradycardia history.  Dyspnea with exertion: Given bilateral interstitial lung disease, pedal edema, orthopnea an echocardiogram ordered. Elevated BNP noted. He has been having worsening shortness of breath since Friday. Echo shows grade 3 diastolic dysfunction. Cardiology consult called.  Chronic atrial fibrillation on Coumadin therapy- Patient's heart rate stable. Continue  Coumadin therapy as per pharmacy protocol.  Hyperlipidemia- Continue statin therapy  Chronic kidney disease stage IIIb- Creatinine stable. Baseline creatinine from Care everywhere 1.8 GFR 36. Avoid nephrotoxic drugs. Continue to monitor daily renal function.  Sick sinus syndrome- Patient does have bradycardia noted on the telemetry with lowest heart rate 36. Will avoid beta-blocker therapy. Continue telemetry monitoring.  Obesity with BMI 35.63 Diet, exercise and weight reduction counseling.      Subjective: Patient is seen and examined today morning. Wife and son at bedside. He has poor appetite, abdominal pain better. Flushing of face noted with some chills. Remains afebrile. Had 2 bowel movements per wife. No nausea or vomiting.  Able to tolerate diet.  Physical Exam: Vitals:   12/18/22 0812 12/18/22 1233 12/18/22 1259 12/18/22 1532  BP: (!) 150/81 (!) 182/104 (!) 177/89 (!) 166/79  Pulse: 88 89 84 79  Resp: 16 16  18   Temp: (!) 97.5 F (36.4 C) 97.8 F (36.6 C)  97.6 F (36.4 C)  TempSrc: Oral Oral  Oral  SpO2: 98% 99%  99%  Weight:      Height:       General - Elderly Caucasian male, no apparent distress HEENT - PERRLA, EOMI, atraumatic head, non tender sinuses. Lung - Clear, bibasal rales, rhonchi noted. Heart - S1, S2 heard, no murmurs, rubs, 1+ pedal edema. Abdomen - distended, soft, bowel sounds good. Neuro - Alert, awake and oriented, non focal exam. Skin - Warm and dry. Data Reviewed:     Latest Ref Rng & Units 12/18/2022    3:41 AM 12/17/2022    5:06 AM 12/16/2022    7:07 PM  CBC  WBC 4.0 - 10.5 K/uL 9.1  9.6  11.3   Hemoglobin 13.0 - 17.0 g/dL 81.1  91.4  78.2   Hematocrit 39.0 - 52.0 %  39.9  39.2  42.5   Platelets 150 - 400 K/uL 231  222  255        Latest Ref Rng & Units 12/18/2022    3:41 AM 12/17/2022    5:06 AM 12/16/2022    7:07 PM  BMP  Glucose 70 - 99 mg/dL 98  97  660   BUN 8 - 23 mg/dL 33  36  38   Creatinine 0.61 - 1.24 mg/dL 6.30  1.60   1.09   Sodium 135 - 145 mmol/L 134  135  135   Potassium 3.5 - 5.1 mmol/L 3.5  3.5  4.1   Chloride 98 - 111 mmol/L 105  103  101   CO2 22 - 32 mmol/L 19  22  22    Calcium 8.9 - 10.3 mg/dL 8.5  8.6  9.2     ECHOCARDIOGRAM COMPLETE  Result Date: 12/18/2022    ECHOCARDIOGRAM REPORT   Patient Name:   Craig Gilmore Date of Exam: 12/18/2022 Medical Rec #:  [id number]     Height:       67.0 in Accession #:    [id number]    Weight:       227.5 lb Date of Birth:  Feb 15, 1935      BSA:          2.136 m Patient Age:    88 years      BP:           130/81 mmHg Patient Gender: M             HR:           88 bpm. Exam Location:  [facility] Procedure: 2D Echo, Cardiac Doppler and Color Doppler Indications:     Dyspnea R06.00  History:         Patient has no prior history of Echocardiogram examinations.                  Pulmonary HTN; Risk Factors:Hypertension.  Sonographer:     Cristela Blue Referring Phys:  [id number] Marcelino Duster Diagnosing Phys: Alwyn Pea MD IMPRESSIONS  1. Left ventricular ejection fraction, by estimation, is 65 to 70%. The left ventricle has normal function. The left ventricle has no regional wall motion abnormalities. There is moderate concentric left ventricular hypertrophy. Left ventricular diastolic parameters are consistent with Grade III diastolic dysfunction (restrictive).  2. Right ventricular systolic function is normal. The right ventricular size is normal. Mildly increased right ventricular wall thickness.  3. The mitral valve is normal in structure. Mild mitral valve regurgitation.  4. The aortic valve is calcified. There is moderate calcification of the aortic valve. Aortic valve regurgitation is not visualized. Aortic valve sclerosis/calcification is present, without any evidence of aortic stenosis. Conclusion(s)/Recommendation(s): Poor windows for evaluation of left ventricular function by transthoracic echocardiography. Would recommend an alternative means of evaluation. FINDINGS   Left Ventricle: Left ventricular ejection fraction, by estimation, is 65 to 70%. The left ventricle has normal function. The left ventricle has no regional wall motion abnormalities. The left ventricular internal cavity size was normal in size. There is  moderate concentric left ventricular hypertrophy. Left ventricular diastolic parameters are consistent with Grade III diastolic dysfunction (restrictive). Right Ventricle: The right ventricular size is normal. Mildly increased right ventricular wall thickness. Right ventricular systolic function is normal. Left Atrium: Left atrial size was normal in size. Right Atrium: Right atrial size was normal in size. Pericardium: There is no evidence of pericardial effusion. Mitral  Valve: The mitral valve is normal in structure. Mild mitral valve regurgitation. Tricuspid Valve: The tricuspid valve is normal in structure. Tricuspid valve regurgitation is mild. Aortic Valve: The aortic valve is calcified. There is moderate calcification of the aortic valve. Aortic valve regurgitation is not visualized. Aortic valve sclerosis/calcification is present, without any evidence of aortic stenosis. Aortic valve mean gradient measures 3.0 mmHg. Aortic valve peak gradient measures 4.8 mmHg. Aortic valve area, by VTI measures 2.31 cm. Pulmonic Valve: The pulmonic valve was normal in structure. Pulmonic valve regurgitation is not visualized. Aorta: The ascending aorta was not well visualized. IAS/Shunts: No atrial level shunt detected by color flow Doppler.  LEFT VENTRICLE PLAX 2D LVIDd:         3.80 cm LVIDs:         2.40 cm LV PW:         1.30 cm LV IVS:        1.50 cm LVOT diam:     2.00 cm LV SV:         48 LV SV Index:   23 LVOT Area:     3.14 cm  RIGHT VENTRICLE RV Basal diam:  4.30 cm RV Mid diam:    3.30 cm LEFT ATRIUM           Index        RIGHT ATRIUM           Index LA diam:      3.70 cm 1.73 cm/m   RA Area:     22.60 cm LA Vol (A2C): 66.7 ml 31.22 ml/m  RA Volume:   66.70  ml  31.22 ml/m LA Vol (A4C): 50.6 ml 23.69 ml/m  AORTIC VALVE AV Area (Vmax):    2.19 cm AV Area (Vmean):   2.18 cm AV Area (VTI):     2.31 cm AV Vmax:           110.00 cm/s AV Vmean:          77.000 cm/s AV VTI:            0.209 m AV Peak Grad:      4.8 mmHg AV Mean Grad:      3.0 mmHg LVOT Vmax:         76.60 cm/s LVOT Vmean:        53.500 cm/s LVOT VTI:          0.154 m LVOT/AV VTI ratio: 0.74  AORTA Ao Root diam: 3.50 cm MITRAL VALVE               TRICUSPID VALVE MV Area (PHT): 3.64 cm    TR Peak grad:   30.9 mmHg MV Decel Time: 209 msec    TR Vmax:        278.00 cm/s MV E velocity: 99.45 cm/s MV A velocity: 26.70 cm/s  SHUNTS MV E/A ratio:  3.72        Systemic VTI:  0.15 m                            Systemic Diam: 2.00 cm Alwyn Pea MD Electronically signed by Alwyn Pea MD Signature Date/Time: 12/18/2022/2:04:44 PM    Final    CT Angio Chest/Abd/Pel for Dissection W and/or Wo Contrast  Result Date: 12/16/2022 CLINICAL DATA:  Acute aortic syndrome suspected. EXAM: CT ANGIOGRAPHY CHEST, ABDOMEN AND PELVIS TECHNIQUE: Non-contrast CT of the chest was initially obtained. Multidetector CT  imaging through the chest, abdomen and pelvis was performed using the standard protocol during bolus administration of intravenous contrast. Multiplanar reconstructed images and MIPs were obtained and reviewed to evaluate the vascular anatomy. RADIATION DOSE REDUCTION: This exam was performed according to the departmental dose-optimization program which includes automated exposure control, adjustment of the mA and/or kV according to patient size and/or use of iterative reconstruction technique. CONTRAST:  75mL OMNIPAQUE IOHEXOL 350 MG/ML SOLN COMPARISON:  CT abdomen and pelvis 12/09/2006 FINDINGS: CTA CHEST FINDINGS Cardiovascular: There is adequate opacification of the thoracic aorta. The ascending aorta is dilated measuring 4.3 x 3.7 cm. There is no evidence for aortic dissection. Descending thoracic aorta  is mildly dilated measuring up to 3.1 cm. There is mild calcified atherosclerotic disease throughout the aorta. The heart is mildly enlarged. There are atherosclerotic calcifications of the coronary arteries. There is no pericardial effusion. Mediastinum/Nodes: There are no large and enlarged paratracheal lymph nodes. The largest measures 2.8 by 2.8 cm. There is an enlarged subcarinal lymph node measuring 1.6 cm short axis. Enlarged posterior mediastinal lymph nodes adjacent to the esophagus measure 2.0 x 2.4 by 4.6 cm. Enlarged right hilar lymph nodes measure up to 1.8 cm short axis. There are numerous nonenlarged prevascular lymph nodes. Visualized esophagus and thyroid gland are within normal limits. Lungs/Pleura: Peripheral reticular opacities are seen throughout both lungs with some associated honeycombing and ground-glass opacities also seen peripherally. Focal ground-glass opacity seen in the right lower lobe measuring 3.3 by 2.0 by 3.3 cm. There is no pleural effusion or pneumothorax. Musculoskeletal: No chest wall abnormality. No acute or significant osseous findings. Review of the MIP images confirms the above findings. CTA ABDOMEN AND PELVIS FINDINGS VASCULAR Aorta: Normal caliber aorta without aneurysm, dissection, vasculitis or significant stenosis. There is calcified atherosclerotic disease throughout the aorta. Celiac: Patent without evidence of aneurysm, dissection, vasculitis or significant stenosis. SMA: Patent without evidence of aneurysm, dissection, vasculitis or significant stenosis. Renals: Both renal arteries are patent without evidence of aneurysm, dissection, vasculitis, fibromuscular dysplasia or significant stenosis. IMA: Patent without evidence of aneurysm, dissection, vasculitis or significant stenosis. Inflow: Patent without evidence of aneurysm, dissection, vasculitis or significant stenosis. Veins: No obvious venous abnormality within the limitations of this arterial phase study.  Review of the MIP images confirms the above findings. NON-VASCULAR Hepatobiliary: No focal liver abnormality is seen. Status post cholecystectomy. No biliary dilatation. Pancreas: Unremarkable. No pancreatic ductal dilatation or surrounding inflammatory changes. Spleen: Normal in size without focal abnormality. Adrenals/Urinary Tract: Posterior left bladder diverticulum is present. The bladder is distended, but otherwise within normal limits. There is a rounded low-attenuation area in the inferior pole the right kidney measuring 4.8 cm in diameter this is decreased in size. This measures 31 Hounsfield units. Stomach/Bowel: Stomach is within normal limits. Appendix appears normal. No evidence of bowel wall thickening, distention, or inflammatory changes. There is sigmoid colon diverticulosis. Lymphatic: No enlarged lymph nodes. Reproductive: Prostate gland is enlarged. Other: There is a small fat containing left inguinal hernia. There is no free fluid. Musculoskeletal: Degenerative changes affect the spine. Review of the MIP images confirms the above findings. IMPRESSION: 1. No evidence for aortic dissection or aneurysm. 2. Ascending thoracic aortic aneurysm measuring 4.3 x 3.7 cm. Recommend annual imaging followup by CTA or MRA. This recommendation follows 2010 ACCF/AHA/AATS/ACR/ASA/SCA/SCAI/SIR/STS/SVM Guidelines for the Diagnosis and Management of Patients with Thoracic Aortic Disease. Circulation. 2010; 121: H086-V784. Aortic aneurysm NOS (ICD10-I71.9) 3. Pathologic mediastinal and right hilar lymphadenopathy of uncertain etiology. 4. Findings compatible  with interstitial lung disease. 5. Ground-glass opacity in the right lower lobe measuring 3.3 cm. This may be infectious/inflammatory, but neoplasm can not be excluded. 6. 4.8 cm indeterminate right renal lesion, possibly complex cysts. Recommend further evaluation with dedicated CT or MRI. 7. Bladder diverticulum. 8. Colonic diverticulosis. 9. Prostatomegaly.  10. Small fat containing left inguinal hernia. Electronically Signed   By: Darliss Cheney M.D.   On: 12/16/2022 23:30   DG Chest 2 View  Result Date: 12/16/2022 CLINICAL DATA:  Chest pain EXAM: CHEST - 2 VIEW COMPARISON:  08/25/2012 FINDINGS: Asymmetric bilateral interstitial pulmonary infiltrates are present, likely infectious or inflammatory in the acute setting. No pneumothorax or pleural effusion. Cardiac size is at the upper limits of normal. Right paratracheal soft tissue opacity likely represents vascular shadow. No acute bone abnormality. IMPRESSION: 1. Asymmetric bilateral interstitial pulmonary infiltrates, likely infectious or inflammatory in the acute setting. Electronically Signed   By: Helyn Numbers M.D.   On: 12/16/2022 21:31     Family Communication: Wife and son at bedside, updated regarding his care.  Disposition: Status is: Inpatient Remains inpatient appropriate because: Pneumonia, CHF, uncontrolled BP management.  Planned Discharge Destination: Home with Home Health    Time spent: 43 minutes  Author: Marcelino Duster, MD 12/18/2022 3:55 PM  For on call review [email or web address].

## 2022-12-18 NOTE — Consult Note (Signed)
[facility] CLINIC CARDIOLOGY CONSULT NOTE       Patient ID: Craig Gilmore MRN: [id number] DOB/AGE: Dec 12, 1934 88 y.o.  Admit date: 12/16/2022 Referring Physician Dr. Clide Dales Primary Physician Dr. Burnett Sheng Primary Cardiologist Dr. Rudi Heap Reason for Consultation ?heart failure  HPI: Craig Gilmore is a 88 y.o. male  with a past medical history of chronic atrial fibrillation on warfarin, hypertension, hyperlipidemia, OSA on CPAP, CKD stage IIIb who presented to the ED on 12/16/2022 for abdominal pain. Cardiology was consulted for further evaluation of possible heart failure.   Patient reports that since last Friday he noticed that he was feeling some shortness of breath.  His wife reports that earlier in the week he had some issues with diarrhea.  He also had reduced appetite.  Patient and his wife report that on Monday she noticed that he was very "fidgety" and he was complaining of some shortness of breath and so she took him to urgent care for evaluation.  At that time he was noted to be extremely hypertensive and was sent to the ED for further evaluation.  In the ED he also reported abdominal pain located mostly around his bellybutton with some radiation to his back.  Workup in the ED notable for bilateral infiltrates on chest x-ray.  CTA of the chest demonstrated ascending thoracic aortic aneurysm with no evidence of dissection.  CT chest also noted right lower lobe groundglass opacity.  Patient was started on antibiotics and blood pressure medications to improve control.  BNP was checked early this morning and found to be mildly elevated at 250.  At the time of my evaluation this afternoon the patient is resting comfortably in bedside chair with wife and son present at bedside.  He reports that overall he feels improved but still experiencing some intermittent shortness of breath and abdominal discomfort.  He does have lower extremity edema on exam, his wife reports that his legs have  looked like this for some time.  Patient denies any chest pain or palpitations.  Blood pressure overall improved with adjustments to medication regimen.  He asked multiple times about when he would be able to go home.  Review of systems complete and found to be negative unless listed above    Past Medical History:  Diagnosis Date   Chronic atrial fibrillation (HCC)    a.) CHA2DS2VASc = 3 (age x 2, HTN);  b.) s/p DCCV 08/20/2004 --> 50 J x 1 and 100 J x 1 --> coverted with SB. (+) post-procedure nausea; c.) rate/rhythm maintained without pharmacological intervention; chronically anticoagulated with warfarin   Colonic polyp    COPD (chronic obstructive pulmonary disease) (HCC)    Depression    Dyspnea    Edema    GERD (gastroesophageal reflux disease)    Gout    Hemorrhoids    Hyperlipidemia    Hypertension    Insomnia    a.) on hypnotic (zolpidem)   Long term current use of anticoagulant    a.) warfarin   OSA on CPAP    Pneumonia    Pulmonary HTN (HCC) 02/14/2016   a.) TTE 02/14/2016: RVSP 37.5 mmHg   Sick sinus syndrome (HCC)    Squamous cell carcinoma in situ 10/03/2009   L medial infrapectoral   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   12/16/2022 at am   febuxostat (ULORIC) 40 MG tablet Take 40 mg by mouth every evening.   12/15/2022 at 2000   fluticasone (FLONASE) 50 MCG/ACT nasal spray Place 1 spray into both nostrils at bedtime.   12/15/2022 at Unknown   ipratropium (ATROVENT) 0.03 %  nasal spray Place 2 sprays into both nostrils every morning.   12/16/2022 at am   omeprazole (PRILOSEC OTC) 20 MG tablet Take 20 mg by mouth daily before breakfast.   12/16/2022 at am   sertraline (ZOLOFT) 100 MG tablet Take 150 mg by mouth every morning.   12/16/2022 at 0800   zolpidem (AMBIEN) 10 MG tablet Take 10 mg by mouth at bedtime.   12/15/2022 at Unknown   Choline Fenofibrate (FENOFIBRIC ACID) 135 MG CPDR Take 1 capsule by mouth  daily (Patient taking differently: Take 1 tablet by mouth every evening. Take 1 capsule by mouth  daily) 90 capsule 3 12/15/2022 at pm   levocetirizine (XYZAL) 5 MG tablet Take 5 mg by mouth every evening.   12/15/2022 at 2000   warfarin (COUMADIN) 2.5 MG tablet Take as directed by anticoagulation clinic (Patient taking differently: Take 2.5 mg by mouth every evening. Take as directed by anticoagulation clinic) 30 tablet 1 12/15/2022 at 1930   Social History   Socioeconomic History   Marital status: Married    Spouse name: Not on file   Number of children: Not on file   Years of education: Not on file   Highest education level: Not on file  Occupational History   Not on file  Tobacco Use   Smoking status: Former    Current packs/day: 0.00    Average packs/day: 1.5 packs/day for 50.0 years (75.0 ttl pk-yrs)    Types: Cigarettes    Start date: 04/09/1939    Quit date: 04/08/1989    Years since quitting: 33.7   Smokeless tobacco: Never  Vaping Use   Vaping status: Never Used  Substance and Sexual Activity   Alcohol use: No   Drug use: No   Sexual activity: Not on file  Other Topics Concern   Not on file  Social History Narrative   Not on file   Social Determinants of Health   Financial Resource Strain: Low Risk  (05/27/2022)   Received from [facility], [facility]   Overall Financial Resource Strain (CARDIA)    Difficulty of Paying Living Expenses: Not very hard  Food Insecurity: No Food Insecurity (12/17/2022)   Hunger  Vital Sign    Worried About Running Out of Food in the Last Year: Never true    Ran Out of Food in the Last Year: Never true  Transportation Needs: No Transportation Needs (12/17/2022)   PRAPARE - Administrator, Civil Service (Medical): No    Lack of Transportation (Non-Medical): No  Physical Activity: Not on file  Stress: Not on file  Social Connections: Not on file  Intimate Partner Violence: Not At Risk (12/17/2022)   Humiliation, Afraid, Rape, and Kick questionnaire    Fear of Current or Ex-Partner: No    Emotionally  Abused: No    Physically Abused: No    Sexually Abused: No    Family History  Problem Relation Age of Onset   Diabetes Mother    Heart disease Mother    Heart disease Father    Aneurysm Father      Vitals:   12/18/22 0812 12/18/22 1233 12/18/22 1259 12/18/22 1532  BP: (!) 150/81 (!) 182/104 (!) 177/89 (!) 166/79  Pulse: 88 89 84 79  Resp: 16 16  18   Temp: (!) 97.5 F (36.4 C) 97.8 F (36.6 C)  97.6 F (36.4 C)  TempSrc: Oral Oral  Oral  SpO2: 98% 99%  99%  Weight:      Height:        PHYSICAL EXAM General: Well-appearing, well nourished, in no acute distress sitting upright in bedside chair with wife and son present at bedside. HEENT: Normocephalic and atraumatic. Neck: No JVD.  Lungs: Normal respiratory effort on room air. Clear bilaterally to auscultation. No wheezes, crackles, rhonchi.  Heart: Irregularly irregular, controlled rate. Normal S1 and S2 without gallops or murmurs.  Abdomen: Non-distended appearing.  Msk: Normal strength and tone for age. Extremities: Warm and well perfused. No clubbing, cyanosis.  1+ pitting edema.  Neuro: Alert and oriented X 3. Psych: Answers questions appropriately.   Labs: Basic Metabolic Panel: Recent Labs    12/17/22 0506 12/18/22 0341  NA 135 134*  K 3.5 3.5  CL 103 105  CO2 22 19*  GLUCOSE 97 98  BUN 36* 33*  CREATININE 1.64* 1.61*  CALCIUM 8.6* 8.5*   Liver Function Tests: Recent  Labs    12/16/22 1907 12/17/22 0506  AST 36 29  ALT 23 18  ALKPHOS 69 53  BILITOT 1.0 0.9  PROT 7.8 6.4*  ALBUMIN 4.0 3.3*   Recent Labs    12/16/22 1907  LIPASE 143*   CBC: Recent Labs    12/17/22 0506 12/18/22 0341  WBC 9.6 9.1  HGB 12.5* 13.0  HCT 39.2 39.9  MCV 91.0 88.7  PLT 222 231   Cardiac Enzymes: Recent Labs    12/16/22 1907 12/16/22 2100  TROPONINIHS 34* 43*   BNP: Recent Labs    12/18/22 0341  BNP 250.6*   D-Dimer: No results for input(s): "DDIMER" in the last 72 hours. Hemoglobin A1C: No results for input(s): "HGBA1C" in the last 72 hours. Fasting Lipid Panel: No results for input(s): "CHOL", "HDL", "LDLCALC", "TRIG", "CHOLHDL", "LDLDIRECT" in the last 72 hours. Thyroid Function Tests: No results for input(s): "TSH", "T4TOTAL", "T3FREE", "THYROIDAB" in the last 72 hours.  Invalid input(s): "FREET3" Anemia Panel: No results for input(s): "VITAMINB12", "FOLATE", "FERRITIN", "TIBC", "IRON", "RETICCTPCT" in the last 72 hours.   Radiology: ECHOCARDIOGRAM COMPLETE  Result Date: 12/18/2022    ECHOCARDIOGRAM REPORT   Patient Name:   Craig Gilmore Date of Exam: 12/18/2022 Medical Rec #:  [id number]     Height:       67.0 in Accession #:    [id number]    Weight:       227.5 lb Date of Birth:  02/06/1935      BSA:          2.136 m Patient Age:    88 years      BP:           130/81 mmHg Patient Gender: M             HR:           88 bpm. Exam  Location:  [facility] Procedure: 2D Echo, Cardiac Doppler and Color Doppler Indications:     Dyspnea R06.00  History:         Patient has no prior history of Echocardiogram examinations.                  Pulmonary HTN; Risk Factors:Hypertension.  Sonographer:     Cristela Blue Referring Phys:  [id number] Marcelino Duster Diagnosing Phys: Alwyn Pea MD IMPRESSIONS  1. Left ventricular ejection fraction, by estimation, is 65 to 70%. The left ventricle has normal function. The left ventricle has no regional wall motion  abnormalities. There is moderate concentric left ventricular hypertrophy. Left ventricular diastolic parameters are consistent with Grade III diastolic dysfunction (restrictive).  2. Right ventricular systolic function is normal. The right ventricular size is normal. Mildly increased right ventricular wall thickness.  3. The mitral valve is normal in structure. Mild mitral valve regurgitation.  4. The aortic valve is calcified. There is moderate calcification of the aortic valve. Aortic valve regurgitation is not visualized. Aortic valve sclerosis/calcification is present, without any evidence of aortic stenosis. Conclusion(s)/Recommendation(s): Poor windows for evaluation of left ventricular function by transthoracic echocardiography. Would recommend an alternative means of evaluation. FINDINGS  Left Ventricle: Left ventricular ejection fraction, by estimation, is 65 to 70%. The left ventricle has normal function. The left ventricle has no regional wall motion abnormalities. The left ventricular internal cavity size was normal in size. There is  moderate concentric left ventricular hypertrophy. Left ventricular diastolic parameters are consistent with Grade III diastolic dysfunction (restrictive). Right Ventricle: The right ventricular size is normal. Mildly increased right ventricular wall thickness. Right ventricular systolic function is normal. Left Atrium: Left atrial size was normal in size. Right Atrium: Right atrial size was normal in size. Pericardium: There is no evidence of pericardial effusion. Mitral Valve: The mitral valve is normal in structure. Mild mitral valve regurgitation. Tricuspid Valve: The tricuspid valve is normal in structure. Tricuspid valve regurgitation is mild. Aortic Valve: The aortic valve is calcified. There is moderate calcification of the aortic valve. Aortic valve regurgitation is not visualized. Aortic valve sclerosis/calcification is present, without any evidence of aortic  stenosis. Aortic valve mean gradient measures 3.0 mmHg. Aortic valve peak gradient measures 4.8 mmHg. Aortic valve area, by VTI measures 2.31 cm. Pulmonic Valve: The pulmonic valve was normal in structure. Pulmonic valve regurgitation is not visualized. Aorta: The ascending aorta was not well visualized. IAS/Shunts: No atrial level shunt detected by color flow Doppler.  LEFT VENTRICLE PLAX 2D LVIDd:         3.80 cm LVIDs:         2.40 cm LV PW:         1.30 cm LV IVS:        1.50 cm LVOT diam:     2.00 cm LV SV:         48 LV SV Index:   23 LVOT Area:     3.14 cm  RIGHT VENTRICLE RV Basal diam:  4.30 cm RV Mid diam:    3.30 cm LEFT ATRIUM           Index        RIGHT ATRIUM           Index LA diam:      3.70 cm 1.73 cm/m   RA Area:     22.60 cm LA Vol (A2C): 66.7 ml 31.22 ml/m  RA Volume:   66.70 ml  31.22 ml/m  LA Vol (A4C): 50.6 ml 23.69 ml/m  AORTIC VALVE AV Area (Vmax):    2.19 cm AV Area (Vmean):   2.18 cm AV Area (VTI):     2.31 cm AV Vmax:           110.00 cm/s AV Vmean:          77.000 cm/s AV VTI:            0.209 m AV Peak Grad:      4.8 mmHg AV Mean Grad:      3.0 mmHg LVOT Vmax:         76.60 cm/s LVOT Vmean:        53.500 cm/s LVOT VTI:          0.154 m LVOT/AV VTI ratio: 0.74  AORTA Ao Root diam: 3.50 cm MITRAL VALVE               TRICUSPID VALVE MV Area (PHT): 3.64 cm    TR Peak grad:   30.9 mmHg MV Decel Time: 209 msec    TR Vmax:        278.00 cm/s MV E velocity: 99.45 cm/s MV A velocity: 26.70 cm/s  SHUNTS MV E/A ratio:  3.72        Systemic VTI:  0.15 m                            Systemic Diam: 2.00 cm Alwyn Pea MD Electronically signed by Alwyn Pea MD Signature Date/Time: 12/18/2022/2:04:44 PM    Final    CT Angio Chest/Abd/Pel for Dissection W and/or Wo Contrast  Result Date: 12/16/2022 CLINICAL DATA:  Acute aortic syndrome suspected. EXAM: CT ANGIOGRAPHY CHEST, ABDOMEN AND PELVIS TECHNIQUE: Non-contrast CT of the chest was initially obtained. Multidetector CT imaging  through the chest, abdomen and pelvis was performed using the standard protocol during bolus administration of intravenous contrast. Multiplanar reconstructed images and MIPs were obtained and reviewed to evaluate the vascular anatomy. RADIATION DOSE REDUCTION: This exam was performed according to the departmental dose-optimization program which includes automated exposure control, adjustment of the mA and/or kV according to patient size and/or use of iterative reconstruction technique. CONTRAST:  75mL OMNIPAQUE IOHEXOL 350 MG/ML SOLN COMPARISON:  CT abdomen and pelvis 12/09/2006 FINDINGS: CTA CHEST FINDINGS Cardiovascular: There is adequate opacification of the thoracic aorta. The ascending aorta is dilated measuring 4.3 x 3.7 cm. There is no evidence for aortic dissection. Descending thoracic aorta is mildly dilated measuring up to 3.1 cm. There is mild calcified atherosclerotic disease throughout the aorta. The heart is mildly enlarged. There are atherosclerotic calcifications of the coronary arteries. There is no pericardial effusion. Mediastinum/Nodes: There are no large and enlarged paratracheal lymph nodes. The largest measures 2.8 by 2.8 cm. There is an enlarged subcarinal lymph node measuring 1.6 cm short axis. Enlarged posterior mediastinal lymph nodes adjacent to the esophagus measure 2.0 x 2.4 by 4.6 cm. Enlarged right hilar lymph nodes measure up to 1.8 cm short axis. There are numerous nonenlarged prevascular lymph nodes. Visualized esophagus and thyroid gland are within normal limits. Lungs/Pleura: Peripheral reticular opacities are seen throughout both lungs with some associated honeycombing and ground-glass opacities also seen peripherally. Focal ground-glass opacity seen in the right lower lobe measuring 3.3 by 2.0 by 3.3 cm. There is no pleural effusion or pneumothorax. Musculoskeletal: No chest wall abnormality. No acute or significant osseous findings. Review of the MIP images confirms the above  findings. CTA ABDOMEN AND PELVIS FINDINGS VASCULAR Aorta: Normal caliber aorta without aneurysm, dissection, vasculitis or significant stenosis. There is calcified atherosclerotic disease throughout the aorta. Celiac: Patent without evidence of aneurysm, dissection, vasculitis or significant stenosis. SMA: Patent without evidence of aneurysm, dissection, vasculitis or significant stenosis. Renals: Both renal arteries are patent without evidence of aneurysm, dissection, vasculitis, fibromuscular dysplasia or significant stenosis. IMA: Patent without evidence of aneurysm, dissection, vasculitis or significant stenosis. Inflow: Patent without evidence of aneurysm, dissection, vasculitis or significant stenosis. Veins: No obvious venous abnormality within the limitations of this arterial phase study. Review of the MIP images confirms the above findings. NON-VASCULAR Hepatobiliary: No focal liver abnormality is seen. Status post cholecystectomy. No biliary dilatation. Pancreas: Unremarkable. No pancreatic ductal dilatation or surrounding inflammatory changes. Spleen: Normal in size without focal abnormality. Adrenals/Urinary Tract: Posterior left bladder diverticulum is present. The bladder is distended, but otherwise within normal limits. There is a rounded low-attenuation area in the inferior pole the right kidney measuring 4.8 cm in diameter this is decreased in size. This measures 31 Hounsfield units. Stomach/Bowel: Stomach is within normal limits. Appendix appears normal. No evidence of bowel wall thickening, distention, or inflammatory changes. There is sigmoid colon diverticulosis. Lymphatic: No enlarged lymph nodes. Reproductive: Prostate gland is enlarged. Other: There is a small fat containing left inguinal hernia. There is no free fluid. Musculoskeletal: Degenerative changes affect the spine. Review of the MIP images confirms the above findings. IMPRESSION: 1. No evidence for aortic dissection or aneurysm. 2.  Ascending thoracic aortic aneurysm measuring 4.3 x 3.7 cm. Recommend annual imaging followup by CTA or MRA. This recommendation follows 2010 ACCF/AHA/AATS/ACR/ASA/SCA/SCAI/SIR/STS/SVM Guidelines for the Diagnosis and Management of Patients with Thoracic Aortic Disease. Circulation. 2010; 121: Z610-R604. Aortic aneurysm NOS (ICD10-I71.9) 3. Pathologic mediastinal and right hilar lymphadenopathy of uncertain etiology. 4. Findings compatible with interstitial lung disease. 5. Ground-glass opacity in the right lower lobe measuring 3.3 cm. This may be infectious/inflammatory, but neoplasm can not be excluded. 6. 4.8 cm indeterminate right renal lesion, possibly complex cysts. Recommend further evaluation with dedicated CT or MRI. 7. Bladder diverticulum. 8. Colonic diverticulosis. 9. Prostatomegaly. 10. Small fat containing left inguinal hernia. Electronically Signed   By: Darliss Cheney M.D.   On: 12/16/2022 23:30   DG Chest 2 View  Result Date: 12/16/2022 CLINICAL DATA:  Chest pain EXAM: CHEST - 2 VIEW COMPARISON:  08/25/2012 FINDINGS: Asymmetric bilateral interstitial pulmonary infiltrates are present, likely infectious or inflammatory in the acute setting. No pneumothorax or pleural effusion. Cardiac size is at the upper limits of normal. Right paratracheal soft tissue opacity likely represents vascular shadow. No acute bone abnormality. IMPRESSION: 1. Asymmetric bilateral interstitial pulmonary infiltrates, likely infectious or inflammatory in the acute setting. Electronically Signed   By: Helyn Numbers M.D.   On: 12/16/2022 21:31    ECHO pending  TELEMETRY reviewed by me [facility]) 12/18/2022: Atrial fibrillation rate 60s, PVCs  EKG reviewed by me: atrial fibrillation rate 92 bpm  Data reviewed by me [facility]) 12/18/2022: last 24h vitals tele labs imaging I/O ED provider note, admission H&P, hospitalist progress note  Principal Problem:   Sepsis due to pneumonia [facility]) Active Problems:   Chronic atrial  fibrillation (HCC)   Hyperlipidemia   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 tophus   Stage 3b chronic kidney disease (HCC)   Sick sinus syndrome (HCC)   Interstitial lung disease (HCC)   Thoracic aortic aneurysm (HCC)    ASSESSMENT AND PLAN:  KAINEN YAX is a  88 y.o. male  with a past medical history of chronic atrial fibrillation on warfarin, hypertension, hyperlipidemia, OSA on CPAP, CKD stage IIIb who presented to the ED on 12/16/2022 for abdominal pain. Cardiology was consulted for further evaluation of possible heart failure.   # ?Heart failure # Hypertensive urgency Patient presenting with shortness of breath since Friday and lower extremity edema of uncertain chronicity.  Some concern for heart failure, BNP checked and was mildly elevated at 250.  Echo this admission with preserved EF, grade 3 diastolic dysfunction. -Will give one-time dose of IV Lasix 20 mg this afternoon. -Strict I&Os, check weight tomorrow.  -BP better controlled overall, can consider increasing hydralazine to tid.  -Continue losartan 50 mg daily, hydralazine 50 mg twice daily  # Right lower lobe pneumonia Patient with chest x-ray and CT both concerning for right lower lobe pneumonia. -Management per primary  # Chronic atrial fibrillation # Bradycardia Patient with a history of chronic atrial fibrillation and bradycardia while on beta-blockers in the past.  Rate has been controlled on telemetry in the 60s today. -Continue warfarin for stroke risk reduction -Avoid AV nodal blockers given history of bradycardia and relatively controlled rate.  # Chronic kidney disease stage IIIb Baseline creatinine around 1.8, improved at 1.61 and labs today. -Continue to monitor renal function closely with diuresis.  # Ascending thoracic aortic aneurysm Incidentally noted on CTA chest, measuring 4.3 x 3.7 cm. -Patient will need annual monitoring of aneurysm with CTA or MRI. -Strict BP control.   This patient's plan  of care was discussed and created with Dr. Juliann Pares and he is in agreement.  Signed: Gale Journey, PA-C  12/18/2022, 4:43 PM [facility]

## 2022-12-18 NOTE — Progress Notes (Addendum)
Pt was given 20 mg of IV lasix once today at 1749 but per pt is unable to pee. Bladder scan showed 736 at 2115: NP Jon Billings made aware. Will continue to monitor.  Update 2236: Pt and wife agreed to do in and out cath after only voiding 100cc. NP Jon Billings made aware. Will continue to monitor.  Update 2236: See new order. Will continue to monitor.  Update 1140: Pt was in and out cath and only had a 50 cc of pee drained from the catheter with a small tint of red color. When trying to advance the catheter a resistance if felt and its seems the the catheter wont get advance and just coil inside. NP Jon Billings made aware. Will continue to monitor.  Update 0010: Coude was placed per order. Will continue to monitor.  Update 0210: Pt is resltess at this time. VSS except BP 170/103 MAP 123 HR 62. Pt is complaining of 10/10 pain at the head of penis where foley was inserted. NP Morrsion amde aware. Will contineut to monitor.  Update [id number]: Please see new orders  Will continue tom monitor.  Update 0411: Pt still restless and trying to pull the foley out. Pt have a tintt of red color on the foley bag. NP Jon Billings made aware. Will continue to monitor.  Update 0500: NP Jon Billings at bedside. Will continue to monitor.  Update 0515: See new orders. Will continue to monitor.  Update [phone]: See new order. Will continue to monitor/

## 2022-12-18 NOTE — Progress Notes (Signed)
ANTICOAGULATION CONSULT NOTE  Pharmacy Consult for Warfarin  Indication: atrial fibrillation  Allergies  Allergen Reactions   Baycol [Cerivastatin]    Pravastatin     Patient Measurements: Height: 5\' 7"  (170.2 cm) Weight: 103.2 kg (227 lb 8.2 oz) IBW/kg (Calculated) : 66.1  Vital Signs: Temp: 97.5 F (36.4 C) (09/11 0812) Temp Source: Oral (09/11 0812) BP: 150/81 (09/11 0812) Pulse Rate: 88 (09/11 0812)  Labs: Recent Labs    12/16/22 1907 12/16/22 2100 12/16/22 2102 12/17/22 0506 12/18/22 0341  HGB 13.7  --   --  12.5* 13.0  HCT 42.5  --   --  39.2 39.9  PLT 255  --   --  222 231  LABPROT  --   --  27.7* 29.5* 34.7*  INR  --   --  2.6* 2.8* 3.4*  CREATININE 1.87*  --   --  1.64* 1.61*  TROPONINIHS 34* 43*  --   --   --     Estimated Creatinine Clearance: 37.7 mL/min (A) (by C-G formula based on SCr of 1.61 mg/dL (H)).   Medical History: Past Medical History:  Diagnosis Date   Chronic atrial fibrillation (HCC)    a.) CHA2DS2VASc = 3 (age x 2, HTN);  b.) s/p DCCV 08/20/2004 --> 50 J x 1 and 100 J x 1 --> coverted with SB. (+) post-procedure nausea; c.) rate/rhythm maintained without pharmacological intervention; chronically anticoagulated with warfarin   Colonic polyp    COPD (chronic obstructive pulmonary disease) (HCC)    Depression    Dyspnea    Edema    GERD (gastroesophageal reflux disease)    Gout    Hemorrhoids    Hyperlipidemia    Hypertension    Insomnia    a.) on hypnotic (zolpidem)   Long term current use of anticoagulant    a.) warfarin   OSA on CPAP    Pneumonia    Pulmonary HTN (HCC) 02/14/2016   a.) TTE 02/14/2016: RVSP 37.5 mmHg   Sick sinus syndrome (HCC)    Squamous cell carcinoma in situ 10/03/2009   L medial infrapectoral   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   L nose supratip   Stage 3b chronic kidney disease (CKD) (HCC)     Medications:  Medications Prior to Admission  Medication Sig Dispense Refill Last Dose    buPROPion (WELLBUTRIN XL) 150 MG 24 hr tablet Take 75 mg by mouth every morning.   12/16/2022 at am   febuxostat (ULORIC) 40 MG tablet Take 40 mg by mouth every evening.   12/15/2022 at 2000   fluticasone (FLONASE) 50 MCG/ACT nasal spray Place 1 spray into both nostrils at bedtime.   12/15/2022 at Unknown   ipratropium (ATROVENT) 0.03 % nasal spray Place 2 sprays into both nostrils every morning.   12/16/2022 at am   omeprazole (PRILOSEC OTC) 20 MG tablet Take 20 mg by mouth daily before breakfast.   12/16/2022 at am   sertraline (ZOLOFT) 100 MG tablet Take 150 mg by mouth every morning.   12/16/2022 at 0800   zolpidem (AMBIEN) 10 MG tablet Take 10 mg by mouth at bedtime.   12/15/2022 at Unknown   Choline Fenofibrate (FENOFIBRIC ACID) 135 MG CPDR Take 1 capsule by mouth  daily (Patient taking differently: Take 1 tablet by mouth every evening. Take 1 capsule by mouth  daily) 90 capsule 3 12/15/2022 at pm   levocetirizine (XYZAL) 5 MG tablet Take 5 mg by mouth every evening.   12/15/2022 at  2000   warfarin (COUMADIN) 2.5 MG tablet Take as directed by anticoagulation clinic (Patient taking differently: Take 2.5 mg by mouth every evening. Take as directed by anticoagulation clinic) 30 tablet 1 12/15/2022 at 1930    Assessment: Craig Gilmore is a 88 y.o. male presenting with abdominal pain. PMH significant for AF on warfarin, COPD, depression, GERD, sick sinus syndrome, gout. Patient was on Warfarin PTA per chart review. Last dose of Warfarin PTA was on 9/8 at 1930 (2.5 mg). Of note, INR was 2.6 (therapeutic) on home regimen on arrival. Pharmacy has been consulted to initiate and manage Warfarin.    DDI: azithromycin (may increase INR), fenofibrate (may increase INR)  Baseline Labs: PT 27.7, INR 2.6, Hgb 13.7, Hct 42.5, Plt 255    Goal of Therapy:  INR 2-3  Date    INR      Preceding Warfarin Dose    9/9 2.6 2.5 mg 9/10 2.8 2.5 mg 9/11 3.4 2 mg   Plan:  Hold warfarin today given supratherapeutic INR Check INR  daily until stable Check CBC at least weekly while on warfarin   Celene Squibb, PharmD Clinical Pharmacist 12/18/2022 8:23 AM

## 2022-12-19 DIAGNOSIS — R2243 Localized swelling, mass and lump, lower limb, bilateral: Secondary | ICD-10-CM | POA: Diagnosis not present

## 2022-12-19 DIAGNOSIS — I5031 Acute diastolic (congestive) heart failure: Secondary | ICD-10-CM

## 2022-12-19 DIAGNOSIS — G9341 Metabolic encephalopathy: Secondary | ICD-10-CM | POA: Diagnosis not present

## 2022-12-19 DIAGNOSIS — N183 Chronic kidney disease, stage 3 unspecified: Secondary | ICD-10-CM | POA: Diagnosis not present

## 2022-12-19 DIAGNOSIS — R7989 Other specified abnormal findings of blood chemistry: Secondary | ICD-10-CM | POA: Diagnosis not present

## 2022-12-19 DIAGNOSIS — E871 Hypo-osmolality and hyponatremia: Secondary | ICD-10-CM | POA: Diagnosis not present

## 2022-12-19 DIAGNOSIS — I7121 Aneurysm of the ascending aorta, without rupture: Secondary | ICD-10-CM | POA: Diagnosis not present

## 2022-12-19 DIAGNOSIS — I160 Hypertensive urgency: Secondary | ICD-10-CM | POA: Diagnosis not present

## 2022-12-19 DIAGNOSIS — J849 Interstitial pulmonary disease, unspecified: Secondary | ICD-10-CM | POA: Diagnosis not present

## 2022-12-19 DIAGNOSIS — N1832 Chronic kidney disease, stage 3b: Secondary | ICD-10-CM | POA: Diagnosis not present

## 2022-12-19 DIAGNOSIS — I495 Sick sinus syndrome: Secondary | ICD-10-CM | POA: Diagnosis not present

## 2022-12-19 DIAGNOSIS — I482 Chronic atrial fibrillation, unspecified: Secondary | ICD-10-CM | POA: Diagnosis not present

## 2022-12-19 LAB — URINALYSIS, ROUTINE W REFLEX MICROSCOPIC
Bacteria, UA: NONE SEEN
Bilirubin Urine: NEGATIVE
Glucose, UA: NEGATIVE mg/dL
Ketones, ur: NEGATIVE mg/dL
Leukocytes,Ua: NEGATIVE
Nitrite: NEGATIVE
Protein, ur: 100 mg/dL — AB
Specific Gravity, Urine: 1.014 (ref 1.005–1.030)
pH: 5 (ref 5.0–8.0)

## 2022-12-19 LAB — CBC
HCT: 41 % (ref 39.0–52.0)
Hemoglobin: 13.8 g/dL (ref 13.0–17.0)
MCH: 29.1 pg (ref 26.0–34.0)
MCHC: 33.7 g/dL (ref 30.0–36.0)
MCV: 86.5 fL (ref 80.0–100.0)
Platelets: 260 10*3/uL (ref 150–400)
RBC: 4.74 MIL/uL (ref 4.22–5.81)
RDW: 15.2 % (ref 11.5–15.5)
WBC: 14.1 10*3/uL — ABNORMAL HIGH (ref 4.0–10.5)
nRBC: 0 % (ref 0.0–0.2)

## 2022-12-19 LAB — CULTURE, BLOOD (ROUTINE X 2): Special Requests: ADEQUATE

## 2022-12-19 LAB — BASIC METABOLIC PANEL
Anion gap: 15 (ref 5–15)
BUN: 35 mg/dL — ABNORMAL HIGH (ref 8–23)
CO2: 19 mmol/L — ABNORMAL LOW (ref 22–32)
Calcium: 9.1 mg/dL (ref 8.9–10.3)
Chloride: 99 mmol/L (ref 98–111)
Creatinine, Ser: 1.79 mg/dL — ABNORMAL HIGH (ref 0.61–1.24)
GFR, Estimated: 36 mL/min — ABNORMAL LOW (ref >60)
Glucose, Bld: 105 mg/dL — ABNORMAL HIGH (ref 70–99)
Potassium: 3.5 mmol/L (ref 3.5–5.1)
Sodium: 133 mmol/L — ABNORMAL LOW (ref 135–145)

## 2022-12-19 LAB — PROTIME-INR
INR: 3.4 — ABNORMAL HIGH (ref 0.8–1.2)
Prothrombin Time: 34.8 s — ABNORMAL HIGH (ref 11.4–15.2)

## 2022-12-19 LAB — PSA: Prostatic Specific Antigen: 3.67 ng/mL (ref 0.00–4.00)

## 2022-12-19 MED ORDER — POTASSIUM CHLORIDE CRYS ER 20 MEQ PO TBCR
20.0000 meq | EXTENDED_RELEASE_TABLET | Freq: Once | ORAL | Status: AC
  Administered 2022-12-19: 20 meq via ORAL
  Filled 2022-12-19: qty 1

## 2022-12-19 MED ORDER — METHOCARBAMOL 500 MG PO TABS
500.0000 mg | ORAL_TABLET | Freq: Once | ORAL | Status: AC
  Administered 2022-12-19: 500 mg via ORAL
  Filled 2022-12-19: qty 1

## 2022-12-19 MED ORDER — PHENAZOPYRIDINE HCL 200 MG PO TABS
200.0000 mg | ORAL_TABLET | Freq: Once | ORAL | Status: AC
  Administered 2022-12-19: 200 mg via ORAL
  Filled 2022-12-19: qty 1

## 2022-12-19 MED ORDER — FUROSEMIDE 10 MG/ML IJ SOLN
20.0000 mg | Freq: Once | INTRAMUSCULAR | Status: AC
  Administered 2022-12-19: 20 mg via INTRAVENOUS
  Filled 2022-12-19: qty 2

## 2022-12-19 MED ORDER — CHLORHEXIDINE GLUCONATE CLOTH 2 % EX PADS
6.0000 | MEDICATED_PAD | Freq: Every day | CUTANEOUS | Status: DC
  Administered 2022-12-19 – 2022-12-20 (×2): 6 via TOPICAL

## 2022-12-19 MED ORDER — MORPHINE SULFATE (PF) 2 MG/ML IV SOLN
2.0000 mg | INTRAVENOUS | Status: DC | PRN
  Administered 2022-12-19 (×2): 2 mg via INTRAVENOUS
  Filled 2022-12-19: qty 1

## 2022-12-19 MED ORDER — HALOPERIDOL LACTATE 5 MG/ML IJ SOLN
1.0000 mg | Freq: Once | INTRAMUSCULAR | Status: DC
  Filled 2022-12-19: qty 1

## 2022-12-19 MED ORDER — TAMSULOSIN HCL 0.4 MG PO CAPS
0.4000 mg | ORAL_CAPSULE | Freq: Every day | ORAL | Status: DC
  Administered 2022-12-19: 0.4 mg via ORAL
  Filled 2022-12-19: qty 1

## 2022-12-19 MED ORDER — MORPHINE SULFATE (PF) 2 MG/ML IV SOLN
2.0000 mg | Freq: Once | INTRAVENOUS | Status: AC
  Administered 2022-12-19: 2 mg via INTRAVENOUS
  Filled 2022-12-19: qty 1

## 2022-12-19 MED ORDER — METHOCARBAMOL 500 MG PO TABS
750.0000 mg | ORAL_TABLET | Freq: Once | ORAL | Status: AC
  Administered 2022-12-19: 750 mg via ORAL
  Filled 2022-12-19: qty 1.5
  Filled 2022-12-19: qty 2

## 2022-12-19 NOTE — Progress Notes (Signed)
ANTICOAGULATION CONSULT NOTE  Pharmacy Consult for Warfarin  Indication: atrial fibrillation  Allergies  Allergen Reactions   Baycol [Cerivastatin]    Pravastatin     Patient Measurements: Height: 5\' 7"  (170.2 cm) Weight: 99.8 kg (220 lb 0.3 oz) IBW/kg (Calculated) : 66.1  Vital Signs: Temp: 97.5 F (36.4 C) (09/12 0851) Temp Source: Axillary (09/12 0450) BP: 139/88 (09/12 0851) Pulse Rate: 84 (09/12 0851)  Labs: Recent Labs    12/16/22 1907 12/16/22 2100 12/16/22 2102 12/17/22 0506 12/18/22 0341 12/19/22 0630  HGB 13.7  --   --  12.5* 13.0 13.8  HCT 42.5  --   --  39.2 39.9 41.0  PLT 255  --   --  222 231 260  LABPROT  --   --    < > 29.5* 34.7* 34.8*  INR  --   --    < > 2.8* 3.4* 3.4*  CREATININE 1.87*  --   --  1.64* 1.61* 1.79*  TROPONINIHS 34* 43*  --   --   --   --    < > = values in this interval not displayed.    Estimated Creatinine Clearance: 33.4 mL/min (A) (by C-G formula based on SCr of 1.79 mg/dL (H)).   Medical History: Past Medical History:  Diagnosis Date   Chronic atrial fibrillation (HCC)    a.) CHA2DS2VASc = 3 (age x 2, HTN);  b.) s/p DCCV 08/20/2004 --> 50 J x 1 and 100 J x 1 --> coverted with SB. (+) post-procedure nausea; c.) rate/rhythm maintained without pharmacological intervention; chronically anticoagulated with warfarin   Colonic polyp    COPD (chronic obstructive pulmonary disease) (HCC)    Depression    Dyspnea    Edema    GERD (gastroesophageal reflux disease)    Gout    Hemorrhoids    Hyperlipidemia    Hypertension    Insomnia    a.) on hypnotic (zolpidem)   Long term current use of anticoagulant    a.) warfarin   OSA on CPAP    Pneumonia    Pulmonary HTN (HCC) 02/14/2016   a.) TTE 02/14/2016: RVSP 37.5 mmHg   Sick sinus syndrome (HCC)    Squamous cell carcinoma in situ 10/03/2009   L medial infrapectoral   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   L nose supratip   Stage 3b chronic kidney disease (CKD)  (HCC)     Medications:  Medications Prior to Admission  Medication Sig Dispense Refill Last Dose   buPROPion (WELLBUTRIN XL) 150 MG 24 hr tablet Take 75 mg by mouth every morning.   12/16/2022 at am   febuxostat (ULORIC) 40 MG tablet Take 40 mg by mouth every evening.   12/15/2022 at 2000   fluticasone (FLONASE) 50 MCG/ACT nasal spray Place 1 spray into both nostrils at bedtime.   12/15/2022 at Unknown   ipratropium (ATROVENT) 0.03 % nasal spray Place 2 sprays into both nostrils every morning.   12/16/2022 at am   omeprazole (PRILOSEC OTC) 20 MG tablet Take 20 mg by mouth daily before breakfast.   12/16/2022 at am   sertraline (ZOLOFT) 100 MG tablet Take 150 mg by mouth every morning.   12/16/2022 at 0800   zolpidem (AMBIEN) 10 MG tablet Take 10 mg by mouth at bedtime.   12/15/2022 at Unknown   Choline Fenofibrate (FENOFIBRIC ACID) 135 MG CPDR Take 1 capsule by mouth  daily (Patient taking differently: Take 1 tablet by mouth every evening. Take 1 capsule  by mouth  daily) 90 capsule 3 12/15/2022 at pm   levocetirizine (XYZAL) 5 MG tablet Take 5 mg by mouth every evening.   12/15/2022 at 2000   warfarin (COUMADIN) 2.5 MG tablet Take as directed by anticoagulation clinic (Patient taking differently: Take 2.5 mg by mouth every evening. Take as directed by anticoagulation clinic) 30 tablet 1 12/15/2022 at 1930    Assessment: Craig Gilmore is a 88 y.o. male presenting with abdominal pain. PMH significant for AF on warfarin, COPD, depression, GERD, sick sinus syndrome, gout. Patient was on Warfarin PTA per chart review. Last dose of Warfarin PTA was on 9/8 at 1930 (2.5 mg). Of note, INR was 2.6 (therapeutic) on home regimen on arrival. Pharmacy has been consulted to initiate and manage Warfarin.    DDI: azithromycin (may increase INR), fenofibrate (may increase INR)  Baseline Labs: PT 27.7, INR 2.6, Hgb 13.7, Hct 42.5, Plt 255    Goal of Therapy:  INR 2-3  Date    INR      Preceding Warfarin Dose    9/9 2.6 2.5  mg 9/10 2.8 2.5 mg 9/11 3.4 2 mg 9/12 3.4 Hold    Plan:  Hold warfarin again today given supratherapeutic INR Check INR daily until stable Check CBC at least weekly while on warfarin   Celene Squibb, PharmD Clinical Pharmacist 12/19/2022 10:00 AM

## 2022-12-19 NOTE — Progress Notes (Signed)
Progress Note   Patient: Craig Gilmore EAV:[id number] DOB: 04/01/1935 DOA: 12/16/2022     3 DOS: the patient was seen and examined on 12/19/2022   Brief hospital course: Craig Gilmore is a 88 y.o. male with medical history significant of chronic atrial fibrillation on warfarin, COPD, depression, GERD, sick sinus syndrome, gout, who presented the ER today with abdominal pain.  In ED he was noted to have high blood pressure. CTA chest/ abdomen/ pelvis showed Ascending thoracic aortic aneurysm measuring 4.3 x 3.7 cm, pathologic mediastinal and right hilar lymphadenopathy of uncertain etiology, interstitial lung disease, ground-glass opacity in the right lower lobe.  Patient is admitted to the hospitalist service with impression of sepsis secondary to pneumonia, hypertensive urgency.  Assessment and Plan: Right lower lobe pneumonia- Sepsis has been ruled out. Discussed with family about lymphadenopathy, possible malignancy which need outpatient follow up. Continue Rocephin and azithromycin therapy. Continue DuoNebs, bronchodilators as needed.  Positive blood cultures: 1/4 bottles MRSE likely contamination. Will stop Vancomycin and monitor. Repeat blood cultures so far negative.  Epigastric abdominal discomfort- Possibly from right lower lobe pneumonia vs constipation, urinary retention. Did discuss about follow up of his ascending thoracic aortic aneurysm annually with patient's wife, son  Acute metabolic encephalopathy. Patient more confused. Avoid benzos, opiates if possible. Continue delirium precautions, supportive care.  Hypertensive urgency- improved IV hydralazine as needed ordered for SBP greater than 160. Continue Oral hydralazine 50 mg twice daily, Losartan 50 mg daily. Avoid beta-blockers given bradycardia history.  Acute diastolic heart failure: Given bilateral interstitial lung disease, pedal edema, orthopnea an echocardiogram ordered. Elevated BNP noted. He has been having  worsening shortness of breath since Friday. Echo shows grade 3 diastolic dysfunction. Cardiology evaluation appreciated. A dose of IV lasix 20mg  x2 given. His leg swelling, shortness of breath better.  Chronic atrial fibrillation on Coumadin therapy- Patient's heart rate stable. Continue Coumadin therapy as per pharmacy protocol.  Hyponatremia- Hypervolemic. Continue to trend sodium.  Chronic kidney disease stage IIIb- Creatinine stable. Baseline creatinine from Care everywhere 1.8 GFR 36. Avoid nephrotoxic drugs. Continue to monitor daily renal function.  Sick sinus syndrome- Patient does have bradycardia noted on the telemetry with lowest heart rate 36. Will avoid beta-blocker therapy. Continue telemetry monitoring.  Urinary retention-  Prostatomegaly on CT noted. PSA normal. Continue Foley care. Voiding trial once more alert, out of bed. Urology follow up outpatient.  Obesity with BMI 35.63 Diet, exercise and weight reduction counseling.      Subjective: Patient is seen and examined today morning. He is more confused today, mittens for safety noted. He had urinary retention, foley placed last night. He is restless states he "need to pee". Wife and son at bedside calming him.   Physical Exam: Vitals:   12/19/22 0500 12/19/22 0851 12/19/22 1233 12/19/22 1553  BP:  139/88 119/70 (!) 147/91  Pulse:  84 72 75  Resp:  (!) 22 20 20   Temp:  (!) 97.5 F (36.4 C) (!) 97.5 F (36.4 C) 97.6 F (36.4 C)  TempSrc:      SpO2:  94% 90% 99%  Weight: 99.8 kg     Height:       General - Elderly Caucasian male, restless, in distress HEENT - PERRLA, EOMI, atraumatic head, non tender sinuses. Lung - Clear, bibasal rales, rhonchi noted. Heart - S1, S2 heard, no murmurs, rubs, 1+ pedal edema. Abdomen - soft, non tender bowel sounds good. Foley in place. Neuro - Alert, awake and oriented, non focal  exam. Skin - Warm and dry. b/l mitttens noted. Data Reviewed:     Latest Ref  Rng & Units 12/19/2022    6:30 AM 12/18/2022    3:41 AM 12/17/2022    5:06 AM  CBC  WBC 4.0 - 10.5 K/uL 14.1  9.1  9.6   Hemoglobin 13.0 - 17.0 g/dL 86.5  78.4  69.6   Hematocrit 39.0 - 52.0 % 41.0  39.9  39.2   Platelets 150 - 400 K/uL 260  231  222        Latest Ref Rng & Units 12/19/2022    6:30 AM 12/18/2022    3:41 AM 12/17/2022    5:06 AM  BMP  Glucose 70 - 99 mg/dL 295  98  97   BUN 8 - 23 mg/dL 35  33  36   Creatinine 0.61 - 1.24 mg/dL 2.84  1.32  4.40   Sodium 135 - 145 mmol/L 133  134  135   Potassium 3.5 - 5.1 mmol/L 3.5  3.5  3.5   Chloride 98 - 111 mmol/L 99  105  103   CO2 22 - 32 mmol/L 19  19  22    Calcium 8.9 - 10.3 mg/dL 9.1  8.5  8.6     ECHOCARDIOGRAM COMPLETE  Result Date: 12/18/2022    ECHOCARDIOGRAM REPORT   Patient Name:   Craig Gilmore Date of Exam: 12/18/2022 Medical Rec #:  [id number]     Height:       67.0 in Accession #:    [id number]    Weight:       227.5 lb Date of Birth:  02/07/1935      BSA:          2.136 m Patient Age:    88 years      BP:           130/81 mmHg Patient Gender: M             HR:           88 bpm. Exam Location:  [facility] Procedure: 2D Echo, Cardiac Doppler and Color Doppler Indications:     Dyspnea R06.00  History:         Patient has no prior history of Echocardiogram examinations.                  Pulmonary HTN; Risk Factors:Hypertension.  Sonographer:     Cristela Blue Referring Phys:  [id number] Marcelino Duster Diagnosing Phys: Alwyn Pea MD IMPRESSIONS  1. Left ventricular ejection fraction, by estimation, is 65 to 70%. The left ventricle has normal function. The left ventricle has no regional wall motion abnormalities. There is moderate concentric left ventricular hypertrophy. Left ventricular diastolic parameters are consistent with Grade III diastolic dysfunction (restrictive).  2. Right ventricular systolic function is normal. The right ventricular size is normal. Mildly increased right ventricular wall thickness.  3. The mitral valve  is normal in structure. Mild mitral valve regurgitation.  4. The aortic valve is calcified. There is moderate calcification of the aortic valve. Aortic valve regurgitation is not visualized. Aortic valve sclerosis/calcification is present, without any evidence of aortic stenosis. Conclusion(s)/Recommendation(s): Poor windows for evaluation of left ventricular function by transthoracic echocardiography. Would recommend an alternative means of evaluation. FINDINGS  Left Ventricle: Left ventricular ejection fraction, by estimation, is 65 to 70%. The left ventricle has normal function. The left ventricle has no regional wall motion abnormalities. The left ventricular internal cavity size was  normal in size. There is  moderate concentric left ventricular hypertrophy. Left ventricular diastolic parameters are consistent with Grade III diastolic dysfunction (restrictive). Right Ventricle: The right ventricular size is normal. Mildly increased right ventricular wall thickness. Right ventricular systolic function is normal. Left Atrium: Left atrial size was normal in size. Right Atrium: Right atrial size was normal in size. Pericardium: There is no evidence of pericardial effusion. Mitral Valve: The mitral valve is normal in structure. Mild mitral valve regurgitation. Tricuspid Valve: The tricuspid valve is normal in structure. Tricuspid valve regurgitation is mild. Aortic Valve: The aortic valve is calcified. There is moderate calcification of the aortic valve. Aortic valve regurgitation is not visualized. Aortic valve sclerosis/calcification is present, without any evidence of aortic stenosis. Aortic valve mean gradient measures 3.0 mmHg. Aortic valve peak gradient measures 4.8 mmHg. Aortic valve area, by VTI measures 2.31 cm. Pulmonic Valve: The pulmonic valve was normal in structure. Pulmonic valve regurgitation is not visualized. Aorta: The ascending aorta was not well visualized. IAS/Shunts: No atrial level shunt  detected by color flow Doppler.  LEFT VENTRICLE PLAX 2D LVIDd:         3.80 cm LVIDs:         2.40 cm LV PW:         1.30 cm LV IVS:        1.50 cm LVOT diam:     2.00 cm LV SV:         48 LV SV Index:   23 LVOT Area:     3.14 cm  RIGHT VENTRICLE RV Basal diam:  4.30 cm RV Mid diam:    3.30 cm LEFT ATRIUM           Index        RIGHT ATRIUM           Index LA diam:      3.70 cm 1.73 cm/m   RA Area:     22.60 cm LA Vol (A2C): 66.7 ml 31.22 ml/m  RA Volume:   66.70 ml  31.22 ml/m LA Vol (A4C): 50.6 ml 23.69 ml/m  AORTIC VALVE AV Area (Vmax):    2.19 cm AV Area (Vmean):   2.18 cm AV Area (VTI):     2.31 cm AV Vmax:           110.00 cm/s AV Vmean:          77.000 cm/s AV VTI:            0.209 m AV Peak Grad:      4.8 mmHg AV Mean Grad:      3.0 mmHg LVOT Vmax:         76.60 cm/s LVOT Vmean:        53.500 cm/s LVOT VTI:          0.154 m LVOT/AV VTI ratio: 0.74  AORTA Ao Root diam: 3.50 cm MITRAL VALVE               TRICUSPID VALVE MV Area (PHT): 3.64 cm    TR Peak grad:   30.9 mmHg MV Decel Time: 209 msec    TR Vmax:        278.00 cm/s MV E velocity: 99.45 cm/s MV A velocity: 26.70 cm/s  SHUNTS MV E/A ratio:  3.72        Systemic VTI:  0.15 m  Systemic Diam: 2.00 cm Alwyn Pea MD Electronically signed by Alwyn Pea MD Signature Date/Time: 12/18/2022/2:04:44 PM    Final      Family Communication: Wife and son at bedside, updated regarding his care.  Disposition: Status is: Inpatient Remains inpatient appropriate because: Pneumonia, CHF, uncontrolled BP, confusion.  Planned Discharge Destination: Home with Home Health    Time spent: 42 minutes  Author: Marcelino Duster, MD 12/19/2022 4:10 PM  For on call review [email or web address].

## 2022-12-19 NOTE — Plan of Care (Signed)
  Problem: Clinical Measurements: Goal: Signs and symptoms of infection will decrease Outcome: Progressing   Problem: Clinical Measurements: Goal: Will remain free from infection Outcome: Progressing   Problem: Clinical Measurements: Goal: Respiratory complications will improve Outcome: Progressing   Problem: Clinical Measurements: Goal: Cardiovascular complication will be avoided Outcome: Progressing   Problem: Elimination: Goal: Will not experience complications related to bowel motility Outcome: Progressing   Problem: Elimination: Goal: Will not experience complications related to urinary retention Outcome: Progressing   Problem: Safety: Goal: Ability to remain free from injury will improve Outcome: Progressing

## 2022-12-19 NOTE — Progress Notes (Addendum)
CROSS COVER NOTE  NAME: Craig Gilmore MRN: [id number] DOB : [birth year]    Concern as stated by nurse / staff    Pt came in for abdominal pain dx with PNA. Was initially on cardene drip for high BP but cardene now d/c. CTA of the chest demonstrated ascending thoracic aortic aneurysm with no evidence of dissection. history significant of chronic atrial fibrillation on warfarin, COPD, depression, GERD, sick sinus syndrome, gout. Pt was evaluated for HF by cardiology today and was given 1 time dose of 20 mg IV lasix but states unable to pee. Bladder scan showed 736 at 2115. What would you recomeend to do next? thanks     Pertinent findings on chart review: 88 year old with history of a fib, colonic polyps, COPD,depression, GERD, gout, hemorrhoids, hyperlipidemia, hypertension, insomnia, OSA on CPAP, pneumonia, pulmonary hypertension, SSS, squamous cell carcinoma  ( left medial infrapectoral)  and of tip of nose, and CKD 3B admitted with concern for sepsis secondary to pneumonia and hypertensive urgency. He was found to have elevated BNP and cardiology workup initiated today, echo done:EF 65-70% and grade 3 diastolic dysfunction.   Assessment and  Interventions   Assessment:    12/18/2022    9:19 PM 12/18/2022    6:50 PM 12/18/2022    6:22 PM  Vitals with BMI  Systolic 140 138 409  Diastolic 83 83 87  Pulse 79 78 87       Latest Ref Rng & Units 12/18/2022    3:41 AM 12/17/2022    5:06 AM 12/16/2022    7:07 PM  BMP  Glucose 70 - 99 mg/dL 98  97  811   BUN 8 - 23 mg/dL 33  36  38   Creatinine 0.61 - 1.24 mg/dL 9.14  7.82  9.56   Sodium 135 - 145 mmol/L 134  135  135   Potassium 3.5 - 5.1 mmol/L 3.5  3.5  4.1   Chloride 98 - 111 mmol/L 105  103  101   CO2 22 - 32 mmol/L 19  22  22    Calcium 8.9 - 10.3 mg/dL 8.5  8.6  9.2    BNP (last 3 results) Recent Labs    12/18/22 0341  BNP 250.6*   Cardiac Panel (last 3 results) Recent Labs    12/16/22 1907 12/16/22 2100  TROPONINIHS  34* 43*   CBC    Component Value Date/Time   WBC 9.1 12/18/2022 0341   RBC 4.50 12/18/2022 0341   HGB 13.0 12/18/2022 0341   HCT 39.9 12/18/2022 0341   PLT 231 12/18/2022 0341   MCV 88.7 12/18/2022 0341   MCH 28.9 12/18/2022 0341   MCHC 32.6 12/18/2022 0341   RDW 15.4 12/18/2022 0341     CT chest abdomen pelvis on admission 12/16/2022 IMPRESSION: 1. No evidence for aortic dissection or aneurysm. 2. Ascending thoracic aortic aneurysm measuring 4.3 x 3.7 cm. Recommend annual imaging followup by CTA or MRA. This recommendation follows 2010 ACCF/AHA/AATS/ACR/ASA/SCA/SCAI/SIR/STS/SVM Guidelines for the Diagnosis and Management of Patients with Thoracic Aortic Disease. Circulation. 2010; 121: O130-Q657. Aortic aneurysm NOS (ICD10-I71.9) 3. Pathologic mediastinal and right hilar lymphadenopathy of uncertain etiology. 4. Findings compatible with interstitial lung disease. 5. Ground-glass opacity in the right lower lobe measuring 3.3 cm. This may be infectious/inflammatory, but neoplasm can not be excluded. 6. 4.8 cm indeterminate right renal lesion, possibly complex cysts. Recommend further evaluation with dedicated CT or MRI. 7. Bladder diverticulum. 8. Colonic  diverticulosis. 9. Prostatomegaly. 10. Small fat containing left inguinal hernia.   Plan: Acute urinary retention Many risk factors - prostamegaly on CT - no indicator enlargement is infection related (afebrile, normal white count, no c/o pain)- -high risk for cancer given history and CT findings highlighted above; consider oncology consult  - ua with reflex micro -start flomax -refer to urology if appropriate on discharge  -place coude catheter        Donnie Mesa NP [facility] Cross Cover 7pm-7am - check amion for availability Pager [phone]

## 2022-12-19 NOTE — Progress Notes (Signed)
[facility] CLINIC CARDIOLOGY CONSULT NOTE       Patient ID: Craig Gilmore MRN: [id number] DOB/AGE: [birth year] 88 y.o.  Admit date: 12/16/2022 Referring Physician Dr. Clide Dales Primary Physician Dr. Burnett Sheng Primary Cardiologist Dr. Rudi Heap Reason for Consultation ?heart failure  HPI: Craig Gilmore is a 88 y.o. male  with a past medical history of chronic atrial fibrillation on warfarin, hypertension, hyperlipidemia, OSA on CPAP, CKD stage IIIb who presented to the ED on 12/16/2022 for abdominal pain. Cardiology was consulted for further evaluation of possible heart failure.   Interval history: -Patient feeling well this AM, reports SOB, swelling, and abdominal fullness improved.  -Diuresed well overnight. Weight improved. Intermittently placed on 1 L [place].  -BP better controlled this AM.   Review of systems complete and found to be negative unless listed above    Past Medical History:  Diagnosis Date   Chronic atrial fibrillation (HCC)    a.) CHA2DS2VASc = 3 (age x 2, HTN);  b.) s/p DCCV 08/20/2004 --> 50 J x 1 and 100 J x 1 --> coverted with SB. (+) post-procedure nausea; c.) rate/rhythm maintained without pharmacological intervention; chronically anticoagulated with warfarin   Colonic polyp    COPD (chronic obstructive pulmonary disease) (HCC)    Depression    Dyspnea    Edema    GERD (gastroesophageal reflux disease)    Gout    Hemorrhoids    Hyperlipidemia    Hypertension    Insomnia    a.) on hypnotic (zolpidem)   Long term current use of anticoagulant    a.) warfarin   OSA on CPAP    Pneumonia    Pulmonary HTN (HCC) 02/14/2016   a.) TTE 02/14/2016: RVSP 37.5 mmHg   Sick sinus syndrome (HCC)    Squamous cell carcinoma in situ 10/03/2009   L medial infrapectoral   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   12/16/2022 at am   febuxostat (ULORIC) 40 MG tablet Take 40 mg by mouth every evening.   12/15/2022 at 2000   fluticasone (FLONASE) 50 MCG/ACT nasal spray Place 1 spray into both nostrils at bedtime.   12/15/2022 at Unknown   ipratropium (ATROVENT) 0.03 % nasal spray Place 2 sprays into both nostrils every morning.   12/16/2022 at am   omeprazole (PRILOSEC OTC) 20 MG tablet Take 20 mg by mouth daily before breakfast.   12/16/2022 at am   sertraline (ZOLOFT) 100 MG tablet Take 150 mg by mouth every morning.   12/16/2022 at 0800   zolpidem (AMBIEN) 10 MG tablet Take 10 mg by mouth at bedtime.   12/15/2022 at Unknown   Choline Fenofibrate (FENOFIBRIC ACID) 135 MG CPDR Take 1 capsule by mouth  daily (Patient taking differently: Take 1 tablet by mouth every evening. Take 1 capsule by mouth  daily) 90 capsule 3 12/15/2022 at pm   levocetirizine (XYZAL) 5 MG tablet Take 5 mg by mouth every evening.  12/15/2022 at 2000   warfarin (COUMADIN) 2.5 MG tablet Take as directed by anticoagulation clinic (Patient taking differently: Take 2.5 mg by mouth every evening. Take as directed by anticoagulation clinic) 30 tablet 1 12/15/2022 at 1930   Social History   Socioeconomic History   Marital status: Married    Spouse name: Not on file   Number of children: Not on file   Years of education: Not on file   Highest education level: Not on file  Occupational History   Not on file  Tobacco Use   Smoking status: Former    Current packs/day: 0.00    Average packs/day: 1.5 packs/day for 50.0 years (75.0 ttl pk-yrs)    Types: Cigarettes    Start date: 04/09/1939    Quit date: 04/08/1989    Years  since quitting: 33.7   Smokeless tobacco: Never  Vaping Use   Vaping status: Never Used  Substance and Sexual Activity   Alcohol use: No   Drug use: No   Sexual activity: Not on file  Other Topics Concern   Not on file  Social History Narrative   Not on file   Social Determinants of Health   Financial Resource Strain: Low Risk  (05/27/2022)   Received from [facility], [facility]   Overall Financial Resource Strain (CARDIA)    Difficulty of Paying Living Expenses: Not very hard  Food Insecurity: No Food Insecurity (12/17/2022)   Hunger Vital Sign    Worried About Running Out of Food in the Last Year: Never true    Ran Out of Food in the Last Year: Never true  Transportation Needs: No Transportation Needs (12/17/2022)   PRAPARE - Administrator, Civil Service (Medical): No    Lack of Transportation (Non-Medical): No  Physical Activity: Not on file  Stress: Not on file  Social Connections: Not on file  Intimate Partner Violence: Not At Risk (12/17/2022)   Humiliation, Afraid, Rape, and Kick questionnaire    Fear of Current or Ex-Partner: No    Emotionally Abused: No    Physically Abused: No    Sexually Abused: No    Family History  Problem Relation Age of Onset   Diabetes Mother    Heart disease Mother    Heart disease Father    Aneurysm Father      Vitals:   12/19/22 0306 12/19/22 0450 12/19/22 0500 12/19/22 0851  BP: (!) 156/93 (!) 169/94  139/88  Pulse: 85   84  Resp:  (!) 25  (!) 22  Temp: 97.6 F (36.4 C) 97.6 F (36.4 C)  (!) 97.5 F (36.4 C)  TempSrc:  Axillary    SpO2: 95% 98%  94%  Weight:   99.8 kg   Height:        PHYSICAL EXAM General: Well-appearing, well nourished, in no acute distress laying at an incline in hospital bed HEENT: Normocephalic and atraumatic. Neck: No JVD.  Lungs: Normal respiratory effort on 1L [place]. Clear bilaterally to auscultation. No wheezes, crackles, rhonchi.  Heart:  Irregularly irregular, controlled rate. Normal S1 and S2 without gallops or murmurs.  Abdomen: Non-distended appearing.  Msk: Normal strength and tone for age. Extremities: Warm and well perfused. No clubbing, cyanosis.  No edema. Neuro: Alert and oriented X 3. Psych: Answers questions appropriately.   Labs: Basic Metabolic Panel: Recent Labs    12/18/22 0341 12/19/22 0630  NA 134* 133*  K 3.5 3.5  CL 105 99  CO2 19* 19*  GLUCOSE 98 105*  BUN 33* 35*  CREATININE 1.61* 1.79*  CALCIUM 8.5* 9.1   Liver Function Tests: Recent Labs    12/16/22 1907 12/17/22 0506  AST 36 29  ALT 23 18  ALKPHOS 69 53  BILITOT 1.0 0.9  PROT 7.8 6.4*  ALBUMIN 4.0 3.3*   Recent Labs    12/16/22 1907  LIPASE 143*   CBC: Recent Labs    12/18/22 0341 12/19/22 0630  WBC 9.1 14.1*  HGB 13.0 13.8  HCT 39.9 41.0  MCV 88.7 86.5  PLT 231 260   Cardiac Enzymes: Recent Labs    12/16/22 1907 12/16/22 2100  TROPONINIHS 34* 43*   BNP: Recent Labs    12/18/22 0341  BNP 250.6*   D-Dimer: No results for input(s): "DDIMER" in the last 72 hours. Hemoglobin A1C: No results for input(s): "HGBA1C" in the last 72 hours. Fasting Lipid Panel: No results for input(s): "CHOL", "HDL", "LDLCALC", "TRIG", "CHOLHDL", "LDLDIRECT" in the last 72 hours. Thyroid Function Tests: No results for input(s): "TSH", "T4TOTAL", "T3FREE", "THYROIDAB" in the last 72 hours.  Invalid input(s): "FREET3" Anemia Panel: No results for input(s): "VITAMINB12", "FOLATE", "FERRITIN", "TIBC", "IRON", "RETICCTPCT" in the last 72 hours.   Radiology: ECHOCARDIOGRAM COMPLETE  Result Date: 12/18/2022    ECHOCARDIOGRAM REPORT   Patient Name:   OSEI GORSKY Date of Exam: 12/18/2022 Medical Rec #:  [id number]     Height:       67.0 in Accession #:    [id number]    Weight:       227.5 lb Date of Birth:  02/18/1935      BSA:          2.136 m Patient Age:    88 years      BP:           130/81 mmHg Patient Gender: M             HR:            88 bpm. Exam Location:  [facility] Procedure: 2D Echo, Cardiac Doppler and Color Doppler Indications:     Dyspnea R06.00  History:         Patient has no prior history of Echocardiogram examinations.                  Pulmonary HTN; Risk Factors:Hypertension.  Sonographer:     Cristela Blue Referring Phys:  [id number] Marcelino Duster Diagnosing Phys: Alwyn Pea MD IMPRESSIONS  1. Left ventricular ejection fraction, by estimation, is 65 to 70%. The left ventricle has normal function. The left ventricle has no regional wall motion abnormalities. There is moderate concentric left ventricular hypertrophy. Left ventricular diastolic parameters are consistent with Grade III diastolic dysfunction (restrictive).  2. Right ventricular systolic function is normal. The right ventricular size is normal. Mildly increased right ventricular wall thickness.  3. The mitral valve is normal in structure. Mild mitral valve regurgitation.  4. The aortic valve is calcified. There is moderate calcification of the aortic valve. Aortic valve regurgitation is not visualized. Aortic valve sclerosis/calcification is present, without any evidence of aortic stenosis. Conclusion(s)/Recommendation(s): Poor windows for evaluation of left ventricular function by transthoracic echocardiography. Would recommend an alternative means of evaluation. FINDINGS  Left Ventricle: Left ventricular ejection fraction, by estimation, is 65 to 70%. The left ventricle has normal function. The left ventricle has no regional wall motion abnormalities. The left ventricular internal cavity size was normal in size. There is  moderate concentric left ventricular hypertrophy. Left ventricular diastolic parameters are consistent with Grade III diastolic dysfunction (restrictive). Right Ventricle: The right ventricular size is normal. Mildly increased right ventricular wall thickness. Right ventricular systolic function is normal. Left Atrium: Left atrial size was  normal in size. Right Atrium: Right atrial size was normal in size. Pericardium: There is no evidence of pericardial effusion. Mitral Valve: The mitral valve is normal in structure. Mild mitral valve regurgitation. Tricuspid Valve: The tricuspid valve is normal in structure. Tricuspid valve regurgitation is mild. Aortic Valve: The aortic valve is calcified. There is moderate calcification of the aortic valve. Aortic valve regurgitation is not visualized. Aortic valve sclerosis/calcification is present, without any evidence of aortic stenosis. Aortic valve mean gradient measures 3.0 mmHg. Aortic valve peak gradient measures 4.8 mmHg. Aortic valve area, by VTI measures 2.31 cm. Pulmonic Valve: The pulmonic valve was normal in structure. Pulmonic valve regurgitation is not visualized. Aorta: The ascending aorta was not well visualized. IAS/Shunts: No atrial level shunt detected by color flow Doppler.  LEFT VENTRICLE PLAX 2D LVIDd:         3.80 cm LVIDs:         2.40 cm LV PW:         1.30 cm LV IVS:        1.50 cm LVOT diam:     2.00 cm LV SV:         48 LV SV Index:   23 LVOT Area:     3.14 cm  RIGHT VENTRICLE RV Basal diam:  4.30 cm RV Mid diam:    3.30 cm LEFT ATRIUM           Index        RIGHT ATRIUM           Index LA diam:      3.70 cm 1.73 cm/m   RA Area:     22.60 cm LA Vol (A2C): 66.7 ml 31.22 ml/m  RA Volume:   66.70 ml  31.22 ml/m LA Vol (A4C): 50.6 ml 23.69 ml/m  AORTIC VALVE AV Area (Vmax):    2.19 cm AV Area (Vmean):   2.18 cm AV Area (VTI):     2.31 cm AV Vmax:           110.00 cm/s AV Vmean:          77.000 cm/s AV VTI:            0.209 m AV Peak Grad:      4.8 mmHg AV Mean Grad:      3.0 mmHg LVOT Vmax:         76.60 cm/s LVOT Vmean:        53.500 cm/s LVOT VTI:          0.154 m LVOT/AV VTI ratio: 0.74  AORTA Ao Root diam: 3.50 cm MITRAL VALVE               TRICUSPID VALVE MV Area (PHT): 3.64 cm    TR Peak grad:   30.9 mmHg MV Decel Time: 209 msec    TR Vmax:        278.00 cm/s MV E  velocity: 99.45 cm/s MV A velocity: 26.70 cm/s  SHUNTS MV E/A ratio:  3.72        Systemic VTI:  0.15 m  Systemic Diam: 2.00 cm Alwyn Pea MD Electronically signed by Alwyn Pea MD Signature Date/Time: 12/18/2022/2:04:44 PM    Final    CT Angio Chest/Abd/Pel for Dissection W and/or Wo Contrast  Result Date: 12/16/2022 CLINICAL DATA:  Acute aortic syndrome suspected. EXAM: CT ANGIOGRAPHY CHEST, ABDOMEN AND PELVIS TECHNIQUE: Non-contrast CT of the chest was initially obtained. Multidetector CT imaging through the chest, abdomen and pelvis was performed using the standard protocol during bolus administration of intravenous contrast. Multiplanar reconstructed images and MIPs were obtained and reviewed to evaluate the vascular anatomy. RADIATION DOSE REDUCTION: This exam was performed according to the departmental dose-optimization program which includes automated exposure control, adjustment of the mA and/or kV according to patient size and/or use of iterative reconstruction technique. CONTRAST:  75mL OMNIPAQUE IOHEXOL 350 MG/ML SOLN COMPARISON:  CT abdomen and pelvis 12/09/2006 FINDINGS: CTA CHEST FINDINGS Cardiovascular: There is adequate opacification of the thoracic aorta. The ascending aorta is dilated measuring 4.3 x 3.7 cm. There is no evidence for aortic dissection. Descending thoracic aorta is mildly dilated measuring up to 3.1 cm. There is mild calcified atherosclerotic disease throughout the aorta. The heart is mildly enlarged. There are atherosclerotic calcifications of the coronary arteries. There is no pericardial effusion. Mediastinum/Nodes: There are no large and enlarged paratracheal lymph nodes. The largest measures 2.8 by 2.8 cm. There is an enlarged subcarinal lymph node measuring 1.6 cm short axis. Enlarged posterior mediastinal lymph nodes adjacent to the esophagus measure 2.0 x 2.4 by 4.6 cm. Enlarged right hilar lymph nodes measure up to 1.8 cm short  axis. There are numerous nonenlarged prevascular lymph nodes. Visualized esophagus and thyroid gland are within normal limits. Lungs/Pleura: Peripheral reticular opacities are seen throughout both lungs with some associated honeycombing and ground-glass opacities also seen peripherally. Focal ground-glass opacity seen in the right lower lobe measuring 3.3 by 2.0 by 3.3 cm. There is no pleural effusion or pneumothorax. Musculoskeletal: No chest wall abnormality. No acute or significant osseous findings. Review of the MIP images confirms the above findings. CTA ABDOMEN AND PELVIS FINDINGS VASCULAR Aorta: Normal caliber aorta without aneurysm, dissection, vasculitis or significant stenosis. There is calcified atherosclerotic disease throughout the aorta. Celiac: Patent without evidence of aneurysm, dissection, vasculitis or significant stenosis. SMA: Patent without evidence of aneurysm, dissection, vasculitis or significant stenosis. Renals: Both renal arteries are patent without evidence of aneurysm, dissection, vasculitis, fibromuscular dysplasia or significant stenosis. IMA: Patent without evidence of aneurysm, dissection, vasculitis or significant stenosis. Inflow: Patent without evidence of aneurysm, dissection, vasculitis or significant stenosis. Veins: No obvious venous abnormality within the limitations of this arterial phase study. Review of the MIP images confirms the above findings. NON-VASCULAR Hepatobiliary: No focal liver abnormality is seen. Status post cholecystectomy. No biliary dilatation. Pancreas: Unremarkable. No pancreatic ductal dilatation or surrounding inflammatory changes. Spleen: Normal in size without focal abnormality. Adrenals/Urinary Tract: Posterior left bladder diverticulum is present. The bladder is distended, but otherwise within normal limits. There is a rounded low-attenuation area in the inferior pole the right kidney measuring 4.8 cm in diameter this is decreased in size. This  measures 31 Hounsfield units. Stomach/Bowel: Stomach is within normal limits. Appendix appears normal. No evidence of bowel wall thickening, distention, or inflammatory changes. There is sigmoid colon diverticulosis. Lymphatic: No enlarged lymph nodes. Reproductive: Prostate gland is enlarged. Other: There is a small fat containing left inguinal hernia. There is no free fluid. Musculoskeletal: Degenerative changes affect the spine. Review of the MIP images confirms the above findings. IMPRESSION:  1. No evidence for aortic dissection or aneurysm. 2. Ascending thoracic aortic aneurysm measuring 4.3 x 3.7 cm. Recommend annual imaging followup by CTA or MRA. This recommendation follows 2010 ACCF/AHA/AATS/ACR/ASA/SCA/SCAI/SIR/STS/SVM Guidelines for the Diagnosis and Management of Patients with Thoracic Aortic Disease. Circulation. 2010; 121: Z308-M578. Aortic aneurysm NOS (ICD10-I71.9) 3. Pathologic mediastinal and right hilar lymphadenopathy of uncertain etiology. 4. Findings compatible with interstitial lung disease. 5. Ground-glass opacity in the right lower lobe measuring 3.3 cm. This may be infectious/inflammatory, but neoplasm can not be excluded. 6. 4.8 cm indeterminate right renal lesion, possibly complex cysts. Recommend further evaluation with dedicated CT or MRI. 7. Bladder diverticulum. 8. Colonic diverticulosis. 9. Prostatomegaly. 10. Small fat containing left inguinal hernia. Electronically Signed   By: Darliss Cheney M.D.   On: 12/16/2022 23:30   DG Chest 2 View  Result Date: 12/16/2022 CLINICAL DATA:  Chest pain EXAM: CHEST - 2 VIEW COMPARISON:  08/25/2012 FINDINGS: Asymmetric bilateral interstitial pulmonary infiltrates are present, likely infectious or inflammatory in the acute setting. No pneumothorax or pleural effusion. Cardiac size is at the upper limits of normal. Right paratracheal soft tissue opacity likely represents vascular shadow. No acute bone abnormality. IMPRESSION: 1. Asymmetric  bilateral interstitial pulmonary infiltrates, likely infectious or inflammatory in the acute setting. Electronically Signed   By: Helyn Numbers M.D.   On: 12/16/2022 21:31    ECHO pending  TELEMETRY reviewed by me [facility]) 12/19/2022: Atrial fibrillation rate 80s, PVCs  EKG reviewed by me: atrial fibrillation rate 92 bpm  Data reviewed by me [facility]) 12/19/2022: last 24h vitals tele labs imaging I/O ED provider note, admission H&P, hospitalist progress note  Principal Problem:   Sepsis due to pneumonia [facility]) Active Problems:   Chronic atrial fibrillation (HCC)   Hyperlipidemia   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 tophus   Stage 3b chronic kidney disease (HCC)   Sick sinus syndrome (HCC)   Interstitial lung disease (HCC)   Thoracic aortic aneurysm (HCC)    ASSESSMENT AND PLAN:  Craig Gilmore is a 88 y.o. male  with a past medical history of chronic atrial fibrillation on warfarin, hypertension, hyperlipidemia, OSA on CPAP, CKD stage IIIb who presented to the ED on 12/16/2022 for abdominal pain. Cardiology was consulted for further evaluation of possible heart failure.   # ?Heart failure # Hypertensive urgency Patient presenting with shortness of breath since Friday and lower extremity edema of uncertain chronicity.  Some concern for heart failure, BNP checked and was mildly elevated at 250.  Echo this admission with preserved EF, grade 3 diastolic dysfunction. -S/p IV lasix 20 mg late yesterday and again this AM. No plan for further diuresis. -1.7 L UOP yesterday. Catheter placed overnight for urinary retention.  -BP much better controlled today. Continue losartan 50 mg daily, hydralazine 50 mg twice daily. Patient not previously on BP medications prior to admission.   # Right lower lobe pneumonia Patient with chest x-ray and CT both concerning for right lower lobe pneumonia. -Management per primary  # Chronic atrial fibrillation # Bradycardia Patient with a history of chronic atrial  fibrillation and bradycardia while on beta-blockers in the past.  Rate has been controlled on telemetry in the 80s today. -Continue warfarin for stroke risk reduction -Avoid AV nodal blockers given history of bradycardia and relatively controlled rate.  # Chronic kidney disease stage IIIb Baseline creatinine around 1.8, 1.79 on AM labs today -Continue to monitor renal function closely with diuresis.  # Ascending thoracic aortic aneurysm Incidentally noted on  CTA chest, measuring 4.3 x 3.7 cm. -Patient will need annual monitoring of aneurysm with CTA or MRI. -Strict BP control.   This patient's plan of care was discussed and created with Dr. Darrold Junker and he is in agreement.  Signed: Gale Journey, PA-C  12/19/2022, 11:50 AM [facility] Cardiology

## 2022-12-19 NOTE — Progress Notes (Signed)
PT Cancellation Note  Patient Details Name: Craig Gilmore MRN: [id number] DOB: [birth year]   Cancelled Treatment:     Pt reports being "too tired" at this time and Pt's step-son reports "he has been sleeping since 11AM this morning because he did not get enough sleep last night" adding Pt having a panic attack approx 1 hour ago. Will attempt evaluation at a later time.   Ashten Prats 12/19/2022, 3:29 PM

## 2022-12-19 NOTE — Care Management Important Message (Signed)
Important Message  Patient Details  Name: Craig Gilmore MRN: [id number] Date of Birth: [birth year]   Medicare Important Message Given:  Yes     [name] 12/19/2022, 2:20 PM

## 2022-12-20 ENCOUNTER — Other Ambulatory Visit: Payer: Self-pay | Admitting: *Deleted

## 2022-12-20 DIAGNOSIS — I495 Sick sinus syndrome: Secondary | ICD-10-CM | POA: Diagnosis not present

## 2022-12-20 DIAGNOSIS — I160 Hypertensive urgency: Secondary | ICD-10-CM | POA: Diagnosis not present

## 2022-12-20 DIAGNOSIS — E871 Hypo-osmolality and hyponatremia: Secondary | ICD-10-CM | POA: Diagnosis not present

## 2022-12-20 DIAGNOSIS — I482 Chronic atrial fibrillation, unspecified: Secondary | ICD-10-CM | POA: Diagnosis not present

## 2022-12-20 DIAGNOSIS — J849 Interstitial pulmonary disease, unspecified: Secondary | ICD-10-CM | POA: Diagnosis not present

## 2022-12-20 DIAGNOSIS — N1832 Chronic kidney disease, stage 3b: Secondary | ICD-10-CM | POA: Diagnosis not present

## 2022-12-20 DIAGNOSIS — I7121 Aneurysm of the ascending aorta, without rupture: Secondary | ICD-10-CM | POA: Diagnosis not present

## 2022-12-20 DIAGNOSIS — A419 Sepsis, unspecified organism: Secondary | ICD-10-CM

## 2022-12-20 DIAGNOSIS — E782 Mixed hyperlipidemia: Secondary | ICD-10-CM

## 2022-12-20 DIAGNOSIS — I5031 Acute diastolic (congestive) heart failure: Secondary | ICD-10-CM | POA: Diagnosis not present

## 2022-12-20 LAB — BASIC METABOLIC PANEL
Anion gap: 12 (ref 5–15)
BUN: 37 mg/dL — ABNORMAL HIGH (ref 8–23)
CO2: 20 mmol/L — ABNORMAL LOW (ref 22–32)
Calcium: 8.7 mg/dL — ABNORMAL LOW (ref 8.9–10.3)
Chloride: 101 mmol/L (ref 98–111)
Creatinine, Ser: 1.82 mg/dL — ABNORMAL HIGH (ref 0.61–1.24)
GFR, Estimated: 36 mL/min — ABNORMAL LOW (ref >60)
Glucose, Bld: 99 mg/dL (ref 70–99)
Potassium: 3.7 mmol/L (ref 3.5–5.1)
Sodium: 133 mmol/L — ABNORMAL LOW (ref 135–145)

## 2022-12-20 LAB — PROTIME-INR
INR: 3.6 — ABNORMAL HIGH (ref 0.8–1.2)
Prothrombin Time: 36.3 s — ABNORMAL HIGH (ref 11.4–15.2)

## 2022-12-20 LAB — CBC
HCT: 40.2 % (ref 39.0–52.0)
Hemoglobin: 13.2 g/dL (ref 13.0–17.0)
MCH: 29 pg (ref 26.0–34.0)
MCHC: 32.8 g/dL (ref 30.0–36.0)
MCV: 88.4 fL (ref 80.0–100.0)
Platelets: 250 10*3/uL (ref 150–400)
RBC: 4.55 MIL/uL (ref 4.22–5.81)
RDW: 15.5 % (ref 11.5–15.5)
WBC: 11.3 10*3/uL — ABNORMAL HIGH (ref 4.0–10.5)
nRBC: 0 % (ref 0.0–0.2)

## 2022-12-20 MED ORDER — FUROSEMIDE 20 MG PO TABS: 20.0000 mg | ORAL_TABLET | Freq: Every day | ORAL | Status: DC | PRN

## 2022-12-20 MED ORDER — FUROSEMIDE 20 MG PO TABS: 20.0000 mg | ORAL_TABLET | Freq: Every day | ORAL | 0 refills | Status: DC | PRN

## 2022-12-20 MED ORDER — SODIUM CHLORIDE 0.9 % IV BOLUS
500.0000 mL | Freq: Once | INTRAVENOUS | Status: AC
  Administered 2022-12-20: 500 mL via INTRAVENOUS

## 2022-12-20 MED ORDER — TAMSULOSIN HCL 0.4 MG PO CAPS: 0.4000 mg | ORAL_CAPSULE | Freq: Every day | ORAL | 2 refills | Status: DC

## 2022-12-20 MED ORDER — WARFARIN SODIUM 2.5 MG PO TABS: 2.5000 mg | ORAL_TABLET | Freq: Every evening | ORAL | Status: DC

## 2022-12-20 NOTE — Evaluation (Addendum)
Physical Therapy Evaluation Patient Details Name: Craig Gilmore MRN: [id number] DOB: [birth year] Today's Date: 12/20/2022  History of Present Illness  Craig Gilmore is an 8yoM who comes to [facility] on 12/16/22 c ABD pain. Pt admitted with sepsis 2/2 PNA. PMH: AF on warfarin, COPD, depression, GERD, SSS, gout.  Clinical Impression  Pt in be don arrival, O2 donned but doffed for entire session, sats WNL on room air. Pt moving well with bed mobility, labored but successful transfers and in-room AMB with SPC, but he really has to focus to maintain balance. Fatigue takes the win after 2 laps in the room and pt concedes to a break in the recliner. Family at bedside on exit. Will continue to follow. Wife attests to ability to meet pt's physical needs at DC, feels DC to home would be fine.       If plan is discharge home, recommend the following: Help with stairs or ramp for entrance;Assist for transportation   Can travel by private vehicle        Equipment Recommendations None recommended by PT  Recommendations for Other Services       Functional Status Assessment Patient has had a recent decline in their functional status and demonstrates the ability to make significant improvements in function in a reasonable and predictable amount of time.     Precautions / Restrictions Precautions Precautions: Fall Restrictions Weight Bearing Restrictions: No      Mobility  Bed Mobility Overal bed mobility: Modified Independent             General bed mobility comments: moving well, mod effort    Transfers Overall transfer level: Needs assistance Equipment used: Straight cane Transfers: Sit to/from Stand Sit to Stand: Contact guard assist           General transfer comment: difficult, but controlled    Ambulation/Gait Ambulation/Gait assistance: Contact guard assist Gait Distance (Feet): 70 Feet Assistive device: Straight cane         General Gait Details: precarious but  successful, fatigued halfway but presses on for 2nd lap  Stairs            Wheelchair Mobility     Tilt Bed    Modified Rankin (Stroke Patients Only)       Balance Overall balance assessment: Modified Independent                                           Pertinent Vitals/Pain Pain Assessment Pain Assessment: No/denies pain    Home Living Family/patient expects to be discharged to:: Private residence Living Arrangements: Spouse/significant other Available Help at Discharge: Family Type of Home: House Home Access: Stairs to enter Entrance Stairs-Rails: Right Entrance Stairs-Number of Steps: 4-5   Home Layout: One level Home Equipment: Agricultural consultant (2 wheels);Cane - single point      Prior Function                       Extremity/Trunk Assessment                Communication      Cognition Arousal: Alert Behavior During Therapy: WFL for tasks assessed/performed Overall Cognitive Status: Within Functional Limits for tasks assessed  General Comments      Exercises     Assessment/Plan    PT Assessment Patient needs continued PT services  PT Problem List Decreased strength;Decreased activity tolerance;Decreased balance;Decreased mobility       PT Treatment Interventions DME instruction;Gait training;Therapeutic activities;Therapeutic exercise;Balance training;Neuromuscular re-education;Patient/family education    PT Goals (Current goals can be found in the Care Plan section)  Acute Rehab PT Goals Patient Stated Goal: regain mobility PT Goal Formulation: With patient Time For Goal Achievement: 01/03/23 Potential to Achieve Goals: Good    Frequency Min 1X/week     Co-evaluation               AM-PAC PT "6 Clicks" Mobility  Outcome Measure Help needed turning from your back to your side while in a flat bed without using bedrails?: A Little Help  needed moving from lying on your back to sitting on the side of a flat bed without using bedrails?: A Little Help needed moving to and from a bed to a chair (including a wheelchair)?: A Little Help needed standing up from a chair using your arms (e.g., wheelchair or bedside chair)?: A Little Help needed to walk in hospital room?: A Little Help needed climbing 3-5 steps with a railing? : A Lot 6 Click Score: 17    End of Session Equipment Utilized During Treatment: Gait belt;Oxygen Activity Tolerance: Patient tolerated treatment well;No increased pain Patient left: in chair;with call bell/phone within reach;with family/visitor present;with nursing/sitter in room Nurse Communication: Mobility status PT Visit Diagnosis: Unsteadiness on feet (R26.81);Difficulty in walking, not elsewhere classified (R26.2)    Time: 9562-1308 PT Time Calculation (min) (ACUTE ONLY): 26 min   Charges:   PT Evaluation $PT Eval Moderate Complexity: 1 Mod   PT General Charges $$ ACUTE PT VISIT: 1 Visit        12:04 PM, 12/20/22 Rosamaria Lints, PT, DPT Physical Therapist - [facility]  [phone] (ASCOM)    Jonda Alanis C 12/20/2022, 12:01 PM

## 2022-12-20 NOTE — Progress Notes (Cosign Needed Addendum)
3.5 3.7  CL 99 101  CO2 19* 20*  GLUCOSE 105* 99  BUN 35* 37*  CREATININE 1.79* 1.82*  CALCIUM 9.1 8.7*   Liver Function Tests: No results for input(s): "AST", "ALT", "ALKPHOS", "BILITOT", "PROT", "ALBUMIN" in the last 72 hours.  No results for input(s): "LIPASE", "AMYLASE" in the last 72 hours.  CBC: Recent Labs    12/19/22 0630 12/20/22 0528  WBC 14.1* 11.3*  HGB 13.8 13.2  HCT 41.0 40.2  MCV 86.5 88.4  PLT 260 250   Cardiac Enzymes: No results for input(s): "CKTOTAL", "CKMB", "CKMBINDEX", "TROPONINIHS" in the last 72 hours.  BNP: Recent Labs    12/18/22 0341  BNP 250.6*   D-Dimer: No results for input(s): "DDIMER" in the last 72 hours. Hemoglobin A1C: No results for input(s): "HGBA1C" in the last 72 hours. Fasting Lipid Panel: No results for input(s): "CHOL", "HDL", "LDLCALC", "TRIG", "CHOLHDL", "LDLDIRECT" in the last 72 hours. Thyroid Function Tests: No results for input(s): "TSH", "T4TOTAL", "T3FREE", "THYROIDAB" in the last 72 hours.  Invalid input(s): "FREET3" Anemia Panel: No results for input(s): "VITAMINB12", "FOLATE", "FERRITIN", "TIBC", "IRON", "RETICCTPCT" in the last 72 hours.   Radiology: ECHOCARDIOGRAM COMPLETE  Result Date: 12/18/2022    ECHOCARDIOGRAM REPORT   Patient Name:   Craig Gilmore Date of Exam: 12/18/2022 Medical Rec #:  [id number]     Height:       67.0 in Accession #:    [id number]    Weight:       227.5 lb Date of Birth:  Sep 21, 1934      BSA:          2.136 m Patient Age:    88 years       BP:           130/81 mmHg Patient Gender: M             HR:           88 bpm. Exam Location:  [facility] Procedure: 2D Echo, Cardiac Doppler and Color Doppler Indications:     Dyspnea R06.00  History:         Patient has no prior history of Echocardiogram examinations.                  Pulmonary HTN; Risk Factors:Hypertension.  Sonographer:     Cristela Blue Referring Phys:  [id number] Marcelino Duster Diagnosing Phys: Alwyn Pea MD IMPRESSIONS  1. Left ventricular ejection fraction, by estimation, is 65 to 70%. The left ventricle has normal function. The left ventricle has no regional wall motion abnormalities. There is moderate concentric left ventricular hypertrophy. Left ventricular diastolic parameters are consistent with Grade III diastolic dysfunction (restrictive).  2. Right ventricular systolic function is normal. The right ventricular size is normal. Mildly increased right ventricular wall thickness.  3. The mitral valve is normal in structure. Mild mitral valve regurgitation.  4. The aortic valve is calcified. There is moderate calcification of the aortic valve. Aortic valve regurgitation is not visualized. Aortic valve sclerosis/calcification is present, without any evidence of aortic stenosis. Conclusion(s)/Recommendation(s): Poor windows for evaluation of left ventricular function by transthoracic echocardiography. Would recommend an alternative means of evaluation. FINDINGS  Left Ventricle: Left ventricular ejection fraction, by estimation, is 65 to 70%. The left ventricle has normal function. The left ventricle has no regional wall motion abnormalities. The left ventricular internal cavity size was normal in size. There is  moderate concentric left ventricular hypertrophy. Left ventricular diastolic parameters are consistent with  3.5 3.7  CL 99 101  CO2 19* 20*  GLUCOSE 105* 99  BUN 35* 37*  CREATININE 1.79* 1.82*  CALCIUM 9.1 8.7*   Liver Function Tests: No results for input(s): "AST", "ALT", "ALKPHOS", "BILITOT", "PROT", "ALBUMIN" in the last 72 hours.  No results for input(s): "LIPASE", "AMYLASE" in the last 72 hours.  CBC: Recent Labs    12/19/22 0630 12/20/22 0528  WBC 14.1* 11.3*  HGB 13.8 13.2  HCT 41.0 40.2  MCV 86.5 88.4  PLT 260 250   Cardiac Enzymes: No results for input(s): "CKTOTAL", "CKMB", "CKMBINDEX", "TROPONINIHS" in the last 72 hours.  BNP: Recent Labs    12/18/22 0341  BNP 250.6*   D-Dimer: No results for input(s): "DDIMER" in the last 72 hours. Hemoglobin A1C: No results for input(s): "HGBA1C" in the last 72 hours. Fasting Lipid Panel: No results for input(s): "CHOL", "HDL", "LDLCALC", "TRIG", "CHOLHDL", "LDLDIRECT" in the last 72 hours. Thyroid Function Tests: No results for input(s): "TSH", "T4TOTAL", "T3FREE", "THYROIDAB" in the last 72 hours.  Invalid input(s): "FREET3" Anemia Panel: No results for input(s): "VITAMINB12", "FOLATE", "FERRITIN", "TIBC", "IRON", "RETICCTPCT" in the last 72 hours.   Radiology: ECHOCARDIOGRAM COMPLETE  Result Date: 12/18/2022    ECHOCARDIOGRAM REPORT   Patient Name:   Craig Gilmore Date of Exam: 12/18/2022 Medical Rec #:  [id number]     Height:       67.0 in Accession #:    [id number]    Weight:       227.5 lb Date of Birth:  Sep 21, 1934      BSA:          2.136 m Patient Age:    88 years       BP:           130/81 mmHg Patient Gender: M             HR:           88 bpm. Exam Location:  [facility] Procedure: 2D Echo, Cardiac Doppler and Color Doppler Indications:     Dyspnea R06.00  History:         Patient has no prior history of Echocardiogram examinations.                  Pulmonary HTN; Risk Factors:Hypertension.  Sonographer:     Cristela Blue Referring Phys:  [id number] Marcelino Duster Diagnosing Phys: Alwyn Pea MD IMPRESSIONS  1. Left ventricular ejection fraction, by estimation, is 65 to 70%. The left ventricle has normal function. The left ventricle has no regional wall motion abnormalities. There is moderate concentric left ventricular hypertrophy. Left ventricular diastolic parameters are consistent with Grade III diastolic dysfunction (restrictive).  2. Right ventricular systolic function is normal. The right ventricular size is normal. Mildly increased right ventricular wall thickness.  3. The mitral valve is normal in structure. Mild mitral valve regurgitation.  4. The aortic valve is calcified. There is moderate calcification of the aortic valve. Aortic valve regurgitation is not visualized. Aortic valve sclerosis/calcification is present, without any evidence of aortic stenosis. Conclusion(s)/Recommendation(s): Poor windows for evaluation of left ventricular function by transthoracic echocardiography. Would recommend an alternative means of evaluation. FINDINGS  Left Ventricle: Left ventricular ejection fraction, by estimation, is 65 to 70%. The left ventricle has normal function. The left ventricle has no regional wall motion abnormalities. The left ventricular internal cavity size was normal in size. There is  moderate concentric left ventricular hypertrophy. Left ventricular diastolic parameters are consistent with  [facility] CARDIOLOGY CONSULT NOTE       Patient ID: RHYLEE WORTHING MRN: [id number] DOB/AGE: 1934/08/19 88 y.o.  Admit date: 12/16/2022 Referring Physician Dr. Clide Dales Primary Physician Dr. Burnett Sheng Primary Cardiologist Dr. Rudi Heap Reason for Consultation ?heart failure  HPI: Craig Gilmore is a 88 y.o. male  with a past medical history of chronic atrial fibrillation on warfarin, hypertension, hyperlipidemia, OSA on CPAP, CKD stage IIIb who presented to the ED on 12/16/2022 for abdominal pain. Cardiology was consulted for further evaluation of possible heart failure.   Interval history: -Patient feeling well this AM, no complaints at the time of my evaluation. -Appears euvolemic, without LE edema or SOB. Weaned to room air.  -BP well controlled on current meds. Patient without dizziness, lightheadedness.    Review of systems complete and found to be negative unless listed above    Past Medical History:  Diagnosis Date   Chronic atrial fibrillation (HCC)    a.) CHA2DS2VASc = 3 (age x 2, HTN);  b.) s/p DCCV 08/20/2004 --> 50 J x 1 and 100 J x 1 --> coverted with SB. 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   12/16/2022 at am   febuxostat (ULORIC) 40 MG tablet Take 40 mg by mouth every evening.   12/15/2022 at 2000   fluticasone (FLONASE) 50 MCG/ACT nasal spray Place 1 spray into both nostrils at bedtime.   12/15/2022 at Unknown   ipratropium (ATROVENT) 0.03 % nasal spray Place 2 sprays into both nostrils every morning.   12/16/2022 at am   omeprazole (PRILOSEC OTC) 20 MG tablet Take 20 mg by mouth daily before breakfast.   12/16/2022 at am   sertraline (ZOLOFT) 100 MG tablet Take 150 mg by mouth every morning.   12/16/2022 at 0800   zolpidem (AMBIEN) 10 MG tablet Take 10 mg by mouth at bedtime.   12/15/2022 at Unknown   Choline Fenofibrate (FENOFIBRIC ACID) 135 MG CPDR Take 1 capsule by mouth  daily (Patient taking differently: Take 1 tablet by mouth every evening. Take 1 capsule by mouth  daily) 90 capsule 3 12/15/2022 at pm   levocetirizine (XYZAL) 5 MG tablet Take  3.5 3.7  CL 99 101  CO2 19* 20*  GLUCOSE 105* 99  BUN 35* 37*  CREATININE 1.79* 1.82*  CALCIUM 9.1 8.7*   Liver Function Tests: No results for input(s): "AST", "ALT", "ALKPHOS", "BILITOT", "PROT", "ALBUMIN" in the last 72 hours.  No results for input(s): "LIPASE", "AMYLASE" in the last 72 hours.  CBC: Recent Labs    12/19/22 0630 12/20/22 0528  WBC 14.1* 11.3*  HGB 13.8 13.2  HCT 41.0 40.2  MCV 86.5 88.4  PLT 260 250   Cardiac Enzymes: No results for input(s): "CKTOTAL", "CKMB", "CKMBINDEX", "TROPONINIHS" in the last 72 hours.  BNP: Recent Labs    12/18/22 0341  BNP 250.6*   D-Dimer: No results for input(s): "DDIMER" in the last 72 hours. Hemoglobin A1C: No results for input(s): "HGBA1C" in the last 72 hours. Fasting Lipid Panel: No results for input(s): "CHOL", "HDL", "LDLCALC", "TRIG", "CHOLHDL", "LDLDIRECT" in the last 72 hours. Thyroid Function Tests: No results for input(s): "TSH", "T4TOTAL", "T3FREE", "THYROIDAB" in the last 72 hours.  Invalid input(s): "FREET3" Anemia Panel: No results for input(s): "VITAMINB12", "FOLATE", "FERRITIN", "TIBC", "IRON", "RETICCTPCT" in the last 72 hours.   Radiology: ECHOCARDIOGRAM COMPLETE  Result Date: 12/18/2022    ECHOCARDIOGRAM REPORT   Patient Name:   Craig Gilmore Date of Exam: 12/18/2022 Medical Rec #:  [id number]     Height:       67.0 in Accession #:    [id number]    Weight:       227.5 lb Date of Birth:  Sep 21, 1934      BSA:          2.136 m Patient Age:    88 years       BP:           130/81 mmHg Patient Gender: M             HR:           88 bpm. Exam Location:  [facility] Procedure: 2D Echo, Cardiac Doppler and Color Doppler Indications:     Dyspnea R06.00  History:         Patient has no prior history of Echocardiogram examinations.                  Pulmonary HTN; Risk Factors:Hypertension.  Sonographer:     Cristela Blue Referring Phys:  [id number] Marcelino Duster Diagnosing Phys: Alwyn Pea MD IMPRESSIONS  1. Left ventricular ejection fraction, by estimation, is 65 to 70%. The left ventricle has normal function. The left ventricle has no regional wall motion abnormalities. There is moderate concentric left ventricular hypertrophy. Left ventricular diastolic parameters are consistent with Grade III diastolic dysfunction (restrictive).  2. Right ventricular systolic function is normal. The right ventricular size is normal. Mildly increased right ventricular wall thickness.  3. The mitral valve is normal in structure. Mild mitral valve regurgitation.  4. The aortic valve is calcified. There is moderate calcification of the aortic valve. Aortic valve regurgitation is not visualized. Aortic valve sclerosis/calcification is present, without any evidence of aortic stenosis. Conclusion(s)/Recommendation(s): Poor windows for evaluation of left ventricular function by transthoracic echocardiography. Would recommend an alternative means of evaluation. FINDINGS  Left Ventricle: Left ventricular ejection fraction, by estimation, is 65 to 70%. The left ventricle has normal function. The left ventricle has no regional wall motion abnormalities. The left ventricular internal cavity size was normal in size. There is  moderate concentric left ventricular hypertrophy. Left ventricular diastolic parameters are consistent with  [facility] CARDIOLOGY CONSULT NOTE       Patient ID: RHYLEE WORTHING MRN: [id number] DOB/AGE: 1934/08/19 88 y.o.  Admit date: 12/16/2022 Referring Physician Dr. Clide Dales Primary Physician Dr. Burnett Sheng Primary Cardiologist Dr. Rudi Heap Reason for Consultation ?heart failure  HPI: Craig Gilmore is a 88 y.o. male  with a past medical history of chronic atrial fibrillation on warfarin, hypertension, hyperlipidemia, OSA on CPAP, CKD stage IIIb who presented to the ED on 12/16/2022 for abdominal pain. Cardiology was consulted for further evaluation of possible heart failure.   Interval history: -Patient feeling well this AM, no complaints at the time of my evaluation. -Appears euvolemic, without LE edema or SOB. Weaned to room air.  -BP well controlled on current meds. Patient without dizziness, lightheadedness.    Review of systems complete and found to be negative unless listed above    Past Medical History:  Diagnosis Date   Chronic atrial fibrillation (HCC)    a.) CHA2DS2VASc = 3 (age x 2, HTN);  b.) s/p DCCV 08/20/2004 --> 50 J x 1 and 100 J x 1 --> coverted with SB. 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   12/16/2022 at am   febuxostat (ULORIC) 40 MG tablet Take 40 mg by mouth every evening.   12/15/2022 at 2000   fluticasone (FLONASE) 50 MCG/ACT nasal spray Place 1 spray into both nostrils at bedtime.   12/15/2022 at Unknown   ipratropium (ATROVENT) 0.03 % nasal spray Place 2 sprays into both nostrils every morning.   12/16/2022 at am   omeprazole (PRILOSEC OTC) 20 MG tablet Take 20 mg by mouth daily before breakfast.   12/16/2022 at am   sertraline (ZOLOFT) 100 MG tablet Take 150 mg by mouth every morning.   12/16/2022 at 0800   zolpidem (AMBIEN) 10 MG tablet Take 10 mg by mouth at bedtime.   12/15/2022 at Unknown   Choline Fenofibrate (FENOFIBRIC ACID) 135 MG CPDR Take 1 capsule by mouth  daily (Patient taking differently: Take 1 tablet by mouth every evening. Take 1 capsule by mouth  daily) 90 capsule 3 12/15/2022 at pm   levocetirizine (XYZAL) 5 MG tablet Take  [facility] CARDIOLOGY CONSULT NOTE       Patient ID: RHYLEE WORTHING MRN: [id number] DOB/AGE: 1934/08/19 88 y.o.  Admit date: 12/16/2022 Referring Physician Dr. Clide Dales Primary Physician Dr. Burnett Sheng Primary Cardiologist Dr. Rudi Heap Reason for Consultation ?heart failure  HPI: Craig Gilmore is a 88 y.o. male  with a past medical history of chronic atrial fibrillation on warfarin, hypertension, hyperlipidemia, OSA on CPAP, CKD stage IIIb who presented to the ED on 12/16/2022 for abdominal pain. Cardiology was consulted for further evaluation of possible heart failure.   Interval history: -Patient feeling well this AM, no complaints at the time of my evaluation. -Appears euvolemic, without LE edema or SOB. Weaned to room air.  -BP well controlled on current meds. Patient without dizziness, lightheadedness.    Review of systems complete and found to be negative unless listed above    Past Medical History:  Diagnosis Date   Chronic atrial fibrillation (HCC)    a.) CHA2DS2VASc = 3 (age x 2, HTN);  b.) s/p DCCV 08/20/2004 --> 50 J x 1 and 100 J x 1 --> coverted with SB. 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   12/16/2022 at am   febuxostat (ULORIC) 40 MG tablet Take 40 mg by mouth every evening.   12/15/2022 at 2000   fluticasone (FLONASE) 50 MCG/ACT nasal spray Place 1 spray into both nostrils at bedtime.   12/15/2022 at Unknown   ipratropium (ATROVENT) 0.03 % nasal spray Place 2 sprays into both nostrils every morning.   12/16/2022 at am   omeprazole (PRILOSEC OTC) 20 MG tablet Take 20 mg by mouth daily before breakfast.   12/16/2022 at am   sertraline (ZOLOFT) 100 MG tablet Take 150 mg by mouth every morning.   12/16/2022 at 0800   zolpidem (AMBIEN) 10 MG tablet Take 10 mg by mouth at bedtime.   12/15/2022 at Unknown   Choline Fenofibrate (FENOFIBRIC ACID) 135 MG CPDR Take 1 capsule by mouth  daily (Patient taking differently: Take 1 tablet by mouth every evening. Take 1 capsule by mouth  daily) 90 capsule 3 12/15/2022 at pm   levocetirizine (XYZAL) 5 MG tablet Take  3.5 3.7  CL 99 101  CO2 19* 20*  GLUCOSE 105* 99  BUN 35* 37*  CREATININE 1.79* 1.82*  CALCIUM 9.1 8.7*   Liver Function Tests: No results for input(s): "AST", "ALT", "ALKPHOS", "BILITOT", "PROT", "ALBUMIN" in the last 72 hours.  No results for input(s): "LIPASE", "AMYLASE" in the last 72 hours.  CBC: Recent Labs    12/19/22 0630 12/20/22 0528  WBC 14.1* 11.3*  HGB 13.8 13.2  HCT 41.0 40.2  MCV 86.5 88.4  PLT 260 250   Cardiac Enzymes: No results for input(s): "CKTOTAL", "CKMB", "CKMBINDEX", "TROPONINIHS" in the last 72 hours.  BNP: Recent Labs    12/18/22 0341  BNP 250.6*   D-Dimer: No results for input(s): "DDIMER" in the last 72 hours. Hemoglobin A1C: No results for input(s): "HGBA1C" in the last 72 hours. Fasting Lipid Panel: No results for input(s): "CHOL", "HDL", "LDLCALC", "TRIG", "CHOLHDL", "LDLDIRECT" in the last 72 hours. Thyroid Function Tests: No results for input(s): "TSH", "T4TOTAL", "T3FREE", "THYROIDAB" in the last 72 hours.  Invalid input(s): "FREET3" Anemia Panel: No results for input(s): "VITAMINB12", "FOLATE", "FERRITIN", "TIBC", "IRON", "RETICCTPCT" in the last 72 hours.   Radiology: ECHOCARDIOGRAM COMPLETE  Result Date: 12/18/2022    ECHOCARDIOGRAM REPORT   Patient Name:   Craig Gilmore Date of Exam: 12/18/2022 Medical Rec #:  [id number]     Height:       67.0 in Accession #:    [id number]    Weight:       227.5 lb Date of Birth:  Sep 21, 1934      BSA:          2.136 m Patient Age:    88 years       BP:           130/81 mmHg Patient Gender: M             HR:           88 bpm. Exam Location:  [facility] Procedure: 2D Echo, Cardiac Doppler and Color Doppler Indications:     Dyspnea R06.00  History:         Patient has no prior history of Echocardiogram examinations.                  Pulmonary HTN; Risk Factors:Hypertension.  Sonographer:     Cristela Blue Referring Phys:  [id number] Marcelino Duster Diagnosing Phys: Alwyn Pea MD IMPRESSIONS  1. Left ventricular ejection fraction, by estimation, is 65 to 70%. The left ventricle has normal function. The left ventricle has no regional wall motion abnormalities. There is moderate concentric left ventricular hypertrophy. Left ventricular diastolic parameters are consistent with Grade III diastolic dysfunction (restrictive).  2. Right ventricular systolic function is normal. The right ventricular size is normal. Mildly increased right ventricular wall thickness.  3. The mitral valve is normal in structure. Mild mitral valve regurgitation.  4. The aortic valve is calcified. There is moderate calcification of the aortic valve. Aortic valve regurgitation is not visualized. Aortic valve sclerosis/calcification is present, without any evidence of aortic stenosis. Conclusion(s)/Recommendation(s): Poor windows for evaluation of left ventricular function by transthoracic echocardiography. Would recommend an alternative means of evaluation. FINDINGS  Left Ventricle: Left ventricular ejection fraction, by estimation, is 65 to 70%. The left ventricle has normal function. The left ventricle has no regional wall motion abnormalities. The left ventricular internal cavity size was normal in size. There is  moderate concentric left ventricular hypertrophy. Left ventricular diastolic parameters are consistent with

## 2022-12-20 NOTE — Evaluation (Signed)
Occupational Therapy Evaluation Patient Details Name: Craig Gilmore MRN: [id number] DOB: 1935/01/24 Today's Date: 12/20/2022   History of Present Illness Dodger Shy is an 59yoM who comes to [facility] on 12/16/22 c ABD pain. Pt admitted with sepsis 2/2 PNA. PMH: AF on warfarin, COPD, depression, GERD, SSS, gout.   Clinical Impression   Pt seen for OT evaluation this date. Pt was generally independent in all ADL (PRN assist from spouse) and functional mobility using SPC, living in a 1 story home with 4-5STE and R rail. Pt currently requires PRN assist for LB bathing and dressing and SBA-CGA for ADL transfers and mobility using SPC due to current functional impairments including decreased strength, activity tolerance, and balance (See OT Problem List below). Pt/family educated in falls prevention, AE/DME, energy conservation strategies, and home/routines modifications to maximize safety/indep. PT/family verbalized understanding. Pt would benefit from additional skilled OT services to maximize recall and carryover of learned techniques and facilitate implementation of learned techniques into daily routines.     If plan is discharge home, recommend the following: A little help with walking and/or transfers;A little help with bathing/dressing/bathroom;Assistance with cooking/housework;Assist for transportation;Help with stairs or ramp for entrance    Functional Status Assessment  Patient has had a recent decline in their functional status and demonstrates the ability to make significant improvements in function in a reasonable and predictable amount of time.  Equipment Recommendations  Other (comment) (handheld shower head, LH sponge, LH shoe horn)    Recommendations for Other Services       Precautions / Restrictions Precautions Precautions: Fall Restrictions Weight Bearing Restrictions: No      Mobility Bed Mobility                    Transfers Overall transfer level: Needs  assistance Equipment used: Straight cane Transfers: Sit to/from Stand Sit to Stand: Supervision                  Balance Overall balance assessment: Mild deficits observed, not formally tested                                         ADL either performed or assessed with clinical judgement   ADL                                         General ADL Comments: Pt able to complete LB dressing utilizing figure four method. Increased effort noted but pt/family encouraged to continue to utilize figure four technique to optimize safety and independence. SBA-CGA for ADL transfers and mobility wiht SPC.     Vision         Perception         Praxis         Pertinent Vitals/Pain Pain Assessment Pain Assessment: No/denies pain     Extremity/Trunk Assessment Upper Extremity Assessment Upper Extremity Assessment: Overall WFL for tasks assessed   Lower Extremity Assessment Lower Extremity Assessment: Defer to PT evaluation       Communication     Cognition Arousal: Alert Behavior During Therapy: [facility] for tasks assessed/performed Overall Cognitive Status: Within Functional Limits for tasks assessed  General Comments  sitting BP 137/85 HR 87, standing 116/78 HR 113, standing 137/60 HR 104. Pt noting mild whooziness that did not change once in standing    Exercises Other Exercises Other Exercises: Pt/family educated in falls prevention, AE/DME, energy conservation strategies, and home/routines modifications to maximize safety/indep   Shoulder Instructions      Home Living Family/patient expects to be discharged to:: Private residence Living Arrangements: Spouse/significant other Available Help at Discharge: Family Type of Home: House Home Access: Stairs to enter Secretary/administrator of Steps: 4-5 Entrance Stairs-Rails: Right Home Layout: One level     Bathroom  Shower/Tub: Walk-in shower         Home Equipment: Agricultural consultant (2 wheels);Cane - single point;Shower seat          Prior Functioning/Environment                 ADLs Comments: PRN assist for socks/shoes        OT Problem List: Decreased strength;Decreased activity tolerance;Impaired balance (sitting and/or standing);Decreased knowledge of use of DME or AE      OT Treatment/Interventions: Self-care/ADL training;Therapeutic exercise;Therapeutic activities;DME and/or AE instruction;Energy conservation;Patient/family education;Balance training    OT Goals(Current goals can be found in the care plan section) Acute Rehab OT Goals Patient Stated Goal: go home OT Goal Formulation: With patient/family Time For Goal Achievement: 01/03/23 Potential to Achieve Goals: Good ADL Goals Additional ADL Goal #1: Pt will verbalize plan to implement at least 1 learned ECS into daily ADL routine to improve safety and indep while minimizing risk of over exertion and falls. Additional ADL Goal #2: Pt will complete all aspects of dressing with modified independence, incorporating learned ECS, 2/2 opportunities.  OT Frequency: Min 1X/week    Co-evaluation              AM-PAC OT "6 Clicks" Daily Activity     Outcome Measure Help from another person eating meals?: None Help from another person taking care of personal grooming?: None Help from another person toileting, which includes using toliet, bedpan, or urinal?: None Help from another person bathing (including washing, rinsing, drying)?: A Little Help from another person to put on and taking off regular upper body clothing?: None Help from another person to put on and taking off regular lower body clothing?: None 6 Click Score: 23   End of Session Equipment Utilized During Treatment: Other (comment) [facility]) Nurse Communication: Mobility status;Other (comment) (BPs)  Activity Tolerance: Patient tolerated treatment well Patient  left: in chair;with call bell/phone within reach;with family/visitor present;with nursing/sitter in room  OT Visit Diagnosis: Other abnormalities of gait and mobility (R26.89);Muscle weakness (generalized) (M62.81);History of falling (Z91.81)                Time: 1455-1531 OT Time Calculation (min): 36 min Charges:  OT General Charges $OT Visit: 1 Visit OT Evaluation $OT Eval Low Complexity: 1 Low OT Treatments $Self Care/Home Management : 8-22 mins  Arman Filter., MPH, MS, OTR/L ascom [phone] 12/20/22, 3:41 PM

## 2022-12-20 NOTE — Progress Notes (Signed)
ANTICOAGULATION CONSULT NOTE  Pharmacy Consult for Warfarin  Indication: atrial fibrillation  Allergies  Allergen Reactions   Baycol [Cerivastatin]    Pravastatin     Patient Measurements: Height: 5\' 7"  (170.2 cm) Weight: 99.8 kg (220 lb 0.3 oz) IBW/kg (Calculated) : 66.1  Vital Signs: Temp: 97.5 F (36.4 C) (09/13 0639) Temp Source: Axillary (09/13 0002) BP: 113/72 (09/13 0639) Pulse Rate: 92 (09/13 0639)  Labs: Recent Labs    12/18/22 0341 12/19/22 0630 12/20/22 0528  HGB 13.0 13.8 13.2  HCT 39.9 41.0 40.2  PLT 231 260 250  LABPROT 34.7* 34.8* 36.3*  INR 3.4* 3.4* 3.6*  CREATININE 1.61* 1.79* 1.82*    Estimated Creatinine Clearance: 32.8 mL/min (A) (by C-G formula based on SCr of 1.82 mg/dL (H)).   Medical History: Past Medical History:  Diagnosis Date   Chronic atrial fibrillation (HCC)    a.) CHA2DS2VASc = 3 (age x 2, HTN);  b.) s/p DCCV 08/20/2004 --> 50 J x 1 and 100 J x 1 --> coverted with SB. (+) post-procedure nausea; c.) rate/rhythm maintained without pharmacological intervention; chronically anticoagulated with warfarin   Colonic polyp    COPD (chronic obstructive pulmonary disease) (HCC)    Depression    Dyspnea    Edema    GERD (gastroesophageal reflux disease)    Gout    Hemorrhoids    Hyperlipidemia    Hypertension    Insomnia    a.) on hypnotic (zolpidem)   Long term current use of anticoagulant    a.) warfarin   OSA on CPAP    Pneumonia    Pulmonary HTN (HCC) 02/14/2016   a.) TTE 02/14/2016: RVSP 37.5 mmHg   Sick sinus syndrome (HCC)    Squamous cell carcinoma in situ 10/03/2009   L medial infrapectoral   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   12/16/2022 at am   febuxostat (ULORIC) 40 MG  tablet Take 40 mg by mouth every evening.   12/15/2022 at 2000   fluticasone (FLONASE) 50 MCG/ACT nasal spray Place 1 spray into both nostrils at bedtime.   12/15/2022 at Unknown   ipratropium (ATROVENT) 0.03 % nasal spray Place 2 sprays into both nostrils every morning.   12/16/2022 at am   omeprazole (PRILOSEC OTC) 20 MG tablet Take 20 mg by mouth daily before breakfast.   12/16/2022 at am   sertraline (ZOLOFT) 100 MG tablet Take 150 mg by mouth every morning.   12/16/2022 at 0800   zolpidem (AMBIEN) 10 MG tablet Take 10 mg by mouth at bedtime.   12/15/2022 at Unknown   Choline Fenofibrate (FENOFIBRIC ACID) 135 MG CPDR Take 1 capsule by mouth  daily (Patient taking differently: Take 1 tablet by mouth every evening. Take 1 capsule by mouth  daily) 90 capsule 3 12/15/2022 at pm   levocetirizine (XYZAL) 5 MG tablet Take 5 mg by mouth every evening.   12/15/2022 at 2000   warfarin (COUMADIN) 2.5 MG tablet Take as directed by anticoagulation clinic (Patient taking differently: Take 2.5 mg by mouth every evening. Take as directed by anticoagulation clinic) 30 tablet 1 12/15/2022 at 1930    Assessment: Craig Gilmore is a 88 y.o. male presenting with abdominal pain. PMH  significant for AF on warfarin, COPD, depression, GERD, sick sinus syndrome, gout. Patient was on Warfarin PTA per chart review. Last dose of Warfarin PTA was on 9/8 at 1930 (2.5 mg). Of note, INR was 2.6 (therapeutic) on home regimen on arrival. Pharmacy has been consulted to initiate and manage Warfarin.    DDI: azithromycin (may increase INR), fenofibrate (may increase INR)  Baseline Labs: PT 27.7, INR 2.6, Hgb 13.7, Hct 42.5, Plt 255    Goal of Therapy:  INR 2-3  Date    INR      Preceding Warfarin Dose    9/9 2.6 2.5 mg 9/10 2.8 2.5 mg 9/11 3.4 2 mg 9/12 3.4 Hold  9/13 3.6 Hold    Plan:  Hold warfarin again today given supratherapeutic INR Check INR daily until stable Check CBC at least weekly while on warfarin   Celene Squibb,  PharmD Clinical Pharmacist 12/20/2022 7:28 AM

## 2022-12-20 NOTE — TOC Progression Note (Signed)
Transition of Care [facility]) - Progression Note    Patient Details  Name: Craig Gilmore MRN: [id number] Date of Birth: Apr 10, 1934  Transition of [facility]) CM/SW Contact  Truddie Hidden, RN Phone Number: 12/20/2022, 12:06 PM  Clinical Narrative:    TOC continuing to follow patient's progress throughout discharge planning.        Expected Discharge Plan and Services                                               Social Determinants of Health (SDOH) Interventions SDOH Screenings   Food Insecurity: No Food Insecurity (12/17/2022)  Housing: Low Risk  (12/17/2022)  Transportation Needs: No Transportation Needs (12/17/2022)  Utilities: Not At Risk (12/17/2022)  Financial Resource Strain: Low Risk  (05/27/2022)   Received from [facility], [facility]  Tobacco Use: Medium Risk (12/17/2022)    Readmission Risk Interventions     No data to display

## 2022-12-20 NOTE — TOC Transition Note (Signed)
Transition of Care [facility]) - CM/SW Discharge Note   Patient Details  Name: Craig Gilmore MRN: [id number] Date of Birth: October 26, 1934  Transition of Care [facility]) CM/SW Contact:  Truddie Hidden, RN Phone Number: 12/20/2022, 3:45 PM   Clinical Narrative:    Spoke with patient's wife regarding HH PT/ OT. She is agreeable to [facility] and do not have a preference. She was advised the accepting agency would contact her to schedule a SOC.   Referral sent to Coralee North from [place]. SOC will be in 5 days due to needed auth  TOC signing off.         Patient Goals and CMS Choice      Discharge Placement                         Discharge Plan and Services Additional resources added to the After Visit Summary for                                       Social Determinants of Health (SDOH) Interventions SDOH Screenings   Food Insecurity: No Food Insecurity (12/17/2022)  Housing: Low Risk  (12/17/2022)  Transportation Needs: No Transportation Needs (12/17/2022)  Utilities: Not At Risk (12/17/2022)  Financial Resource Strain: Low Risk  (05/27/2022)   Received from [facility], [facility]  Tobacco Use: Medium Risk (12/17/2022)     Readmission Risk Interventions     No data to display

## 2022-12-20 NOTE — Consult Note (Signed)
[facility] Customer service manager [facility]) Accountable Care Organization (ACO) [facility] Liaison Note  12/20/2022  Craig Gilmore 01/17/1935 [id number]  Location: [facility] RN Hospital Liaison screened the patient remotely at [facility].  Insurance: [facility]   Craig Gilmore is a 88 y.o. male who is a Primary Care Patient of Jerl Mina, MD-[facility].  The patient was screened for readmission hospitalization with noted medium risk score for unplanned readmission risk with 1 IP in 6 months.  The patient was assessed for potential [facility]) Care Management service needs for post hospital transition for care coordination. Review of patient's electronic medical record reveals patient was admitted with Sepsis that lead to pneumonia. Will make a referral for community care coordination services due to this diagnosis. No discharge interventions or needs presented via [facility] team at this time. Pt will discharge today.  Plan: [facility] Liaison will continue to follow progress and disposition to asess for post hospital community care coordination/management needs.  Referral request for community care coordination: pending disposition.   The [facility] Care Management/Population Health does not replace or interfere with any arrangements made by the Inpatient Transition of Care team.   For questions contact:   Elliot Cousin, RN, [facility] Liaison [place]   Population Health Office Hours MTWF  8:00 am-6:00 pm [phone] mobile [phone] [Office toll free line] Office Hours are M-F 8:30 - 5 pm [email or web address]

## 2022-12-20 NOTE — Discharge Summary (Addendum)
Physician Discharge Summary   Patient: Craig Gilmore MRN: [id number] DOB: Sep 25, 1934  Admit date:     12/16/2022  Discharge date: 12/20/22  Discharge Physician: Marcelino Duster   PCP: Jerl Mina, MD   Recommendations at discharge:   PCP follow-up in 1 week. Patient will follow-up with cardiology as scheduled. Urology follow-up suggested for prostatomegaly, urinary retention, right renal lesion on CT scan Ascending thoracic aortic aneurysm 4.3 x 3.7 cm will need annual follow-up. Suggested pulmonary evaluation for groundglass opacity right lower lobe with mediastinal and hilar lymphadenopathy.  Discharge Diagnoses: Principal Problem:   Sepsis due to pneumonia [facility]) Active Problems:   Chronic atrial fibrillation (HCC)   Hyperlipidemia   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 tophus   Stage 3b chronic kidney disease (HCC)   Sick sinus syndrome (HCC)   Interstitial lung disease (HCC)   Thoracic aortic aneurysm (HCC)   Hyponatremia  Resolved Problems:   * No resolved hospital problems. *  Hospital Course: [name] is a 88 y.o. male with medical history significant of chronic atrial fibrillation on warfarin, COPD, depression, GERD, sick sinus syndrome, gout, who presented the ER today with abdominal pain.  In ED he was noted to have high blood pressure. CTA chest/ abdomen/ pelvis showed Ascending thoracic aortic aneurysm measuring 4.3 x 3.7 cm, pathologic mediastinal and right hilar lymphadenopathy of uncertain etiology, interstitial lung disease, ground-glass opacity in the right lower lobe.    Patient is admitted to the hospitalist service with impression of sepsis secondary to pneumonia, hypertensive urgency.  Patient is started on Rocephin, azithromycin therapy.  Hydralazine IV and oral medications for high blood pressures, later losartan added to the regimen.  During hospital stay cardiology evaluated the patient recommended IV Lasix 20 mg x 2 and he did diurese well.   Due to urinary retention, a Foley catheter is placed.  Patient was confused and restless, all opiates, benzos held.  Today patient is more alert, awake, Foley catheter removed.  He is able to void but has difficulty.  Patient's blood pressure dropped and he was dizzy and so antihypertensives stopped.  Patient is anxious to go home and family is aware of PCP, cardiology, urology and pulmonology follow-up upon discharge.  Patient is hemodynamically stable to be discharged home.  Patient and family understand agree with the discharge plan.   Assessment and Plan: Right lower lobe pneumonia- Sepsis has been ruled out. Discussed with family about lymphadenopathy, possible malignancy which need outpatient follow up with pulmonology. Finished 5 days of Rocephin and azithromycin therapy.   Positive blood cultures: 1/4 bottles MRSE likely contamination. He remains afebrile, stable off antibiotics.   Epigastric abdominal discomfort- Possibly from right lower lobe pneumonia vs constipation, urinary retention. He will need outpatient follow-up for ascending thoracic aortic aneurysm. Urology follow-up for BPH, urinary retention, renal lesion advised.   Acute metabolic encephalopathy. His confusion, restlessness much improved today Avoid benzos, opiates if possible. Continue delirium precautions, supportive care.   Hypertensive urgency- improved IV hydralazine as needed ordered for SBP greater than 160. Due to low blood pressures I am going to discontinue hydralazine, losartan therapy at this time.  Advised to follow-up with PCP, cardiology for further recommendations.   Acute diastolic heart failure: Given bilateral interstitial lung disease, pedal edema, orthopnea an echocardiogram was obtained which showed grade 3 diastolic dysfunction. Elevated BNP noted.  Patient got 2 doses of IV Lasix with good diuresis.  Shortness of breath, leg swelling improved.  He is off oxygen. Cardiology  team advised  Lasix 20 mg oral as needed.   Chronic atrial fibrillation on Coumadin therapy- Patient's heart rate stable. Continue Coumadin therapy from Monday as INR 3.6 persistently likley due to Azithromycin. INR checks as outpatient.   Hyponatremia- Hypervolemic. Sodium stable around 131.   Chronic kidney disease stage IIIb- Creatinine stable. Baseline creatinine from Care everywhere 1.8 GFR 36. Avoid nephrotoxic drugs. Monitor renal function as outpatient.   Sick sinus syndrome- Patient does have bradycardia noted on the telemetry with lowest heart rate 36. Avoid beta-blocker therapy.   Urinary retention-  Prostatomegaly on CT noted. PSA normal. Foley removed. Noted difficulty voiding. Started flomax. Urology follow up outpatient advised.   Obesity with BMI 35.63 Diet, exercise and weight reduction counseling.          Consultants: Cardiology Procedures performed: none  Disposition: Home Diet recommendation:  Discharge Diet Orders (From admission, onward)     Start     Ordered   12/20/22 0000  Diet - low sodium heart healthy        12/20/22 1500           Cardiac diet DISCHARGE MEDICATION: Allergies as of 12/20/2022       Reactions   Baycol [cerivastatin]    Pravastatin         Medication List     TAKE these medications    buPROPion 150 MG 24 hr tablet Commonly known as: WELLBUTRIN XL Take 75 mg by mouth every morning.   febuxostat 40 MG tablet Commonly known as: ULORIC Take 40 mg by mouth every evening.   Fenofibric Acid 135 MG Cpdr Take 1 capsule by mouth  daily What changed:  how much to take how to take this when to take this   fluticasone 50 MCG/ACT nasal spray Commonly known as: FLONASE Place 1 spray into both nostrils at bedtime.   furosemide 20 MG tablet Commonly known as: LASIX Take 1 tablet (20 mg total) by mouth daily as needed for fluid or edema (Take 1 tablet for worsening leg swelling, shortness of breath, or weight gain of  greater than 3 lbs.). Start taking on: December 21, 2022   ipratropium 0.03 % nasal spray Commonly known as: ATROVENT Place 2 sprays into both nostrils every morning.   levocetirizine 5 MG tablet Commonly known as: XYZAL Take 5 mg by mouth every evening.   omeprazole 20 MG tablet Commonly known as: PRILOSEC OTC Take 20 mg by mouth daily before breakfast.   sertraline 100 MG tablet Commonly known as: ZOLOFT Take 150 mg by mouth every morning.   tamsulosin 0.4 MG Caps capsule Commonly known as: Flomax Take 1 capsule (0.4 mg total) by mouth daily after supper.   warfarin 2.5 MG tablet Commonly known as: COUMADIN Take 1 tablet (2.5 mg total) by mouth every evening. Take as directed by anticoagulation clinic Start taking on: December 23, 2022 What changed:  how much to take how to take this when to take this These instructions start on December 23, 2022. If you are unsure what to do until then, ask your doctor or other care provider.   zolpidem 10 MG tablet Commonly known as: AMBIEN Take 10 mg by mouth at bedtime.        Follow-up Information     Paraschos, Alexander, MD. Go in 1 week(s).   Specialty: Cardiology Contact information: [address] [facility] [place] [address] [phone]  Discharge Exam: Ceasar Mons Weights   12/16/22 1906 12/19/22 0500  Weight: 103.2 kg 99.8 kg   General - Elderly Caucasian male, sitting in no distress HEENT - PERRLA, EOMI, atraumatic head, non tender sinuses. Lung - Clear, bibasal rales, rhonchi noted. Heart - S1, S2 heard, no murmurs, rubs, trace pedal edema. Abdomen - soft, non tender bowel sounds good. Neuro - Alert, awake and oriented, non focal exam. Skin - Warm and dry.  Condition at discharge: stable  The results of significant diagnostics from this hospitalization (including imaging, microbiology, ancillary and laboratory) are listed below for reference.    Imaging Studies: ECHOCARDIOGRAM COMPLETE  Result Date: 12/18/2022    ECHOCARDIOGRAM REPORT   Patient Name:   MILLIARD BORES Date of Exam: 12/18/2022 Medical Rec #:  [id number]     Height:       67.0 in Accession #:    [id number]    Weight:       227.5 lb Date of Birth:  [birth year]      BSA:          2.136 m Patient Age:    88 years      BP:           130/81 mmHg Patient Gender: M             HR:           88 bpm. Exam Location:  [facility] Procedure: 2D Echo, Cardiac Doppler and Color Doppler Indications:     Dyspnea R06.00  History:         Patient has no prior history of Echocardiogram examinations.                  Pulmonary HTN; Risk Factors:Hypertension.  Sonographer:     Cristela Blue Referring Phys:  [id number] Marcelino Duster Diagnosing Phys: Alwyn Pea MD IMPRESSIONS  1. Left ventricular ejection fraction, by estimation, is 65 to 70%. The left ventricle has normal function. The left ventricle has no regional wall motion abnormalities. There is moderate concentric left ventricular hypertrophy. Left ventricular diastolic parameters are consistent with Grade III diastolic dysfunction (restrictive).  2. Right ventricular systolic function is normal. The right ventricular size is normal. Mildly increased right ventricular wall thickness.  3. The mitral valve is normal in structure. Mild mitral valve regurgitation.  4. The aortic valve is calcified. There is moderate calcification of the aortic valve. Aortic valve regurgitation is not visualized. Aortic valve sclerosis/calcification is present, without any evidence of aortic stenosis. Conclusion(s)/Recommendation(s): Poor windows for evaluation of left ventricular function by transthoracic echocardiography. Would recommend an alternative means of evaluation. FINDINGS  Left Ventricle: Left ventricular ejection fraction, by estimation, is 65 to 70%. The left ventricle has normal function. The left ventricle has no regional wall motion abnormalities. The left  ventricular internal cavity size was normal in size. There is  moderate concentric left ventricular hypertrophy. Left ventricular diastolic parameters are consistent with Grade III diastolic dysfunction (restrictive). Right Ventricle: The right ventricular size is normal. Mildly increased right ventricular wall thickness. Right ventricular systolic function is normal. Left Atrium: Left atrial size was normal in size. Right Atrium: Right atrial size was normal in size. Pericardium: There is no evidence of pericardial effusion. Mitral Valve: The mitral valve is normal in structure. Mild mitral valve regurgitation. Tricuspid Valve: The tricuspid valve is normal in structure. Tricuspid valve regurgitation is mild. Aortic Valve: The aortic valve is calcified. There is moderate calcification of the aortic valve. Aortic valve  regurgitation is not visualized. Aortic valve sclerosis/calcification is present, without any evidence of aortic stenosis. Aortic valve mean gradient measures 3.0 mmHg. Aortic valve peak gradient measures 4.8 mmHg. Aortic valve area, by VTI measures 2.31 cm. Pulmonic Valve: The pulmonic valve was normal in structure. Pulmonic valve regurgitation is not visualized. Aorta: The ascending aorta was not well visualized. IAS/Shunts: No atrial level shunt detected by color flow Doppler.  LEFT VENTRICLE PLAX 2D LVIDd:         3.80 cm LVIDs:         2.40 cm LV PW:         1.30 cm LV IVS:        1.50 cm LVOT diam:     2.00 cm LV SV:         48 LV SV Index:   23 LVOT Area:     3.14 cm  RIGHT VENTRICLE RV Basal diam:  4.30 cm RV Mid diam:    3.30 cm LEFT ATRIUM           Index        RIGHT ATRIUM           Index LA diam:      3.70 cm 1.73 cm/m   RA Area:     22.60 cm LA Vol (A2C): 66.7 ml 31.22 ml/m  RA Volume:   66.70 ml  31.22 ml/m LA Vol (A4C): 50.6 ml 23.69 ml/m  AORTIC VALVE AV Area (Vmax):    2.19 cm AV Area (Vmean):   2.18 cm AV Area (VTI):     2.31 cm AV Vmax:           110.00 cm/s AV Vmean:           77.000 cm/s AV VTI:            0.209 m AV Peak Grad:      4.8 mmHg AV Mean Grad:      3.0 mmHg LVOT Vmax:         76.60 cm/s LVOT Vmean:        53.500 cm/s LVOT VTI:          0.154 m LVOT/AV VTI ratio: 0.74  AORTA Ao Root diam: 3.50 cm MITRAL VALVE               TRICUSPID VALVE MV Area (PHT): 3.64 cm    TR Peak grad:   30.9 mmHg MV Decel Time: 209 msec    TR Vmax:        278.00 cm/s MV E velocity: 99.45 cm/s MV A velocity: 26.70 cm/s  SHUNTS MV E/A ratio:  3.72        Systemic VTI:  0.15 m                            Systemic Diam: 2.00 cm Alwyn Pea MD Electronically signed by Alwyn Pea MD Signature Date/Time: 12/18/2022/2:04:44 PM    Final    CT Angio Chest/Abd/Pel for Dissection W and/or Wo Contrast  Result Date: 12/16/2022 CLINICAL DATA:  Acute aortic syndrome suspected. EXAM: CT ANGIOGRAPHY CHEST, ABDOMEN AND PELVIS TECHNIQUE: Non-contrast CT of the chest was initially obtained. Multidetector CT imaging through the chest, abdomen and pelvis was performed using the standard protocol during bolus administration of intravenous contrast. Multiplanar reconstructed images and MIPs were obtained and reviewed to evaluate the vascular anatomy. RADIATION DOSE REDUCTION: This exam was performed according to the  departmental dose-optimization program which includes automated exposure control, adjustment of the mA and/or kV according to patient size and/or use of iterative reconstruction technique. CONTRAST:  75mL OMNIPAQUE IOHEXOL 350 MG/ML SOLN COMPARISON:  CT abdomen and pelvis 12/09/2006 FINDINGS: CTA CHEST FINDINGS Cardiovascular: There is adequate opacification of the thoracic aorta. The ascending aorta is dilated measuring 4.3 x 3.7 cm. There is no evidence for aortic dissection. Descending thoracic aorta is mildly dilated measuring up to 3.1 cm. There is mild calcified atherosclerotic disease throughout the aorta. The heart is mildly enlarged. There are atherosclerotic calcifications of the  coronary arteries. There is no pericardial effusion. Mediastinum/Nodes: There are no large and enlarged paratracheal lymph nodes. The largest measures 2.8 by 2.8 cm. There is an enlarged subcarinal lymph node measuring 1.6 cm short axis. Enlarged posterior mediastinal lymph nodes adjacent to the esophagus measure 2.0 x 2.4 by 4.6 cm. Enlarged right hilar lymph nodes measure up to 1.8 cm short axis. There are numerous nonenlarged prevascular lymph nodes. Visualized esophagus and thyroid gland are within normal limits. Lungs/Pleura: Peripheral reticular opacities are seen throughout both lungs with some associated honeycombing and ground-glass opacities also seen peripherally. Focal ground-glass opacity seen in the right lower lobe measuring 3.3 by 2.0 by 3.3 cm. There is no pleural effusion or pneumothorax. Musculoskeletal: No chest wall abnormality. No acute or significant osseous findings. Review of the MIP images confirms the above findings. CTA ABDOMEN AND PELVIS FINDINGS VASCULAR Aorta: Normal caliber aorta without aneurysm, dissection, vasculitis or significant stenosis. There is calcified atherosclerotic disease throughout the aorta. Celiac: Patent without evidence of aneurysm, dissection, vasculitis or significant stenosis. SMA: Patent without evidence of aneurysm, dissection, vasculitis or significant stenosis. Renals: Both renal arteries are patent without evidence of aneurysm, dissection, vasculitis, fibromuscular dysplasia or significant stenosis. IMA: Patent without evidence of aneurysm, dissection, vasculitis or significant stenosis. Inflow: Patent without evidence of aneurysm, dissection, vasculitis or significant stenosis. Veins: No obvious venous abnormality within the limitations of this arterial phase study. Review of the MIP images confirms the above findings. NON-VASCULAR Hepatobiliary: No focal liver abnormality is seen. Status post cholecystectomy. No biliary dilatation. Pancreas: Unremarkable.  No pancreatic ductal dilatation or surrounding inflammatory changes. Spleen: Normal in size without focal abnormality. Adrenals/Urinary Tract: Posterior left bladder diverticulum is present. The bladder is distended, but otherwise within normal limits. There is a rounded low-attenuation area in the inferior pole the right kidney measuring 4.8 cm in diameter this is decreased in size. This measures 31 Hounsfield units. Stomach/Bowel: Stomach is within normal limits. Appendix appears normal. No evidence of bowel wall thickening, distention, or inflammatory changes. There is sigmoid colon diverticulosis. Lymphatic: No enlarged lymph nodes. Reproductive: Prostate gland is enlarged. Other: There is a small fat containing left inguinal hernia. There is no free fluid. Musculoskeletal: Degenerative changes affect the spine. Review of the MIP images confirms the above findings. IMPRESSION: 1. No evidence for aortic dissection or aneurysm. 2. Ascending thoracic aortic aneurysm measuring 4.3 x 3.7 cm. Recommend annual imaging followup by CTA or MRA. This recommendation follows 2010 ACCF/AHA/AATS/ACR/ASA/SCA/SCAI/SIR/STS/SVM Guidelines for the Diagnosis and Management of Patients with Thoracic Aortic Disease. Circulation. 2010; 121: V784-O962. Aortic aneurysm NOS (ICD10-I71.9) 3. Pathologic mediastinal and right hilar lymphadenopathy of uncertain etiology. 4. Findings compatible with interstitial lung disease. 5. Ground-glass opacity in the right lower lobe measuring 3.3 cm. This may be infectious/inflammatory, but neoplasm can not be excluded. 6. 4.8 cm indeterminate right renal lesion, possibly complex cysts. Recommend further evaluation with dedicated CT or MRI.  7. Bladder diverticulum. 8. Colonic diverticulosis. 9. Prostatomegaly. 10. Small fat containing left inguinal hernia. Electronically Signed   By: Darliss Cheney M.D.   On: 12/16/2022 23:30   DG Chest 2 View  Result Date: 12/16/2022 CLINICAL DATA:  Chest pain EXAM:  CHEST - 2 VIEW COMPARISON:  08/25/2012 FINDINGS: Asymmetric bilateral interstitial pulmonary infiltrates are present, likely infectious or inflammatory in the acute setting. No pneumothorax or pleural effusion. Cardiac size is at the upper limits of normal. Right paratracheal soft tissue opacity likely represents vascular shadow. No acute bone abnormality. IMPRESSION: 1. Asymmetric bilateral interstitial pulmonary infiltrates, likely infectious or inflammatory in the acute setting. Electronically Signed   By: Helyn Numbers M.D.   On: 12/16/2022 21:31    Microbiology: Results for orders placed or performed during the hospital encounter of 12/16/22  Resp panel by RT-PCR (RSV, Flu A&B, Covid) Anterior Nasal Swab     Status: None   Collection Time: 12/16/22  9:45 PM   Specimen: Anterior Nasal Swab  Result Value Ref Range Status   SARS Coronavirus 2 by RT PCR NEGATIVE NEGATIVE Final    Comment: (NOTE) SARS-CoV-2 target nucleic acids are NOT DETECTED.  The SARS-CoV-2 RNA is generally detectable in upper respiratory specimens during the acute phase of infection. The lowest concentration of SARS-CoV-2 viral copies this assay can detect is 138 copies/mL. A negative result does not preclude SARS-Cov-2 infection and should not be used as the sole basis for treatment or other patient management decisions. A negative result may occur with  improper specimen collection/handling, submission of specimen other than nasopharyngeal swab, presence of viral mutation(s) within the areas targeted by this assay, and inadequate number of viral copies(<138 copies/mL). A negative result must be combined with clinical observations, patient history, and epidemiological information. The expected result is Negative.  Fact Sheet for Patients:  BloggerCourse.com  Fact Sheet for Healthcare Providers:  SeriousBroker.it  This test is no t yet approved or cleared by the  [place] FDA and  has been authorized for detection and/or diagnosis of SARS-CoV-2 by FDA under an Emergency Use Authorization (EUA). This EUA will remain  in effect (meaning this test can be used) for the duration of the COVID-19 declaration under Section 564(b)(1) of the Act, 21 U.S.C.section 360bbb-3(b)(1), unless the authorization is terminated  or revoked sooner.       Influenza A by PCR NEGATIVE NEGATIVE Final   Influenza B by PCR NEGATIVE NEGATIVE Final    Comment: (NOTE) The Xpert Xpress SARS-CoV-2/FLU/RSV plus assay is intended as an aid in the diagnosis of influenza from Nasopharyngeal swab specimens and should not be used as a sole basis for treatment. Nasal washings and aspirates are unacceptable for Xpert Xpress SARS-CoV-2/FLU/RSV testing.  Fact Sheet for Patients: BloggerCourse.com  Fact Sheet for Healthcare Providers: SeriousBroker.it  This test is not yet approved or cleared by the [place] FDA and has been authorized for detection and/or diagnosis of SARS-CoV-2 by FDA under an Emergency Use Authorization (EUA). This EUA will remain in effect (meaning this test can be used) for the duration of the COVID-19 declaration under Section 564(b)(1) of the Act, 21 U.S.C. section 360bbb-3(b)(1), unless the authorization is terminated or revoked.     Resp Syncytial Virus by PCR NEGATIVE NEGATIVE Final    Comment: (NOTE) Fact Sheet for Patients: BloggerCourse.com  Fact Sheet for Healthcare Providers: SeriousBroker.it  This test is not yet approved or cleared by the [place] FDA and has been authorized for detection and/or diagnosis  of SARS-CoV-2 by FDA under an Emergency Use Authorization (EUA). This EUA will remain in effect (meaning this test can be used) for the duration of the COVID-19 declaration under Section 564(b)(1) of the Act, 21 U.S.C. section  360bbb-3(b)(1), unless the authorization is terminated or revoked.  Performed at [facility], [address] [place], [place] [address]   Blood culture (routine x 2)     Status: Abnormal   Collection Time: 12/16/22 11:53 PM   Specimen: BLOOD  Result Value Ref Range Status   Specimen Description   Final    BLOOD  LAC Performed at [facility], [address], [place], [place] [address]    Special Requests   Final    BOTTLES DRAWN AEROBIC AND ANAEROBIC Blood Culture adequate volume Performed at [facility], [address] [place], [place] [address]    Culture  Setup Time   Final    Organism ID to follow ANAEROBIC BOTTLE ONLY GRAM POSITIVE COCCI CRITICAL RESULT CALLED TO, READ BACK BY AND VERIFIED WITH: CARISSA [facility] AT 1930 ON 12/17/22 BY SS Performed at [facility], [address] [place], [place] [address]    Culture (A)  Final    STAPHYLOCOCCUS EPIDERMIDIS THE SIGNIFICANCE OF ISOLATING THIS ORGANISM FROM A SINGLE SET OF BLOOD CULTURES WHEN MULTIPLE SETS ARE DRAWN IS UNCERTAIN. PLEASE NOTIFY THE MICROBIOLOGY DEPARTMENT WITHIN ONE WEEK IF SPECIATION AND SENSITIVITIES ARE REQUIRED. Performed at [facility], [address], [place], [place] [address]    Report Status 12/19/2022 FINAL  Final  Blood culture (routine x 2)     Status: None (Preliminary result)   Collection Time: 12/16/22 11:53 PM   Specimen: BLOOD  Result Value Ref Range Status   Specimen Description BLOOD  RFA  Final   Special Requests   Final    BOTTLES DRAWN AEROBIC AND ANAEROBIC Blood Culture adequate volume   Culture   Final    NO GROWTH 3 DAYS Performed at [facility], [address] [place], [place] [address]    Report Status PENDING  Incomplete  Blood Culture ID Panel (Reflexed)     Status: Abnormal   Collection Time: 12/16/22 11:53 PM  Result Value Ref Range Status   Enterococcus faecalis NOT DETECTED NOT DETECTED Final   Enterococcus  Faecium NOT DETECTED NOT DETECTED Final   Listeria monocytogenes NOT DETECTED NOT DETECTED Final   Staphylococcus species DETECTED (A) NOT DETECTED Final    Comment: CRITICAL RESULT CALLED TO, READ BACK BY AND VERIFIED WITH: CARISSA DOLAN AT 1930 ON 12/17/22 BY SS    Staphylococcus aureus (BCID) NOT DETECTED NOT DETECTED Final   Staphylococcus epidermidis DETECTED (A) NOT DETECTED Final    Comment: Methicillin (oxacillin) resistant coagulase negative staphylococcus. Possible blood culture contaminant (unless isolated from more than one blood culture draw or clinical case suggests pathogenicity). No antibiotic treatment is indicated for blood  culture contaminants. CRITICAL RESULT CALLED TO, READ BACK BY AND VERIFIED WITH: CARISSA DOLAN AT 1930 ON 12/17/22 BY SS    Staphylococcus lugdunensis NOT DETECTED NOT DETECTED Final   Streptococcus species NOT DETECTED NOT DETECTED Final   Streptococcus agalactiae NOT DETECTED NOT DETECTED Final   Streptococcus pneumoniae NOT DETECTED NOT DETECTED Final   Streptococcus pyogenes NOT DETECTED NOT DETECTED Final   A.calcoaceticus-baumannii NOT DETECTED NOT DETECTED Final   Bacteroides fragilis NOT DETECTED NOT DETECTED Final   Enterobacterales NOT DETECTED NOT DETECTED Final   Enterobacter cloacae complex NOT DETECTED NOT DETECTED  Final   Escherichia coli NOT DETECTED NOT DETECTED Final   Klebsiella aerogenes NOT DETECTED NOT DETECTED Final   Klebsiella oxytoca NOT DETECTED NOT DETECTED Final   Klebsiella pneumoniae NOT DETECTED NOT DETECTED Final   Proteus species NOT DETECTED NOT DETECTED Final   Salmonella species NOT DETECTED NOT DETECTED Final   Serratia marcescens NOT DETECTED NOT DETECTED Final   Haemophilus influenzae NOT DETECTED NOT DETECTED Final   Neisseria meningitidis NOT DETECTED NOT DETECTED Final   Pseudomonas aeruginosa NOT DETECTED NOT DETECTED Final   Stenotrophomonas maltophilia NOT DETECTED NOT DETECTED Final   Candida albicans  NOT DETECTED NOT DETECTED Final   Candida auris NOT DETECTED NOT DETECTED Final   Candida glabrata NOT DETECTED NOT DETECTED Final   Candida krusei NOT DETECTED NOT DETECTED Final   Candida parapsilosis NOT DETECTED NOT DETECTED Final   Candida tropicalis NOT DETECTED NOT DETECTED Final   Cryptococcus neoformans/gattii NOT DETECTED NOT DETECTED Final   Methicillin resistance mecA/C DETECTED (A) NOT DETECTED Final    Comment: CRITICAL RESULT CALLED TO, READ BACK BY AND VERIFIED WITH: CARISSA DOLAN AT 1930 ON 12/17/22 BY SS Performed at [facility], [address] [place], [place] [address]   MRSA Next Gen by PCR, Nasal     Status: None   Collection Time: 12/17/22  8:02 AM   Specimen: Nasal Mucosa; Nasal Swab  Result Value Ref Range Status   MRSA by PCR Next Gen NOT DETECTED NOT DETECTED Final    Comment: (NOTE) The GeneXpert MRSA Assay (FDA approved for NASAL specimens only), is one component of a comprehensive MRSA colonization surveillance program. It is not intended to diagnose MRSA infection nor to guide or monitor treatment for MRSA infections. Test performance is not FDA approved in patients less than 96 years old. Performed at [facility], [address] [place], [place] [address]   Culture, blood (Routine X 2) w Reflex to ID Panel     Status: None (Preliminary result)   Collection Time: 12/18/22 12:12 PM   Specimen: BLOOD RIGHT HAND  Result Value Ref Range Status   Specimen Description BLOOD RIGHT HAND  Final   Special Requests   Final    BOTTLES DRAWN AEROBIC AND ANAEROBIC Blood Culture adequate volume   Culture   Final    NO GROWTH 2 DAYS Performed at [facility], [address] [place], [place] [address]    Report Status PENDING  Incomplete  Culture, blood (Routine X 2) w Reflex to ID Panel     Status: None (Preliminary result)   Collection Time: 12/18/22 12:12 PM   Specimen: BLOOD  Result Value Ref Range Status   Specimen  Description BLOOD RIGHT ARM  Final   Special Requests   Final    BOTTLES DRAWN AEROBIC AND ANAEROBIC Blood Culture adequate volume   Culture   Final    NO GROWTH 2 DAYS Performed at [facility], [address] [place], [place] [address]    Report Status PENDING  Incomplete    Labs: CBC: Recent Labs  Lab 12/16/22 1907 12/17/22 0506 12/18/22 0341 12/19/22 0630 12/20/22 0528  WBC 11.3* 9.6 9.1 14.1* 11.3*  HGB 13.7 12.5* 13.0 13.8 13.2  HCT 42.5 39.2 39.9 41.0 40.2  MCV 88.7 91.0 88.7 86.5 88.4  PLT 255 222 231 260 250   Basic Metabolic Panel: Recent Labs  Lab 12/16/22 1907 12/17/22 0506 12/18/22 0341 12/19/22 0630 12/20/22 0528  NA 135 135 134* 133* 133*  K 4.1 3.5 3.5 3.5 3.7  CL 101 103 105 99 101  CO2 22 22 19* 19* 20*  GLUCOSE 109* 97 98 105* 99  BUN 38* 36* 33* 35* 37*  CREATININE 1.87* 1.64* 1.61* 1.79* 1.82*  CALCIUM 9.2 8.6* 8.5* 9.1 8.7*   Liver Function Tests: Recent Labs  Lab 12/16/22 1907 12/17/22 0506  AST 36 29  ALT 23 18  ALKPHOS 69 53  BILITOT 1.0 0.9  PROT 7.8 6.4*  ALBUMIN 4.0 3.3*   CBG: No results for input(s): "GLUCAP" in the last 168 hours.  Discharge time spent: 38 minutes.  Signed: Marcelino Duster, MD [facility] 12/20/2022

## 2022-12-22 LAB — CULTURE, BLOOD (ROUTINE X 2)
Culture: NO GROWTH
Special Requests: ADEQUATE

## 2022-12-23 ENCOUNTER — Telehealth: Payer: Self-pay | Admitting: *Deleted

## 2022-12-23 DIAGNOSIS — Z7901 Long term (current) use of anticoagulants: Secondary | ICD-10-CM | POA: Diagnosis not present

## 2022-12-23 LAB — CULTURE, BLOOD (ROUTINE X 2)
Culture: NO GROWTH
Culture: NO GROWTH
Special Requests: ADEQUATE
Special Requests: ADEQUATE

## 2022-12-23 NOTE — Progress Notes (Unsigned)
Care Coordination  Outreach Note  12/23/2022 Name: Craig Gilmore MRN: [id number] DOB: 04/19/1934   Care Coordination Outreach Attempts: An unsuccessful telephone outreach was attempted today to offer the patient information about available care coordination services.  Follow Up Plan:  Additional outreach attempts will be made to offer the patient care coordination information and services.   Encounter Outcome:  No Answer  Burman Nieves, CCMA Care Coordination Care Guide Direct Dial: [phone]

## 2022-12-24 NOTE — Progress Notes (Unsigned)
Care Coordination  Outreach Note  12/24/2022 Name: Craig Gilmore MRN: [id number] DOB: Dec 28, 1934   Care Coordination Outreach Attempts: A second unsuccessful outreach was attempted today to offer the patient with information about available care coordination services.  Follow Up Plan:  Additional outreach attempts will be made to offer the patient care coordination information and services.   Encounter Outcome:  No Answer  Burman Nieves, CCMA Care Coordination Care Guide Direct Dial: [phone]

## 2022-12-26 NOTE — Progress Notes (Signed)
Care Coordination  Outreach Note  12/26/2022 Name: HARVIN MCCULLY MRN: [id number] DOB: 1934-12-26   Care Coordination Outreach Attempts: A third unsuccessful outreach was attempted today to offer the patient with information about available care coordination services.  Follow Up Plan:  No further outreach attempts will be made at this time. We have been unable to contact the patient to offer or enroll patient in care coordination services  Encounter Outcome:  No Answer  Burman Nieves, [facility] Care Coordination Care Guide Direct Dial: [phone]

## 2022-12-27 NOTE — Progress Notes (Signed)
Care Coordination   Note   12/27/2022 Name: Craig Gilmore MRN: [id number] DOB: 28-Aug-1934  [name] is a 87 y.o. year old male who sees Jerl Mina, MD for primary care. I reached out to Nanine Means Bushard by phone today to offer care coordination services.  [name] was given information about Care Coordination services today including:   The Care Coordination services include support from the care team which includes your Nurse Coordinator, Clinical Social Worker, or Pharmacist.  The Care Coordination team is here to help remove barriers to the health concerns and goals most important to you. Care Coordination services are voluntary, and the patient may decline or stop services at any time by request to their care team member.   Care Coordination Consent Status: Patient agreed to services and verbal consent obtained.   Follow up plan:  Telephone appointment with care coordination team member scheduled for:  01/22/2023  Encounter Outcome:  Patient Scheduled from referral   Burman Nieves, [facility] Care Coordination Care Guide Direct Dial: [phone]

## 2022-12-28 DIAGNOSIS — I130 Hypertensive heart and chronic kidney disease with heart failure and stage 1 through stage 4 chronic kidney disease, or unspecified chronic kidney disease: Secondary | ICD-10-CM | POA: Diagnosis not present

## 2022-12-28 DIAGNOSIS — I5031 Acute diastolic (congestive) heart failure: Secondary | ICD-10-CM | POA: Diagnosis not present

## 2022-12-28 DIAGNOSIS — Z7901 Long term (current) use of anticoagulants: Secondary | ICD-10-CM | POA: Diagnosis not present

## 2022-12-28 DIAGNOSIS — N1832 Chronic kidney disease, stage 3b: Secondary | ICD-10-CM | POA: Diagnosis not present

## 2022-12-28 DIAGNOSIS — I712 Thoracic aortic aneurysm, without rupture, unspecified: Secondary | ICD-10-CM | POA: Diagnosis not present

## 2022-12-28 DIAGNOSIS — I160 Hypertensive urgency: Secondary | ICD-10-CM | POA: Diagnosis not present

## 2022-12-28 DIAGNOSIS — J189 Pneumonia, unspecified organism: Secondary | ICD-10-CM | POA: Diagnosis not present

## 2022-12-28 DIAGNOSIS — A419 Sepsis, unspecified organism: Secondary | ICD-10-CM | POA: Diagnosis not present

## 2022-12-28 DIAGNOSIS — I495 Sick sinus syndrome: Secondary | ICD-10-CM | POA: Diagnosis not present

## 2022-12-28 DIAGNOSIS — I482 Chronic atrial fibrillation, unspecified: Secondary | ICD-10-CM | POA: Diagnosis not present

## 2022-12-31 DIAGNOSIS — I4891 Unspecified atrial fibrillation: Secondary | ICD-10-CM | POA: Diagnosis not present

## 2022-12-31 DIAGNOSIS — R590 Localized enlarged lymph nodes: Secondary | ICD-10-CM | POA: Diagnosis not present

## 2022-12-31 DIAGNOSIS — R052 Subacute cough: Secondary | ICD-10-CM | POA: Diagnosis not present

## 2022-12-31 DIAGNOSIS — R918 Other nonspecific abnormal finding of lung field: Secondary | ICD-10-CM | POA: Diagnosis not present

## 2022-12-31 DIAGNOSIS — N2889 Other specified disorders of kidney and ureter: Secondary | ICD-10-CM | POA: Diagnosis not present

## 2022-12-31 DIAGNOSIS — N401 Enlarged prostate with lower urinary tract symptoms: Secondary | ICD-10-CM | POA: Diagnosis not present

## 2023-01-01 DIAGNOSIS — Z7901 Long term (current) use of anticoagulants: Secondary | ICD-10-CM | POA: Diagnosis not present

## 2023-01-01 DIAGNOSIS — I5031 Acute diastolic (congestive) heart failure: Secondary | ICD-10-CM | POA: Diagnosis not present

## 2023-01-01 DIAGNOSIS — I482 Chronic atrial fibrillation, unspecified: Secondary | ICD-10-CM | POA: Diagnosis not present

## 2023-01-01 DIAGNOSIS — I712 Thoracic aortic aneurysm, without rupture, unspecified: Secondary | ICD-10-CM | POA: Diagnosis not present

## 2023-01-01 DIAGNOSIS — N1832 Chronic kidney disease, stage 3b: Secondary | ICD-10-CM | POA: Diagnosis not present

## 2023-01-01 DIAGNOSIS — J189 Pneumonia, unspecified organism: Secondary | ICD-10-CM | POA: Diagnosis not present

## 2023-01-01 DIAGNOSIS — A419 Sepsis, unspecified organism: Secondary | ICD-10-CM | POA: Diagnosis not present

## 2023-01-01 DIAGNOSIS — I495 Sick sinus syndrome: Secondary | ICD-10-CM | POA: Diagnosis not present

## 2023-01-01 DIAGNOSIS — I160 Hypertensive urgency: Secondary | ICD-10-CM | POA: Diagnosis not present

## 2023-01-01 DIAGNOSIS — I130 Hypertensive heart and chronic kidney disease with heart failure and stage 1 through stage 4 chronic kidney disease, or unspecified chronic kidney disease: Secondary | ICD-10-CM | POA: Diagnosis not present

## 2023-01-02 ENCOUNTER — Ambulatory Visit (INDEPENDENT_AMBULATORY_CARE_PROVIDER_SITE_OTHER): Payer: No Typology Code available for payment source | Admitting: Urology

## 2023-01-02 ENCOUNTER — Encounter: Payer: Self-pay | Admitting: Urology

## 2023-01-02 VITALS — BP 188/83 | HR 101 | Ht 67.0 in | Wt 206.0 lb

## 2023-01-02 DIAGNOSIS — I10 Essential (primary) hypertension: Secondary | ICD-10-CM | POA: Diagnosis not present

## 2023-01-02 DIAGNOSIS — R3914 Feeling of incomplete bladder emptying: Secondary | ICD-10-CM | POA: Diagnosis not present

## 2023-01-02 DIAGNOSIS — E782 Mixed hyperlipidemia: Secondary | ICD-10-CM | POA: Diagnosis not present

## 2023-01-02 DIAGNOSIS — N401 Enlarged prostate with lower urinary tract symptoms: Secondary | ICD-10-CM | POA: Diagnosis not present

## 2023-01-02 DIAGNOSIS — I480 Paroxysmal atrial fibrillation: Secondary | ICD-10-CM | POA: Diagnosis not present

## 2023-01-02 DIAGNOSIS — N281 Cyst of kidney, acquired: Secondary | ICD-10-CM | POA: Diagnosis not present

## 2023-01-02 DIAGNOSIS — R339 Retention of urine, unspecified: Secondary | ICD-10-CM

## 2023-01-02 LAB — BLADDER SCAN AMB NON-IMAGING: Scan Result: 585

## 2023-01-02 NOTE — Progress Notes (Signed)
I,Amy L Pierron,acting as a scribe for Riki Altes, MD.,have documented all relevant documentation on the behalf of Riki Altes, MD,as directed by  Riki Altes, MD while in the presence of Riki Altes, MD.  01/02/2023 3:47 PM   Craig Gilmore 01-08-1935 [id number]  Referring provider: Jerl Mina, MD [address] [facility] [place],  [place] [address]  Chief Complaint  Patient presents with   Urinary Retention    HPI: Craig Gilmore is a 88 y.o. male who presents today following recent hospitalization for urinary retention.  He was admitted to [facility] on 12/16/2022 with sepsis secondary to pneumonia.  His wife states prior to his hospitalization he was having increased urinary frequency, post-void dribbling and getting up several times per night to void. CT chest/ abdomen/ pelvis was performed on admission. He was noted to have a distended bladder, though no hydronephrosis. A Foley catheter was placed, and she states he was started on Tamsulosin halfway through his hospitalization.  Catheter remained indwelling for three days and was removed on the day of discharge 12/20/22.  He has continued on his Tamsulosin. His wife states he still has urinary frequency and post-void dribbling, which is slightly improved. His nocturia has significantly improved, and he is getting up 0-2 times per night. No dysuria/ gross immature.  No previous history of urologic problems.   PMH: Past Medical History:  Diagnosis Date   Chronic atrial fibrillation (HCC)    a.) CHA2DS2VASc = 3 (age x 2, HTN);  b.) s/p DCCV 08/20/2004 --> 50 J x 1 and 100 J x 1 --> coverted with SB. 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 If you have any questions, ask your nurse or doctor.          buPROPion 150 MG 24 hr tablet Commonly known as: WELLBUTRIN XL Take 75 mg by mouth every morning.   febuxostat 40 MG tablet Commonly known as: ULORIC Take 40 mg by mouth every evening.   Fenofibric Acid 135 MG Cpdr  Take 1 capsule by mouth  daily What changed:  how much to take how to take this when to take this   fluticasone 50 MCG/ACT nasal spray Commonly known as: FLONASE Place 1 spray into both nostrils at bedtime.   furosemide 20 MG tablet Commonly known as: LASIX Take 1 tablet (20 mg total) by mouth daily as needed for fluid or edema (Take 1 tablet for worsening leg swelling, shortness of breath, or weight gain of greater than 3 lbs.).   ipratropium 0.03 % nasal spray Commonly known as: ATROVENT Place 2 sprays into both nostrils every morning.   levocetirizine 5 MG tablet Commonly known as: XYZAL Take 5 mg by mouth every evening.   omeprazole 20 MG tablet Commonly known as: PRILOSEC  OTC Take 20 mg by mouth daily before breakfast.   sertraline 100 MG tablet Commonly known as: ZOLOFT Take 150 mg by mouth every morning.   tamsulosin 0.4 MG Caps capsule Commonly known as: Flomax Take 1 capsule (0.4 mg total) by mouth daily after supper.   warfarin 2.5 MG tablet Commonly known as: COUMADIN Take 1 tablet (2.5 mg total) by mouth every evening. Take as directed by anticoagulation clinic   zolpidem 10 MG tablet Commonly known as: AMBIEN Take 10 mg by mouth at bedtime.        Allergies:  Allergies  Allergen Reactions   Baycol [Cerivastatin]    Pravastatin     Family History: Family History  Problem Relation Age of Onset   Diabetes Mother    Heart disease Mother    Heart disease Father    Aneurysm Father     Social History:  reports that he quit smoking about 33 years ago. His smoking use included cigarettes. He started smoking about 83 years ago. He has a 75 pack-year smoking history. He has never used smokeless tobacco. He reports that he does not drink alcohol and does not use drugs.   Physical Exam: BP (!) 188/83   Pulse (!) 101   Ht 5\' 7"  (1.702 m)   Wt 206 lb (93.4 kg)   BMI 32.26 kg/m   Constitutional:  Alert and oriented, No acute distress. HEENT: [place] AT, moist mucus membranes.  Trachea midline, no masses. Cardiovascular: No clubbing, cyanosis, or edema. Respiratory: Normal respiratory effort, no increased work of breathing. GI: Abdomen is soft, nontender, nondistended, no abdominal masses GU: Prostate 50 grams, smooth without nodules.  Skin: No rashes, bruises or suspicious lesions. Neurologic: Grossly intact, no focal deficits, moving all 4 extremities. Psychiatric: Normal mood and affect.  Laboratory Data: Urinalysis dipstick unremarkable/ microscopy   Pertinent Imaging: CT angio/chest/abdomen/pelvis performed 12/16/22. The prostate volume was calculated at ~ 1 L. There is a bladder diverticulum present. No upper tract abnormalities  noted. Prostate volume was calculated at 58 cc's.  4.8 cm left renal mass. He was noted to have a simple renal cyst on CT abdomen pelvis with contrast in 2008, which was stable on ultrasound 2012.  This cyst is in the same location as the current CT finding.   Assessment & Plan:    1. BPH with urinary retention PVR today is 586. Bladder volume at the time of initial catheter placement was greater than 1,000 cc's and the catheter only remained indwelling for 3 days. He has only been on Tamsulosin 2 weeks.  We discussed options of replacing his Foley catheter and leaving in place for 7-10 days with a repeat voiding trial. Since he has only been on Tamsulosin for  2 weeks, we also discussed continuing the medication and a getting a follow-up bladder scan in 2 weeks with a Foley catheter placement if residual persistently elevated.  Since he is minimally symptomatic, he has elected the latter and will schedule a PA follow-up two weeks with bladder scan, symptom recheck.   2. Left renal cyst Radiologists interpreted this as an indeterminate mass on CT. though decreased in size this is the same cyst previously noted imaging 2000 11/2010   I have reviewed the above documentation for accuracy and completeness, and I agree with the above.   Riki Altes, MD  [facility] [address] [place], [place] [address] [phone]

## 2023-01-03 ENCOUNTER — Encounter: Payer: Self-pay | Admitting: Urology

## 2023-01-03 LAB — MICROSCOPIC EXAMINATION: Bacteria, UA: NONE SEEN

## 2023-01-03 LAB — URINALYSIS, COMPLETE
Bilirubin, UA: NEGATIVE
Glucose, UA: NEGATIVE
Ketones, UA: NEGATIVE
Leukocytes,UA: NEGATIVE
Nitrite, UA: NEGATIVE
Specific Gravity, UA: 1.02 (ref 1.005–1.030)
Urobilinogen, Ur: 0.2 mg/dL (ref 0.2–1.0)
pH, UA: 6 (ref 5.0–7.5)

## 2023-01-06 ENCOUNTER — Other Ambulatory Visit: Payer: Self-pay | Admitting: Family Medicine

## 2023-01-06 DIAGNOSIS — N2889 Other specified disorders of kidney and ureter: Secondary | ICD-10-CM

## 2023-01-08 DIAGNOSIS — G4733 Obstructive sleep apnea (adult) (pediatric): Secondary | ICD-10-CM | POA: Diagnosis not present

## 2023-01-08 DIAGNOSIS — I10 Essential (primary) hypertension: Secondary | ICD-10-CM | POA: Diagnosis not present

## 2023-01-09 DIAGNOSIS — A419 Sepsis, unspecified organism: Secondary | ICD-10-CM | POA: Diagnosis not present

## 2023-01-09 DIAGNOSIS — I482 Chronic atrial fibrillation, unspecified: Secondary | ICD-10-CM | POA: Diagnosis not present

## 2023-01-09 DIAGNOSIS — I5031 Acute diastolic (congestive) heart failure: Secondary | ICD-10-CM | POA: Diagnosis not present

## 2023-01-09 DIAGNOSIS — I495 Sick sinus syndrome: Secondary | ICD-10-CM | POA: Diagnosis not present

## 2023-01-09 DIAGNOSIS — I160 Hypertensive urgency: Secondary | ICD-10-CM | POA: Diagnosis not present

## 2023-01-09 DIAGNOSIS — N1832 Chronic kidney disease, stage 3b: Secondary | ICD-10-CM | POA: Diagnosis not present

## 2023-01-09 DIAGNOSIS — I130 Hypertensive heart and chronic kidney disease with heart failure and stage 1 through stage 4 chronic kidney disease, or unspecified chronic kidney disease: Secondary | ICD-10-CM | POA: Diagnosis not present

## 2023-01-09 DIAGNOSIS — I712 Thoracic aortic aneurysm, without rupture, unspecified: Secondary | ICD-10-CM | POA: Diagnosis not present

## 2023-01-09 DIAGNOSIS — Z7901 Long term (current) use of anticoagulants: Secondary | ICD-10-CM | POA: Diagnosis not present

## 2023-01-09 DIAGNOSIS — J189 Pneumonia, unspecified organism: Secondary | ICD-10-CM | POA: Diagnosis not present

## 2023-01-10 ENCOUNTER — Other Ambulatory Visit: Payer: Self-pay | Admitting: Family Medicine

## 2023-01-10 ENCOUNTER — Ambulatory Visit
Admission: RE | Admit: 2023-01-10 | Discharge: 2023-01-10 | Disposition: A | Discharge status: Home / Self Care | Payer: No Typology Code available for payment source | Source: Ambulatory Visit | Attending: Family Medicine | Admitting: Family Medicine

## 2023-01-10 DIAGNOSIS — N2889 Other specified disorders of kidney and ureter: Secondary | ICD-10-CM

## 2023-01-10 DIAGNOSIS — N281 Cyst of kidney, acquired: Secondary | ICD-10-CM | POA: Diagnosis not present

## 2023-01-10 DIAGNOSIS — Z9049 Acquired absence of other specified parts of digestive tract: Secondary | ICD-10-CM | POA: Diagnosis not present

## 2023-01-10 DIAGNOSIS — K7689 Other specified diseases of liver: Secondary | ICD-10-CM | POA: Diagnosis not present

## 2023-01-10 NOTE — Progress Notes (Signed)
Patient arrived to MRI for an MRI Abd with and without contrast. Pt did appear to have some mild confusion at times. Pt started exam but refused any further imaging 3/4ths through his scan. Pt stated he did not wish to undergo any more of his test. Clinical education was provided for importance of Contrast but stated he did not wish to continue.

## 2023-01-16 ENCOUNTER — Ambulatory Visit: Payer: No Typology Code available for payment source | Admitting: Physician Assistant

## 2023-01-16 DIAGNOSIS — G4733 Obstructive sleep apnea (adult) (pediatric): Secondary | ICD-10-CM | POA: Diagnosis not present

## 2023-01-16 DIAGNOSIS — N2889 Other specified disorders of kidney and ureter: Secondary | ICD-10-CM | POA: Diagnosis not present

## 2023-01-16 DIAGNOSIS — R911 Solitary pulmonary nodule: Secondary | ICD-10-CM | POA: Diagnosis not present

## 2023-01-16 DIAGNOSIS — H903 Sensorineural hearing loss, bilateral: Secondary | ICD-10-CM | POA: Diagnosis not present

## 2023-01-16 DIAGNOSIS — G4700 Insomnia, unspecified: Secondary | ICD-10-CM | POA: Diagnosis not present

## 2023-01-16 DIAGNOSIS — C44229 Squamous cell carcinoma of skin of left ear and external auricular canal: Secondary | ICD-10-CM | POA: Diagnosis not present

## 2023-01-16 DIAGNOSIS — H6122 Impacted cerumen, left ear: Secondary | ICD-10-CM | POA: Diagnosis not present

## 2023-01-16 DIAGNOSIS — I10 Essential (primary) hypertension: Secondary | ICD-10-CM | POA: Diagnosis not present

## 2023-01-16 DIAGNOSIS — D649 Anemia, unspecified: Secondary | ICD-10-CM | POA: Diagnosis not present

## 2023-01-17 ENCOUNTER — Ambulatory Visit: Payer: No Typology Code available for payment source | Admitting: Physician Assistant

## 2023-01-17 VITALS — BP 165/80 | HR 84 | Ht 67.0 in | Wt 206.0 lb

## 2023-01-17 DIAGNOSIS — N401 Enlarged prostate with lower urinary tract symptoms: Secondary | ICD-10-CM | POA: Diagnosis not present

## 2023-01-17 DIAGNOSIS — R3914 Feeling of incomplete bladder emptying: Secondary | ICD-10-CM | POA: Diagnosis not present

## 2023-01-17 DIAGNOSIS — N3289 Other specified disorders of bladder: Secondary | ICD-10-CM

## 2023-01-17 DIAGNOSIS — R339 Retention of urine, unspecified: Secondary | ICD-10-CM

## 2023-01-17 LAB — URINALYSIS, COMPLETE
Bilirubin, UA: NEGATIVE
Glucose, UA: NEGATIVE
Ketones, UA: NEGATIVE
Leukocytes,UA: NEGATIVE
Nitrite, UA: NEGATIVE
Specific Gravity, UA: 1.02 (ref 1.005–1.030)
Urobilinogen, Ur: 0.2 mg/dL (ref 0.2–1.0)
pH, UA: 6 (ref 5.0–7.5)

## 2023-01-17 LAB — MICROSCOPIC EXAMINATION

## 2023-01-17 LAB — BLADDER SCAN AMB NON-IMAGING: Scan Result: 834

## 2023-01-17 MED ORDER — OXYBUTYNIN CHLORIDE 5 MG PO TABS
5.0000 mg | ORAL_TABLET | Freq: Three times a day (TID) | ORAL | 1 refills | Status: DC | PRN
Start: 2023-01-17 — End: 2023-02-19

## 2023-01-17 NOTE — Progress Notes (Signed)
01/17/2023 11:01 AM   Craig Gilmore 11-Oct-1934 [id number]  CC: Chief Complaint  Patient presents with   Benign Prostatic Hypertrophy   HPI: Craig Gilmore is a 88 y.o. male with PMH memory impairment; chronic frequency, nocturia, and postvoid dribbling; and a recent history of urinary retention during hospitalization for sepsis due to pneumonia who presents today for repeat voiding trial on Flomax.  He is accompanied today by his wife and son, who contribute to HPI.  Today he reports he remains on Flomax and has continued to urinate, however he and his family suspect he has a full bladder.  Patient reports mild abdominal fullness/discomfort.  Family has noticed that he is gripping his abdomen and is having frequent, low volume voids.  Notably, his wife mentions that they did have some concerns about him pulling at his catheter before due to his memory issues.  PVR .  PMH: Past Medical History:  Diagnosis Date   Chronic atrial fibrillation (HCC)    a.) CHA2DS2VASc = 3 (age x 2, HTN);  b.) s/p DCCV 08/20/2004 --> 50 J x 1 and 100 J x 1 --> coverted with SB. 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 If you have any questions, ask your nurse or doctor.          buPROPion 150 MG 24 hr tablet Commonly known as: WELLBUTRIN XL Take 75 mg by mouth every morning.   febuxostat 40 MG tablet Commonly known as: ULORIC Take 40 mg by mouth every evening.   Fenofibric Acid 135 MG Cpdr Take 1 capsule by mouth  daily What changed:  how much to take how to take this when to take this   fluticasone 50 MCG/ACT nasal spray Commonly known as: FLONASE Place 1 spray into both nostrils at bedtime.   furosemide 20 MG tablet Commonly known as: LASIX Take 1 tablet (20 mg total) by mouth daily as needed for fluid or edema (Take 1 tablet for worsening leg swelling, shortness of breath, or weight gain of greater than 3 lbs.).  ipratropium 0.03 % nasal spray Commonly known as: ATROVENT Place 2 sprays into both nostrils every morning.   levocetirizine 5 MG tablet Commonly known as: XYZAL Take 5 mg by mouth every evening.   omeprazole 20 MG tablet Commonly known as: PRILOSEC OTC Take 20 mg by mouth daily before breakfast.   sertraline 100 MG tablet Commonly known as: ZOLOFT Take 150 mg by mouth every morning.   tamsulosin 0.4 MG Caps capsule Commonly known as: Flomax Take 1 capsule (0.4 mg total) by mouth daily after supper.   warfarin 2.5 MG tablet Commonly known as: COUMADIN Take 1 tablet (2.5 mg total) by mouth every evening. Take as directed by anticoagulation clinic   zolpidem 10 MG tablet Commonly known as: AMBIEN Take 10 mg by mouth at bedtime.        Allergies:  Allergies   Allergen Reactions   Baycol [Cerivastatin]    Pravastatin     Family History: Family History  Problem Relation Age of Onset   Diabetes Mother    Heart disease Mother    Heart disease Father    Aneurysm Father     Social History:   reports that he quit smoking about 33 years ago. His smoking use included cigarettes. He started smoking about 83 years ago. He has a 75 pack-year smoking history. He has never used smokeless tobacco. He reports that he does not drink alcohol and does not use drugs.  Physical Exam: BP (!) 165/80   Pulse 84   Ht 5\' 7"  (1.702 m)   Wt 206 lb (93.4 kg)   BMI 32.26 kg/m   Constitutional:  Alert and oriented, no acute distress, nontoxic appearing HEENT: [place], AT Cardiovascular: No clubbing, cyanosis, or edema Respiratory: Normal respiratory effort, no increased work of breathing Skin: No rashes, bruises or suspicious lesions Neurologic: Grossly intact, no focal deficits, moving all 4 extremities Psychiatric: Normal mood and affect  Laboratory Data: Results for orders placed or performed in visit on 01/17/23  Bladder Scan (Post Void Residual) in office  Result Value Ref Range   Scan Result 834    Simple Catheter Placement  Due to urinary retention patient is present today for a foley cath placement.  Patient was cleaned and prepped in a sterile fashion with betadine and 2% lidocaine jelly was instilled into the urethra. A 18 FR coude foley catheter was inserted, urine return was noted  , urine was yellow in color.  The balloon was filled with 10cc of sterile water.  A leg bag was attached for drainage. Patient was also given a night bag to take home and was given instruction on how to change from one bag to another.  Patient was given instruction on proper catheter care.  Patient tolerated well, no complications were noted.  Performed by: Carman Ching, PA-C   Assessment & Plan:   1. Benign prostatic hyperplasia with incomplete bladder  emptying PVR significantly elevated over prior, though he is minimally symptomatic.  I suspect this is a chronic issue for him and his family agree.  Given mild symptoms, I offered him Foley catheter replacement for bladder decompression today and he accepted, see procedure note above.  Counseled him to continue Flomax and will plan for voiding trial in about 10 days.  If unsuccessful, may consider cystoscopy TRUS for consideration of outlet procedures.  With his cognitive issues, I do think he would benefit from an outlet procedure if needed to try to get him catheter free due to his risk  for traumatic Foley pulls. - Bladder Scan (Post Void Residual) in office - Urinalysis, Complete  Return in about 10 days (around 01/27/2023) for Voiding trial.  Carman Ching, PA-C  [facility] [address], [address] [place], [place] [address] [phone]

## 2023-01-22 ENCOUNTER — Ambulatory Visit: Payer: Self-pay | Admitting: *Deleted

## 2023-01-22 DIAGNOSIS — I5031 Acute diastolic (congestive) heart failure: Secondary | ICD-10-CM | POA: Diagnosis not present

## 2023-01-22 DIAGNOSIS — Z7901 Long term (current) use of anticoagulants: Secondary | ICD-10-CM | POA: Diagnosis not present

## 2023-01-22 DIAGNOSIS — I712 Thoracic aortic aneurysm, without rupture, unspecified: Secondary | ICD-10-CM | POA: Diagnosis not present

## 2023-01-22 DIAGNOSIS — I482 Chronic atrial fibrillation, unspecified: Secondary | ICD-10-CM | POA: Diagnosis not present

## 2023-01-22 DIAGNOSIS — I160 Hypertensive urgency: Secondary | ICD-10-CM | POA: Diagnosis not present

## 2023-01-22 DIAGNOSIS — N1832 Chronic kidney disease, stage 3b: Secondary | ICD-10-CM | POA: Diagnosis not present

## 2023-01-22 DIAGNOSIS — A419 Sepsis, unspecified organism: Secondary | ICD-10-CM | POA: Diagnosis not present

## 2023-01-22 DIAGNOSIS — I130 Hypertensive heart and chronic kidney disease with heart failure and stage 1 through stage 4 chronic kidney disease, or unspecified chronic kidney disease: Secondary | ICD-10-CM | POA: Diagnosis not present

## 2023-01-22 DIAGNOSIS — I495 Sick sinus syndrome: Secondary | ICD-10-CM | POA: Diagnosis not present

## 2023-01-22 DIAGNOSIS — J189 Pneumonia, unspecified organism: Secondary | ICD-10-CM | POA: Diagnosis not present

## 2023-01-22 NOTE — Patient Outreach (Signed)
Care Coordination   Initial Visit Note   01/22/2023 Name: Craig Gilmore MRN: [id number] DOB: 1934/07/12  Craig Gilmore is a 88 y.o. year old male who sees Jerl Mina, MD for primary care. I spoke with wife of Craig Gilmore by phone today.  What matters to the patients health and wellness today?  Patient recently experienced urinary retention requiring a catheter insertion.  He will have it removed next week with a void trial.  Wife provides transportation to appointments.  Denies any urgent concerns, encouraged to contact this care manager with questions.      Goals Addressed             This Visit's Progress    Care Coordination Activities - no follow up needed       Interventions Today    Flowsheet Row Most Recent Value  Chronic Disease   Chronic disease during today's visit Other  [BPH and urinary rentention]  General Interventions   General Interventions Discussed/Reviewed General Interventions Reviewed, Doctor Visits  Doctor Visits Discussed/Reviewed Doctor Visits Reviewed, PCP, Specialist  [Pulmonary on 10/21, Urology on 10/25]  PCP/Specialist Visits Compliance with follow-up visit  [seen by PCP on 10/10]  Education Interventions   Education Provided Provided Education  Provided Verbal Education On When to see the doctor, Other, Medication  [Educated on proper catheter care]              SDOH assessments and interventions completed:  Yes  SDOH Interventions Today    Flowsheet Row Most Recent Value  SDOH Interventions   Food Insecurity Interventions Intervention Not Indicated  Housing Interventions Intervention Not Indicated  Transportation Interventions Intervention Not Indicated        Care Coordination Interventions:  Yes, provided   Follow up plan: No further intervention required.   Encounter Outcome:  Patient Visit Completed   Kemper Durie RN, MSN, CCM [facility], [facility] RN Care Coordinator Direct  Dial: [phone] / Main [phone] Fax [phone] Email: [email or web address] Website: [email or web address]

## 2023-01-24 NOTE — Telephone Encounter (Signed)
Wife calling from work stating that pt is still complaining about pain from the catheter. Wife states pt is taking oxybutynin and he is really working himself up and struggling to breathe. Wife asking for pain meds

## 2023-01-27 DIAGNOSIS — J849 Interstitial pulmonary disease, unspecified: Secondary | ICD-10-CM | POA: Diagnosis not present

## 2023-01-27 NOTE — Telephone Encounter (Signed)
Pt's wife states pt is still having pain (feeling like he needs to urinate). Pt's wife states he is emptying well and there is no blood in bag. States oxybutynin has not helped. Pt's wife states they will hold on until their appointment on Friday (their voiding trial)

## 2023-01-30 ENCOUNTER — Ambulatory Visit: Payer: No Typology Code available for payment source | Attending: Family Medicine

## 2023-01-30 DIAGNOSIS — R269 Unspecified abnormalities of gait and mobility: Secondary | ICD-10-CM

## 2023-01-30 DIAGNOSIS — R2689 Other abnormalities of gait and mobility: Secondary | ICD-10-CM | POA: Diagnosis not present

## 2023-01-30 DIAGNOSIS — R262 Difficulty in walking, not elsewhere classified: Secondary | ICD-10-CM | POA: Diagnosis present

## 2023-01-30 DIAGNOSIS — R278 Other lack of coordination: Secondary | ICD-10-CM | POA: Diagnosis not present

## 2023-01-30 DIAGNOSIS — M6281 Muscle weakness (generalized): Secondary | ICD-10-CM

## 2023-01-30 DIAGNOSIS — R2681 Unsteadiness on feet: Secondary | ICD-10-CM | POA: Diagnosis not present

## 2023-01-30 NOTE — Therapy (Signed)
OUTPATIENT PHYSICAL THERAPY NEURO EVALUATION   Patient Name: Craig Gilmore MRN: [id number] DOB:1934-07-22, 88 y.o., male Today's Date: 01/31/2023   PCP: Dr. Jerl Mina   REFERRING PROVIDER: Dr. Jerl Mina  END OF SESSION:  PT End of Session - 01/30/23 1514     Visit Number 1    Number of Visits 24    Date for PT Re-Evaluation 04/24/23    Progress Note Due on Visit 10    PT Start Time 1148    PT Stop Time 1235    PT Time Calculation (min) 47 min    Equipment Utilized During Treatment Gait belt    Activity Tolerance Patient tolerated treatment well    Behavior During Therapy WFL for tasks assessed/performed             Past Medical History:  Diagnosis Date   Chronic atrial fibrillation (HCC)    a.) CHA2DS2VASc = 3 (age x 2, HTN);  b.) s/p DCCV 08/20/2004 --> 50 J x 1 and 100 J x 1 --> coverted with SB. (+) post-procedure nausea; c.) rate/rhythm maintained without pharmacological intervention; chronically anticoagulated with warfarin   Colonic polyp    COPD (chronic obstructive pulmonary disease) (HCC)    Depression    Dyspnea    Edema    GERD (gastroesophageal reflux disease)    Gout    Hemorrhoids    Hyperlipidemia    Hypertension    Insomnia    a.) on hypnotic (zolpidem)   Long term current use of anticoagulant    a.) warfarin   OSA on CPAP    Pneumonia    Pulmonary HTN (HCC) 02/14/2016   a.) TTE 02/14/2016: RVSP 37.5 mmHg   Sick sinus syndrome (HCC)    Squamous cell carcinoma in situ 10/03/2009   L medial infrapectoral   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   L nose supratip   Stage 3b chronic kidney disease (CKD) (HCC)    Past Surgical History:  Procedure Laterality Date   CARDIOVERSION N/A 08/21/2004   Procedure: CIRECT CURRENT CARDIOVERSION; Location: [facility]; Surgeon: Harold Hedge, MD   CATARACT EXTRACTION, BILATERAL     CHOLECYSTECTOMY     COLONOSCOPY     NASAL SINUS SURGERY     Patient Active Problem List   Diagnosis Date Noted    Hyponatremia 12/19/2022   Sick sinus syndrome (HCC) 12/16/2022   Interstitial lung disease (HCC) 12/16/2022   Thoracic aortic aneurysm (HCC) 12/16/2022   Sepsis due to pneumonia (HCC) 12/16/2022   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 tophus 02/07/2022   Stage 3b chronic kidney disease (HCC) 02/07/2022   Hyperlipidemia 05/04/2015   Encounter for therapeutic drug monitoring 11/24/2013   Exertional dyspnea 11/23/2013   Chronic atrial fibrillation (HCC)    Pneumonia, organism unspecified(486) 08/17/2012    ONSET DATE: 2-3  REFERRING DIAG:  R26.81 (ICD-10-CM) - Unsteadiness on feet  R68.89 (ICD-10-CM) - Other general symptoms and signs  Z87.01 (ICD-10-CM) - Personal history of pneumonia (recurrent)    THERAPY DIAG:  Unsteadiness on feet  Other abnormalities of gait and mobility  Abnormality of gait and mobility  Difficulty in walking, not elsewhere classified  Other lack of coordination  Muscle weakness (generalized)  Rationale for Evaluation and Treatment: Rehabilitation  SUBJECTIVE:  SUBJECTIVE STATEMENT: Patient reports having some imbalance for a while but progressively worse over time. Denies any falls but family reports many near falls. Patient reports being too sedentary.    Pt accompanied by:  wife- [name] and son  PERTINENT HISTORY: Patient is a 88 year old male with referall from PCP for imbalance and past medical history includes a recent hospitalization- 12/16/2022- 5 days for sepsis and PNA. PMH includes renal lesion, A-Fib, COPD, Depression, HTN, GOUT, Sleep apnea,  and HLD.  Wife reports he has been having balance issues for 2-3 years but having more unsteadiness recently  PAIN:  Are you having pain? No  PRECAUTIONS: Fall  RED FLAGS: None   WEIGHT BEARING  RESTRICTIONS: No  FALLS: Has patient fallen in last 6 months?  No falls but several near falls.   LIVING ENVIRONMENT: Lives with: lives with their family- wife (son stays during the day)  Lives in: House/apartment Stairs: Yes: External: 3 steps; on right going up Has following equipment at home: Single point cane, Rollator  PLOF: Requires assistive device for independence and Needs assistance with ADLs  PATIENT GOALS: To improve my balance and not fall  OBJECTIVE:  Note: Objective measures were completed at Evaluation unless otherwise noted.  DIAGNOSTIC FINDINGS:  CT SCAN- 12/16/2022 IMPRESSION: 1. No evidence for aortic dissection or aneurysm. 2. Ascending thoracic aortic aneurysm measuring 4.3 x 3.7 cm. Recommend annual imaging followup by CTA or MRA. This recommendation follows 2010 ACCF/AHA/AATS/ACR/ASA/SCA/SCAI/SIR/STS/SVM Guidelines for the Diagnosis and Management of Patients with Thoracic Aortic Disease. Circulation. 2010; 121: A540-J811. Aortic aneurysm NOS (ICD10-I71.9) 3. Pathologic mediastinal and right hilar lymphadenopathy of uncertain etiology. 4. Findings compatible with interstitial lung disease. 5. Ground-glass opacity in the right lower lobe measuring 3.3 cm. This may be infectious/inflammatory, but neoplasm can not be excluded. 6. 4.8 cm indeterminate right renal lesion, possibly complex cysts. Recommend further evaluation with dedicated CT or MRI. 7. Bladder diverticulum. 8. Colonic diverticulosis. 9. Prostatomegaly. 10. Small fat containing left inguinal hernia.     Electronically Signed   By: Darliss Cheney M.D.   On: 12/16/2022 23:30  COGNITION: Overall cognitive status: Impaired- forgetful   SENSATION: WFL  COORDINATION: Delayed at times with BLE  EDEMA:  None observed   POSTURE: rounded shoulders and forward head    LOWER EXTREMITY MMT:    MMT Right Eval Left Eval  Hip flexion 4 4  Hip extension    Hip abduction 4 4  Hip  adduction 4 4  Hip internal rotation 4 4  Hip external rotation 4 4  Knee flexion 4 4  Knee extension 4 4  Ankle dorsiflexion 4 4  Ankle plantarflexion    Ankle inversion    Ankle eversion    (Blank rows = not tested)   TRANSFERS: Assistive device utilized: Single point cane  Sit to stand: Modified independence Stand to sit: Modified independence Chair to chair: Modified independence Floor:  Not tested   GAIT: Gait pattern: decreased arm swing- Right, decreased arm swing- Left, decreased step length- Right, decreased step length- Left, decreased stance time- Right, decreased stance time- Left, and decreased stride length Distance walked: > 200 feet in total Assistive device utilized: Single point cane Level of assistance: CGA Comments: decreased gait speed and VC to pick up feet  FUNCTIONAL TESTS:  5 times sit to stand: 22.68 sec withuout  Timed up and go (TUG): 13.99 sec with cane 6 minute walk test: To be performed next visit 10 meter walk test: 0.59  m/s Berg Balance Scale: 45/56  PATIENT SURVEYS:  FOTO 54  TODAY'S TREATMENT:                                                                                                                              DATE: PT EVALUATION    PATIENT EDUCATION: Education details: PT plan of care; purpose of outcome measures Person educated: Patient Education method: Explanation Education comprehension: verbalized understanding  HOME EXERCISE PROGRAM: To be implemented next 1-2 visits  GOALS: Goals reviewed with patient? Yes  SHORT TERM GOALS: Target date: 03/13/2023  Pt will be independent with HEP in order to improve strength and balance in order to decrease fall risk and improve function at home and work.  Baseline: EVAL: No formal HEP in place Goal status: INITIAL    LONG TERM GOALS: Target date: 04/24/2023  1.  Patient (> 22 years old) will complete five times sit to stand test in < 15 seconds indicating an increased LE  strength and improved balance. Baseline: EVAL: 22.68 without UE support Goal status: INITIAL  2.  Patient will increase FOTO score to equal to or greater than  55   to demonstrate statistically significant improvement in mobility and quality of life.  Baseline: EVAL=54 Goal status: INITIAL   3.  Patient will increase Berg Balance score by > 4 points to demonstrate decreased fall risk during functional activities. Baseline: 45 Goal status: INITIAL   4.   Patient will reduce timed up and go to <11 seconds to reduce fall risk and demonstrate improved transfer/gait ability. Baseline: EVAL=13.99 sec with cane Goal status: INITIAL  5.   Patient will increase 10 meter walk test to >1.55m/s as to improve gait speed for better community ambulation and to reduce fall risk. Baseline: EVAL= 0.58 m/s Goal status: INITIAL  6.   Patient will increase six minute walk test distance to >1000 for progression to community ambulator and improve gait ability Baseline: EVAL: to be assessed 2nd visit Goal status: INITIAL   ASSESSMENT:  CLINICAL IMPRESSION: Patient is a 88 y.o. year old male who was seen today for physical therapy evaluation and treatment for imbalance. He presents today with mild BLE muscle weakness, impaired gait as seen by decreased gait speed and impaired balance as seen by score on BERG Balance test. He will benefit from skilled PT services to improve his functional strength/mobility and overall balance for improved quality of life and decreased risk of falling.   OBJECTIVE IMPAIRMENTS: Abnormal gait, decreased activity tolerance, decreased balance, decreased cognition, decreased coordination, decreased endurance, decreased mobility, difficulty walking, decreased strength, and impaired flexibility.   ACTIVITY LIMITATIONS: carrying, lifting, bending, sitting, standing, squatting, stairs, and transfers  PARTICIPATION LIMITATIONS: cleaning, laundry, shopping, community activity, and yard  work  PERSONAL FACTORS: Age and 1-2 comorbidities: COPD, HTN  are also affecting patient's functional outcome.   REHAB POTENTIAL: Good  CLINICAL DECISION MAKING: Evolving/moderate complexity  EVALUATION COMPLEXITY: Moderate  PLAN:  PT FREQUENCY: 1-2x/week  PT DURATION: 12 weeks  PLANNED INTERVENTIONS: 97164- PT Re-evaluation, 97110-Therapeutic exercises, 97530- Therapeutic activity, [id number]- Neuromuscular re-education, 97535- Self Care, [address]- Manual therapy, [phone]- Gait training, [phone]- Orthotic Fit/training, [phone]- Canalith repositioning, [phone]- Electrical stimulation (manual), Patient/Family education, Balance training, Stair training, Taping, Dry Needling, Joint mobilization, Spinal mobilization, Vestibular training, Visual/preceptual remediation/compensation, DME instructions, Cryotherapy, and Moist heat  PLAN FOR NEXT SESSION: Assess 6 min walk test, Implement therex and balance training and educate in HEP next 1-2 visits.    Lenda Kelp, PT 01/31/2023, 4:07 PM

## 2023-01-31 ENCOUNTER — Ambulatory Visit: Payer: No Typology Code available for payment source | Admitting: Physician Assistant

## 2023-01-31 ENCOUNTER — Ambulatory Visit (INDEPENDENT_AMBULATORY_CARE_PROVIDER_SITE_OTHER): Payer: No Typology Code available for payment source | Admitting: Physician Assistant

## 2023-01-31 DIAGNOSIS — R339 Retention of urine, unspecified: Secondary | ICD-10-CM

## 2023-01-31 LAB — BLADDER SCAN AMB NON-IMAGING: Scan Result: 650

## 2023-01-31 MED ORDER — DUTASTERIDE 0.5 MG PO CAPS
0.5000 mg | ORAL_CAPSULE | Freq: Every day | ORAL | 11 refills | Status: DC
Start: 2023-01-31 — End: 2023-12-23

## 2023-01-31 NOTE — Patient Instructions (Signed)

## 2023-01-31 NOTE — Progress Notes (Signed)
01/31/2023 3:41 PM   Craig Gilmore 08-Mar-1935 [id number]  CC: Chief Complaint  Patient presents with   Follow-up   HPI: Craig Gilmore is a 88 y.o. male with PMH memory impairment; chronic frequency, nocturia, and postvoid dribbling; and a recent history of urinary retention during hospitalization for sepsis due to pneumonia who presents today for repeat voiding trial.  He is accompanied today by his wife and son, who contribute to HPI.  Foley catheter removed in the morning, see separate procedure note.  He returned to clinic in the afternoon.  He has voided, but his wife and son feel that he is having the same urinary frequency and bladder distention as before.  He reports that he thinks he is doing okay and he is comfortable.  Notably, he did not tolerate his Foley catheter well.  He had a lot of bladder spasms and they do not desire catheter replacement today.  They also doubt his ability to perform CIC.  Patient's brother recently informed them that he has also had a history of BPH and did particularly well on dutasteride.  They are curious about starting this medication.  PVR 650 mL.  Additionally, he is currently taking azithromycin per pulmonology.  PMH: Past Medical History:  Diagnosis Date   Chronic atrial fibrillation (HCC)    a.) CHA2DS2VASc = 3 (age x 2, HTN);  b.) s/p DCCV 08/20/2004 --> 50 J x 1 and 100 J x 1 --> coverted with SB. (+) post-procedure nausea; c.) rate/rhythm maintained without pharmacological intervention; chronically anticoagulated with warfarin   Colonic polyp    COPD (chronic obstructive pulmonary disease) (HCC)    Depression    Dyspnea    Edema    GERD (gastroesophageal reflux disease)    Gout    Hemorrhoids    Hyperlipidemia    Hypertension    Insomnia    a.) on hypnotic (zolpidem)   Long term current use of anticoagulant    a.) warfarin   OSA on CPAP    Pneumonia    Pulmonary HTN (HCC) 02/14/2016   a.) TTE 02/14/2016: RVSP 37.5 mmHg    Sick sinus syndrome (HCC)    Squamous cell carcinoma in situ 10/03/2009   L medial infrapectoral   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   L nose supratip   Stage 3b chronic kidney disease (CKD) (HCC)     Surgical History: Past Surgical History:  Procedure Laterality Date   CARDIOVERSION N/A 08/21/2004   Procedure: CIRECT CURRENT CARDIOVERSION; Location: [facility]; Surgeon: Harold Hedge, MD   CATARACT EXTRACTION, BILATERAL     CHOLECYSTECTOMY     COLONOSCOPY     NASAL SINUS SURGERY      Home Medications:  Allergies as of 01/31/2023       Reactions   Baycol [cerivastatin]    Pravastatin         Medication List        Accurate as of January 31, 2023  3:41 PM. If you have any questions, ask your nurse or doctor.          buPROPion 150 MG 24 hr tablet Commonly known as: WELLBUTRIN XL Take 75 mg by mouth every morning.   febuxostat 40 MG tablet Commonly known as: ULORIC Take 40 mg by mouth every evening.   Fenofibric Acid 135 MG Cpdr Take 1 capsule by mouth  daily What changed:  how much to take how to take this when to take this   fluticasone 50 MCG/ACT  nasal spray Commonly known as: FLONASE Place 1 spray into both nostrils at bedtime.   furosemide 20 MG tablet Commonly known as: LASIX Take 1 tablet (20 mg total) by mouth daily as needed for fluid or edema (Take 1 tablet for worsening leg swelling, shortness of breath, or weight gain of greater than 3 lbs.).   ipratropium 0.03 % nasal spray Commonly known as: ATROVENT Place 2 sprays into both nostrils every morning.   levocetirizine 5 MG tablet Commonly known as: XYZAL Take 5 mg by mouth every evening.   omeprazole 20 MG tablet Commonly known as: PRILOSEC OTC Take 20 mg by mouth daily before breakfast.   oxybutynin 5 MG tablet Commonly known as: DITROPAN Take 1 tablet (5 mg total) by mouth every 8 (eight) hours as needed for bladder spasms.   sertraline 100 MG tablet Commonly known as:  ZOLOFT Take 150 mg by mouth every morning.   tamsulosin 0.4 MG Caps capsule Commonly known as: Flomax Take 1 capsule (0.4 mg total) by mouth daily after supper.   warfarin 2.5 MG tablet Commonly known as: COUMADIN Take 1 tablet (2.5 mg total) by mouth every evening. Take as directed by anticoagulation clinic   zolpidem 10 MG tablet Commonly known as: AMBIEN Take 10 mg by mouth at bedtime.        Allergies:  Allergies  Allergen Reactions   Baycol [Cerivastatin]    Pravastatin     Family History: Family History  Problem Relation Age of Onset   Diabetes Mother    Heart disease Mother    Heart disease Father    Aneurysm Father     Social History:   reports that he quit smoking about 33 years ago. His smoking use included cigarettes. He started smoking about 83 years ago. He has a 75 pack-year smoking history. He has never used smokeless tobacco. He reports that he does not drink alcohol and does not use drugs.  Physical Exam: There were no vitals taken for this visit.  Constitutional:  Alert and oriented, no acute distress, nontoxic appearing HEENT: [place], AT Cardiovascular: No clubbing, cyanosis, or edema Respiratory: Normal respiratory effort, no increased work of breathing Skin: No rashes, bruises or suspicious lesions Neurologic: Grossly intact, no focal deficits, moving all 4 extremities Psychiatric: Normal mood and affect  Laboratory Data: Results for orders placed or performed in visit on 01/31/23  BLADDER SCAN AMB NON-IMAGING  Result Value Ref Range   Scan Result 650 ml    Assessment & Plan:   1. Urinary retention Voiding trial failed, though as per my prior note I suspect he has an element of chronic incomplete emptying and he seems to be tolerating this significantly elevated residual without significant discomfort.  Will defer Foley replacement and CIC teaching since he is felt not to tolerate these well.  We had a lengthy conversation about treatment  options.  He is unsure if he would want to pursue outlet procedures, though his wife and son are more amenable to this.  Ultimately, we elected to start dutasteride and pursue cystoscopy with Dr. Lonna Cobb in about 3 weeks for consideration of outlet procedures.  We discussed risks of dutasteride including erectile dysfunction and gynecomastia. - BLADDER SCAN AMB NON-IMAGING - dutasteride (AVODART) 0.5 MG capsule; Take 1 capsule (0.5 mg total) by mouth daily.  Dispense: 30 capsule; Refill: 11   Return in about 2 weeks (around 02/14/2023) for Cysto with Dr. Lonna Cobb.  Carman Ching, PA-C  [facility] [address]  [address], [address] [place], [place] [address] [phone]

## 2023-01-31 NOTE — Progress Notes (Signed)
Catheter Removal  Patient is present today for a catheter removal.  10ml of water was drained from the balloon. A 18FR foley cath was removed from the bladder, no complications were noted. Patient tolerated well.  Performed by: Vela Prose   Follow up/ Additional notes: No follow-ups on file.

## 2023-02-03 ENCOUNTER — Encounter: Payer: Self-pay | Admitting: Physician Assistant

## 2023-02-03 ENCOUNTER — Ambulatory Visit (INDEPENDENT_AMBULATORY_CARE_PROVIDER_SITE_OTHER): Payer: No Typology Code available for payment source | Admitting: Physician Assistant

## 2023-02-03 VITALS — BP 142/86 | Ht 67.0 in | Wt 206.0 lb

## 2023-02-03 DIAGNOSIS — I480 Paroxysmal atrial fibrillation: Secondary | ICD-10-CM | POA: Diagnosis not present

## 2023-02-03 DIAGNOSIS — R339 Retention of urine, unspecified: Secondary | ICD-10-CM

## 2023-02-03 LAB — MICROSCOPIC EXAMINATION
RBC, Urine: >30 /[HPF] — AB (ref 0–2)
WBC, UA: >30 /[HPF] — AB (ref 0–5)

## 2023-02-03 LAB — URINALYSIS, COMPLETE
Bilirubin, UA: NEGATIVE
Glucose, UA: NEGATIVE
Ketones, UA: NEGATIVE
Nitrite, UA: NEGATIVE
Specific Gravity, UA: 1.02 (ref 1.005–1.030)
Urobilinogen, Ur: 0.2 mg/dL (ref 0.2–1.0)
pH, UA: 5.5 (ref 5.0–7.5)

## 2023-02-03 MED ORDER — CEFDINIR 300 MG PO CAPS
300.0000 mg | ORAL_CAPSULE | Freq: Two times a day (BID) | ORAL | 0 refills | Status: DC
Start: 2023-02-03 — End: 2023-02-07

## 2023-02-03 NOTE — Telephone Encounter (Signed)
Pt schedule for today at 1:15pm, wife verbalized understanding.

## 2023-02-03 NOTE — Progress Notes (Signed)
Patient presented to clinic today for urgent add-on to Foley placement due to recurrent urinary frequency concerning for recurrent retention after an equivocal voiding trial with me 3 days ago.  He is accompanied today by his wife and son, who report that he is voiding small volumes very frequently and they have noticed some abdominal distention.  Simple Catheter Placement  Due to urinary retention patient is present today for a foley cath placement.  Patient was cleaned and prepped in a sterile fashion with betadine and 2% lidocaine jelly was instilled into the urethra. A 16 FR coude foley catheter was inserted, urine return was noted  , urine was yellow in color.  The balloon was filled with 10cc of sterile water.  A leg bag was attached for drainage. Patient was also given a night bag to take home and was given instruction on how to change from one bag to another.  Patient was given instruction on proper catheter care.  Patient tolerated well, no complications were noted   Performed by: Carman Ching, PA-C   Additional Notes: Urine sample obtained from Foley for UA and culture.  We are able to move up his scheduled cystoscopy with Dr. Lonna Cobb to 01/20/2023.  Follow up: Return in about 2 weeks (around 02/17/2023) for Cystoscopy with Dr. Lonna Cobb.

## 2023-02-03 NOTE — Addendum Note (Signed)
Addended by: Debarah Crape on: 02/03/2023 05:04 PM   Modules accepted: Orders

## 2023-02-03 NOTE — Therapy (Signed)
OUTPATIENT PHYSICAL THERAPY NEURO TREATMENT   Patient Name: Craig Gilmore MRN: [id number] DOB:04/17/1934, 88 y.o., male Today's Date: 02/04/2023   PCP: Dr. Jerl Mina   REFERRING PROVIDER: Dr. Jerl Mina  END OF SESSION:  PT End of Session - 02/04/23 1149     Visit Number 2    Number of Visits 24    Date for PT Re-Evaluation 04/24/23    Progress Note Due on Visit 10    PT Start Time 1149    PT Stop Time 1229    PT Time Calculation (min) 40 min    Equipment Utilized During Treatment Gait belt    Activity Tolerance Patient tolerated treatment well    Behavior During Therapy WFL for tasks assessed/performed              Past Medical History:  Diagnosis Date   Chronic atrial fibrillation (HCC)    a.) CHA2DS2VASc = 3 (age x 2, HTN);  b.) s/p DCCV 08/20/2004 --> 50 J x 1 and 100 J x 1 --> coverted with SB. (+) post-procedure nausea; c.) rate/rhythm maintained without pharmacological intervention; chronically anticoagulated with warfarin   Colonic polyp    COPD (chronic obstructive pulmonary disease) (HCC)    Depression    Dyspnea    Edema    GERD (gastroesophageal reflux disease)    Gout    Hemorrhoids    Hyperlipidemia    Hypertension    Insomnia    a.) on hypnotic (zolpidem)   Long term current use of anticoagulant    a.) warfarin   OSA on CPAP    Pneumonia    Pulmonary HTN (HCC) 02/14/2016   a.) TTE 02/14/2016: RVSP 37.5 mmHg   Sick sinus syndrome (HCC)    Squamous cell carcinoma in situ 10/03/2009   L medial infrapectoral   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   L nose supratip   Stage 3b chronic kidney disease (CKD) (HCC)    Past Surgical History:  Procedure Laterality Date   CARDIOVERSION N/A 08/21/2004   Procedure: CIRECT CURRENT CARDIOVERSION; Location: [facility]; Surgeon: Harold Hedge, MD   CATARACT EXTRACTION, BILATERAL     CHOLECYSTECTOMY     COLONOSCOPY     NASAL SINUS SURGERY     Patient Active Problem List   Diagnosis Date Noted    Hyponatremia 12/19/2022   Sick sinus syndrome (HCC) 12/16/2022   Interstitial lung disease (HCC) 12/16/2022   Thoracic aortic aneurysm (HCC) 12/16/2022   Sepsis due to pneumonia (HCC) 12/16/2022   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 tophus 02/07/2022   Stage 3b chronic kidney disease (HCC) 02/07/2022   Hyperlipidemia 05/04/2015   Encounter for therapeutic drug monitoring 11/24/2013   Exertional dyspnea 11/23/2013   Chronic atrial fibrillation (HCC)    Pneumonia, organism unspecified(486) 08/17/2012    ONSET DATE: 2-3  REFERRING DIAG:  R26.81 (ICD-10-CM) - Unsteadiness on feet  R68.89 (ICD-10-CM) - Other general symptoms and signs  Z87.01 (ICD-10-CM) - Personal history of pneumonia (recurrent)    THERAPY DIAG:  Unsteadiness on feet  Other abnormalities of gait and mobility  Abnormality of gait and mobility  Difficulty in walking, not elsewhere classified  Rationale for Evaluation and Treatment: Rehabilitation  SUBJECTIVE:  SUBJECTIVE STATEMENT: Patient's wife reports patient had two falls since last session.    Pt accompanied by:  wife- [name] and son  PERTINENT HISTORY: Patient is a 87 year old male with referall from PCP for imbalance and past medical history includes a recent hospitalization- 12/16/2022- 5 days for sepsis and PNA. PMH includes renal lesion, A-Fib, COPD, Depression, HTN, GOUT, Sleep apnea,  and HLD.  Wife reports he has been having balance issues for 2-3 years but having more unsteadiness recently  PAIN:  Are you having pain? No  PRECAUTIONS: Fall  RED FLAGS: None   WEIGHT BEARING RESTRICTIONS: No  FALLS: Has patient fallen in last 6 months?  No falls but several near falls.   LIVING ENVIRONMENT: Lives with: lives with their family- wife (son stays during  the day)  Lives in: House/apartment Stairs: Yes: External: 3 steps; on right going up Has following equipment at home: Single point cane, Rollator  PLOF: Requires assistive device for independence and Needs assistance with ADLs  PATIENT GOALS: To improve my balance and not fall  OBJECTIVE:  Note: Objective measures were completed at Evaluation unless otherwise noted.  DIAGNOSTIC FINDINGS:  CT SCAN- 12/16/2022 IMPRESSION: 1. No evidence for aortic dissection or aneurysm. 2. Ascending thoracic aortic aneurysm measuring 4.3 x 3.7 cm. Recommend annual imaging followup by CTA or MRA. This recommendation follows 2010 ACCF/AHA/AATS/ACR/ASA/SCA/SCAI/SIR/STS/SVM Guidelines for the Diagnosis and Management of Patients with Thoracic Aortic Disease. Circulation. 2010; 121: W119-J478. Aortic aneurysm NOS (ICD10-I71.9) 3. Pathologic mediastinal and right hilar lymphadenopathy of uncertain etiology. 4. Findings compatible with interstitial lung disease. 5. Ground-glass opacity in the right lower lobe measuring 3.3 cm. This may be infectious/inflammatory, but neoplasm can not be excluded. 6. 4.8 cm indeterminate right renal lesion, possibly complex cysts. Recommend further evaluation with dedicated CT or MRI. 7. Bladder diverticulum. 8. Colonic diverticulosis. 9. Prostatomegaly. 10. Small fat containing left inguinal hernia.     Electronically Signed   By: Darliss Cheney M.D.   On: 12/16/2022 23:30  COGNITION: Overall cognitive status: Impaired- forgetful   SENSATION: WFL  COORDINATION: Delayed at times with BLE  EDEMA:  None observed   POSTURE: rounded shoulders and forward head    LOWER EXTREMITY MMT:    MMT Right Eval Left Eval  Hip flexion 4 4  Hip extension    Hip abduction 4 4  Hip adduction 4 4  Hip internal rotation 4 4  Hip external rotation 4 4  Knee flexion 4 4  Knee extension 4 4  Ankle dorsiflexion 4 4  Ankle plantarflexion    Ankle inversion     Ankle eversion    (Blank rows = not tested)   TRANSFERS: Assistive device utilized: Single point cane  Sit to stand: Modified independence Stand to sit: Modified independence Chair to chair: Modified independence Floor:  Not tested   GAIT: Gait pattern: decreased arm swing- Right, decreased arm swing- Left, decreased step length- Right, decreased step length- Left, decreased stance time- Right, decreased stance time- Left, and decreased stride length Distance walked: > 200 feet in total Assistive device utilized: Single point cane Level of assistance: CGA Comments: decreased gait speed and VC to pick up feet  FUNCTIONAL TESTS:  5 times sit to stand: 22.68 sec withuout  Timed up and go (TUG): 13.99 sec with cane 6 minute walk test: To be performed next visit 10 meter walk test: 0.59 m/s Berg Balance Scale: 45/56  PATIENT SURVEYS:  FOTO 54  TODAY'S TREATMENT:  DATE: 02/04/23   BP: 150/96 ; standing 136/99 After 6 min walk test: 160/116; after 2 minutes 160/92   6 min walk test: 715 ft with rollator Seated exercise education: -march 10x each LE -LAQ 10x each LE 3 second holds -BTB abduction 10x; education on tying/untying    PATIENT EDUCATION: Education details: PT plan of care; purpose of outcome measures Person educated: Patient Education method: Explanation Education comprehension: verbalized understanding  HOME EXERCISE PROGRAM: Access Code: [email or web address] URL: [email or web address] Date: 02/04/2023 Prepared by: Precious Bard  Exercises - Seated March  - 1 x daily - 7 x weekly - 2 sets - 10 reps - 5 hold - Leg Extension  - 1 x daily - 7 x weekly - 2 sets - 10 reps - 3 hold - Seated Hip Abduction with Resistance  - 1 x daily - 7 x weekly - 2 sets - 10 reps - 5 hold  GOALS: Goals reviewed with patient? Yes  SHORT TERM GOALS:  Target date: 03/13/2023  Pt will be independent with HEP in order to improve strength and balance in order to decrease fall risk and improve function at home and work.  Baseline: EVAL: No formal HEP in place Goal status: INITIAL    LONG TERM GOALS: Target date: 04/24/2023  1.  Patient (> 64 years old) will complete five times sit to stand test in < 15 seconds indicating an increased LE strength and improved balance. Baseline: EVAL: 22.68 without UE support Goal status: INITIAL  2.  Patient will increase FOTO score to equal to or greater than  55   to demonstrate statistically significant improvement in mobility and quality of life.  Baseline: EVAL=54 Goal status: INITIAL   3.  Patient will increase Berg Balance score by > 4 points to demonstrate decreased fall risk during functional activities. Baseline: 45 Goal status: INITIAL   4.   Patient will reduce timed up and go to <11 seconds to reduce fall risk and demonstrate improved transfer/gait ability. Baseline: EVAL=13.99 sec with cane Goal status: INITIAL  5.   Patient will increase 10 meter walk test to >1.58m/s as to improve gait speed for better community ambulation and to reduce fall risk. Baseline: EVAL= 0.58 m/s Goal status: INITIAL  6.   Patient will increase six minute walk test distance to >1000 for progression to community ambulator and improve gait ability Baseline: EVAL: to be assessed 2nd visit 10/29: 715 ft  Goal status: INITIAL   ASSESSMENT:  CLINICAL IMPRESSION: Patient performed six minute walk test with increased diastolic BP requiring prolonged rest break to return to therapeutics range. Home exercise program created and patient demonstrated understanding with son present as well. He will benefit from skilled PT services to improve his functional strength/mobility and overall balance for improved quality of life and decreased risk of falling.   OBJECTIVE IMPAIRMENTS: Abnormal gait, decreased activity  tolerance, decreased balance, decreased cognition, decreased coordination, decreased endurance, decreased mobility, difficulty walking, decreased strength, and impaired flexibility.   ACTIVITY LIMITATIONS: carrying, lifting, bending, sitting, standing, squatting, stairs, and transfers  PARTICIPATION LIMITATIONS: cleaning, laundry, shopping, community activity, and yard work  PERSONAL FACTORS: Age and 1-2 comorbidities: COPD, HTN  are also affecting patient's functional outcome.   REHAB POTENTIAL: Good  CLINICAL DECISION MAKING: Evolving/moderate complexity  EVALUATION COMPLEXITY: Moderate  PLAN:  PT FREQUENCY: 1-2x/week  PT DURATION: 12 weeks  PLANNED INTERVENTIONS: 97164- PT Re-evaluation, 97110-Therapeutic exercises, 97530- Therapeutic activity, 97112- Neuromuscular re-education, 97535- Self Care, 52841- Manual therapy, [id number]- Gait training,  81191- Orthotic Fit/training, 47829- Canalith repositioning, 56213- Electrical stimulation (manual), Patient/Family education, Balance training, Stair training, Taping, Dry Needling, Joint mobilization, Spinal mobilization, Vestibular training, Visual/preceptual remediation/compensation, DME instructions, Cryotherapy, and Moist heat  PLAN FOR NEXT SESSION:  balance and therex    Precious Bard, PT 02/04/2023, 1:18 PM

## 2023-02-04 ENCOUNTER — Ambulatory Visit: Payer: No Typology Code available for payment source

## 2023-02-04 DIAGNOSIS — R2689 Other abnormalities of gait and mobility: Secondary | ICD-10-CM

## 2023-02-04 DIAGNOSIS — R2681 Unsteadiness on feet: Secondary | ICD-10-CM | POA: Diagnosis not present

## 2023-02-04 DIAGNOSIS — R269 Unspecified abnormalities of gait and mobility: Secondary | ICD-10-CM

## 2023-02-04 DIAGNOSIS — R262 Difficulty in walking, not elsewhere classified: Secondary | ICD-10-CM

## 2023-02-04 NOTE — Therapy (Incomplete)
OUTPATIENT PHYSICAL THERAPY NEURO TREATMENT   Patient Name: Craig Gilmore MRN: [id number] DOB:05-07-1934, 88 y.o., male Today's Date: 02/04/2023   PCP: Dr. Jerl Mina   REFERRING PROVIDER: Dr. Jerl Mina  END OF SESSION:     Past Medical History:  Diagnosis Date   Chronic atrial fibrillation [facility])    a.) CHA2DS2VASc = 3 (age x 2, HTN);  b.) s/p DCCV 08/20/2004 --> 50 J x 1 and 100 J x 1 --> coverted with SB. (+) post-procedure nausea; c.) rate/rhythm maintained without pharmacological intervention; chronically anticoagulated with warfarin   Colonic polyp    COPD (chronic obstructive pulmonary disease) (HCC)    Depression    Dyspnea    Edema    GERD (gastroesophageal reflux disease)    Gout    Hemorrhoids    Hyperlipidemia    Hypertension    Insomnia    a.) on hypnotic (zolpidem)   Long term current use of anticoagulant    a.) warfarin   OSA on CPAP    Pneumonia    Pulmonary HTN (HCC) 02/14/2016   a.) TTE 02/14/2016: RVSP 37.5 mmHg   Sick sinus syndrome (HCC)    Squamous cell carcinoma in situ 10/03/2009   L medial infrapectoral   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   L nose supratip   Stage 3b chronic kidney disease (CKD) (HCC)    Past Surgical History:  Procedure Laterality Date   CARDIOVERSION N/A 08/21/2004   Procedure: CIRECT CURRENT CARDIOVERSION; Location: [facility]; Surgeon: Harold Hedge, MD   CATARACT EXTRACTION, BILATERAL     CHOLECYSTECTOMY     COLONOSCOPY     NASAL SINUS SURGERY     Patient Active Problem List   Diagnosis Date Noted   Hyponatremia 12/19/2022   Sick sinus syndrome (HCC) 12/16/2022   Interstitial lung disease (HCC) 12/16/2022   Thoracic aortic aneurysm (HCC) 12/16/2022   Sepsis due to pneumonia (HCC) 12/16/2022   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 tophus 02/07/2022   Stage 3b chronic kidney disease (HCC) 02/07/2022   Hyperlipidemia 05/04/2015   Encounter for therapeutic drug monitoring 11/24/2013   Exertional  dyspnea 11/23/2013   Chronic atrial fibrillation (HCC)    Pneumonia, organism unspecified(486) 08/17/2012    ONSET DATE: 2-3  REFERRING DIAG:  R26.81 (ICD-10-CM) - Unsteadiness on feet  R68.89 (ICD-10-CM) - Other general symptoms and signs  Z87.01 (ICD-10-CM) - Personal history of pneumonia (recurrent)    THERAPY DIAG:  No diagnosis found.  Rationale for Evaluation and Treatment: Rehabilitation  SUBJECTIVE:  SUBJECTIVE STATEMENT: ***   Pt accompanied by:  wife- [name] and son  PERTINENT HISTORY: Patient is a 88 year old male with referall from PCP for imbalance and past medical history includes a recent hospitalization- 12/16/2022- 5 days for sepsis and PNA. PMH includes renal lesion, A-Fib, COPD, Depression, HTN, GOUT, Sleep apnea,  and HLD.  Wife reports he has been having balance issues for 2-3 years but having more unsteadiness recently  PAIN:  Are you having pain? No  PRECAUTIONS: Fall  RED FLAGS: None   WEIGHT BEARING RESTRICTIONS: No  FALLS: Has patient fallen in last 6 months?  No falls but several near falls.   LIVING ENVIRONMENT: Lives with: lives with their family- wife (son stays during the day)  Lives in: House/apartment Stairs: Yes: External: 3 steps; on right going up Has following equipment at home: Single point cane, Rollator  PLOF: Requires assistive device for independence and Needs assistance with ADLs  PATIENT GOALS: To improve my balance and not fall  OBJECTIVE:  Note: Objective measures were completed at Evaluation unless otherwise noted.  DIAGNOSTIC FINDINGS:  CT SCAN- 12/16/2022 IMPRESSION: 1. No evidence for aortic dissection or aneurysm. 2. Ascending thoracic aortic aneurysm measuring 4.3 x 3.7 cm. Recommend annual imaging followup by CTA or MRA. This  recommendation follows 2010 ACCF/AHA/AATS/ACR/ASA/SCA/SCAI/SIR/STS/SVM Guidelines for the Diagnosis and Management of Patients with Thoracic Aortic Disease. Circulation. 2010; 121: G956-O130. Aortic aneurysm NOS (ICD10-I71.9) 3. Pathologic mediastinal and right hilar lymphadenopathy of uncertain etiology. 4. Findings compatible with interstitial lung disease. 5. Ground-glass opacity in the right lower lobe measuring 3.3 cm. This may be infectious/inflammatory, but neoplasm can not be excluded. 6. 4.8 cm indeterminate right renal lesion, possibly complex cysts. Recommend further evaluation with dedicated CT or MRI. 7. Bladder diverticulum. 8. Colonic diverticulosis. 9. Prostatomegaly. 10. Small fat containing left inguinal hernia.     Electronically Signed   By: Darliss Cheney M.D.   On: 12/16/2022 23:30  COGNITION: Overall cognitive status: Impaired- forgetful   SENSATION: WFL  COORDINATION: Delayed at times with BLE  EDEMA:  None observed   POSTURE: rounded shoulders and forward head    LOWER EXTREMITY MMT:    MMT Right Eval Left Eval  Hip flexion 4 4  Hip extension    Hip abduction 4 4  Hip adduction 4 4  Hip internal rotation 4 4  Hip external rotation 4 4  Knee flexion 4 4  Knee extension 4 4  Ankle dorsiflexion 4 4  Ankle plantarflexion    Ankle inversion    Ankle eversion    (Blank rows = not tested)   TRANSFERS: Assistive device utilized: Single point cane  Sit to stand: Modified independence Stand to sit: Modified independence Chair to chair: Modified independence Floor:  Not tested   GAIT: Gait pattern: decreased arm swing- Right, decreased arm swing- Left, decreased step length- Right, decreased step length- Left, decreased stance time- Right, decreased stance time- Left, and decreased stride length Distance walked: > 200 feet in total Assistive device utilized: Single point cane Level of assistance: CGA Comments: decreased gait speed  and VC to pick up feet  FUNCTIONAL TESTS:  5 times sit to stand: 22.68 sec withuout  Timed up and go (TUG): 13.99 sec with cane 6 minute walk test: To be performed next visit 10 meter walk test: 0.59 m/s Berg Balance Scale: 45/56  PATIENT SURVEYS:  FOTO 54  TODAY'S TREATMENT:  DATE: 02/04/23   BP: 150/96 ; standing 136/99 After 6 min walk test: 160/116; after 2 minutes 160/92   TherEx: Standing with # *** ankle weight: CGA for stability  -Hip extension with *** upper extremity support, cueing for neutral hip alignment, upright posture for optimal muscle recruitment, and sequencing, 10x each LE,  -Hip abduction with *** upper extremity support, cueing for neutral foot alignment for correct muscle activation, 10x each LE -Hip flexion with *** upper extremity support, cueing for body mechanics, speed of muscle recruitment for optimal strengthening and stabilization 10x each LE -Hamstring curl with *** upper extremity support, cueing for knee alignment for recruitment of hamstring musculature, 10x each LE   Seated with # *** ankle weights  -Seated marches with upright posture, back away from back of chair for abdominal/trunk activation/stabilization, 10x each LE -Seated LAQ with 3 second holds, 10x each LE, cueing for muscle activation and sequencing for neutral alignment -Seated IR/ER with cueing for stabilizing knee placement with lateral foot movement for optimal muscle recruitment, 10x each LE  Neuro RE-ed:  Standing with CGA next to support surface:  Airex pad: static stand 30 seconds x 2 trials, noticeable trembling of ankles/LE's with fatigue and challenge to maintain stability Airex pad: horizontal head turns 30 seconds scanning room 10x ; cueing for arc of motion  Airex pad: vertical head turns 30 seconds, cueing for arc of motion, noticeable sway with  upward gaze increasing demand on ankle righting reaction musculature Airex pad: one foot on 6" step one foot on airex pad, hold position for 30 seconds, switch legs, 2x each LE;    PATIENT EDUCATION: Education details: PT plan of care; purpose of outcome measures Person educated: Patient Education method: Explanation Education comprehension: verbalized understanding  HOME EXERCISE PROGRAM: Access Code: [email or web address] URL: [email or web address] Date: 02/04/2023 Prepared by: Precious Bard  Exercises - Seated March  - 1 x daily - 7 x weekly - 2 sets - 10 reps - 5 hold - Leg Extension  - 1 x daily - 7 x weekly - 2 sets - 10 reps - 3 hold - Seated Hip Abduction with Resistance  - 1 x daily - 7 x weekly - 2 sets - 10 reps - 5 hold  GOALS: Goals reviewed with patient? Yes  SHORT TERM GOALS: Target date: 03/13/2023  Pt will be independent with HEP in order to improve strength and balance in order to decrease fall risk and improve function at home and work.  Baseline: EVAL: No formal HEP in place Goal status: INITIAL    LONG TERM GOALS: Target date: 04/24/2023  1.  Patient (> 66 years old) will complete five times sit to stand test in < 15 seconds indicating an increased LE strength and improved balance. Baseline: EVAL: 22.68 without UE support Goal status: INITIAL  2.  Patient will increase FOTO score to equal to or greater than  55   to demonstrate statistically significant improvement in mobility and quality of life.  Baseline: EVAL=54 Goal status: INITIAL   3.  Patient will increase Berg Balance score by > 4 points to demonstrate decreased fall risk during functional activities. Baseline: 45 Goal status: INITIAL   4.   Patient will reduce timed up and go to <11 seconds to reduce fall risk and demonstrate improved transfer/gait ability. Baseline: EVAL=13.99 sec with cane Goal status: INITIAL  5.   Patient will increase 10 meter walk test to >1.80m/s as to improve  gait speed for better community ambulation and  to reduce fall risk. Baseline: EVAL= 0.58 m/s Goal status: INITIAL  6.   Patient will increase six minute walk test distance to >1000 for progression to community ambulator and improve gait ability Baseline: EVAL: to be assessed 2nd visit 10/29: 715 ft  Goal status: INITIAL   ASSESSMENT:  CLINICAL IMPRESSION: ***. He will benefit from skilled PT services to improve his functional strength/mobility and overall balance for improved quality of life and decreased risk of falling.   OBJECTIVE IMPAIRMENTS: Abnormal gait, decreased activity tolerance, decreased balance, decreased cognition, decreased coordination, decreased endurance, decreased mobility, difficulty walking, decreased strength, and impaired flexibility.   ACTIVITY LIMITATIONS: carrying, lifting, bending, sitting, standing, squatting, stairs, and transfers  PARTICIPATION LIMITATIONS: cleaning, laundry, shopping, community activity, and yard work  PERSONAL FACTORS: Age and 1-2 comorbidities: COPD, HTN  are also affecting patient's functional outcome.   REHAB POTENTIAL: Good  CLINICAL DECISION MAKING: Evolving/moderate complexity  EVALUATION COMPLEXITY: Moderate  PLAN:  PT FREQUENCY: 1-2x/week  PT DURATION: 12 weeks  PLANNED INTERVENTIONS: 97164- PT Re-evaluation, 97110-Therapeutic exercises, 97530- Therapeutic activity, 97112- Neuromuscular re-education, 97535- Self Care, 40981- Manual therapy, [phone]- Gait training, [phone]- Orthotic Fit/training, [phone]- Canalith repositioning, [phone]- Electrical stimulation (manual), Patient/Family education, Balance training, Stair training, Taping, Dry Needling, Joint mobilization, Spinal mobilization, Vestibular training, Visual/preceptual remediation/compensation, DME instructions, Cryotherapy, and Moist heat  PLAN FOR NEXT SESSION:  balance and therex    Precious Bard, PT 02/04/2023, 5:34 PM

## 2023-02-06 ENCOUNTER — Ambulatory Visit: Payer: No Typology Code available for payment source

## 2023-02-06 DIAGNOSIS — R262 Difficulty in walking, not elsewhere classified: Secondary | ICD-10-CM

## 2023-02-06 DIAGNOSIS — M6281 Muscle weakness (generalized): Secondary | ICD-10-CM

## 2023-02-06 DIAGNOSIS — R2689 Other abnormalities of gait and mobility: Secondary | ICD-10-CM

## 2023-02-06 DIAGNOSIS — R278 Other lack of coordination: Secondary | ICD-10-CM

## 2023-02-06 DIAGNOSIS — R2681 Unsteadiness on feet: Secondary | ICD-10-CM

## 2023-02-06 DIAGNOSIS — R269 Unspecified abnormalities of gait and mobility: Secondary | ICD-10-CM

## 2023-02-06 NOTE — Therapy (Signed)
OUTPATIENT PHYSICAL THERAPY NEURO TREATMENT   Patient Name: Craig Gilmore MRN: [id number] DOB:[birth year], 88 y.o., male Today's Date: 02/06/2023   PCP: Dr. Jerl Mina   REFERRING PROVIDER: Dr. Jerl Mina  END OF SESSION:  PT End of Session - 02/06/23 1728     Visit Number 3    Number of Visits 24    Date for PT Re-Evaluation 04/24/23    Progress Note Due on Visit 10    PT Start Time 1533    PT Stop Time 1615    PT Time Calculation (min) 42 min    Equipment Utilized During Treatment Gait belt    Activity Tolerance Patient tolerated treatment well    Behavior During Therapy WFL for tasks assessed/performed              Past Medical History:  Diagnosis Date   Chronic atrial fibrillation (HCC)    a.) CHA2DS2VASc = 3 (age x 2, HTN);  b.) s/p DCCV 08/20/2004 --> 50 J x 1 and 100 J x 1 --> coverted with SB. (+) post-procedure nausea; c.) rate/rhythm maintained without pharmacological intervention; chronically anticoagulated with warfarin   Colonic polyp    COPD (chronic obstructive pulmonary disease) (HCC)    Depression    Dyspnea    Edema    GERD (gastroesophageal reflux disease)    Gout    Hemorrhoids    Hyperlipidemia    Hypertension    Insomnia    a.) on hypnotic (zolpidem)   Long term current use of anticoagulant    a.) warfarin   OSA on CPAP    Pneumonia    Pulmonary HTN (HCC) 02/14/2016   a.) TTE 02/14/2016: RVSP 37.5 mmHg   Sick sinus syndrome (HCC)    Squamous cell carcinoma in situ 10/03/2009   L medial infrapectoral   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   L nose supratip   Stage 3b chronic kidney disease (CKD) (HCC)    Past Surgical History:  Procedure Laterality Date   CARDIOVERSION N/A 08/21/2004   Procedure: CIRECT CURRENT CARDIOVERSION; Location: [facility]; Surgeon: Harold Hedge, MD   CATARACT EXTRACTION, BILATERAL     CHOLECYSTECTOMY     COLONOSCOPY     NASAL SINUS SURGERY     Patient Active Problem List   Diagnosis Date Noted    Hyponatremia 12/19/2022   Sick sinus syndrome (HCC) 12/16/2022   Interstitial lung disease (HCC) 12/16/2022   Thoracic aortic aneurysm (HCC) 12/16/2022   Sepsis due to pneumonia (HCC) 12/16/2022   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 tophus 02/07/2022   Stage 3b chronic kidney disease (HCC) 02/07/2022   Hyperlipidemia 05/04/2015   Encounter for therapeutic drug monitoring 11/24/2013   Exertional dyspnea 11/23/2013   Chronic atrial fibrillation (HCC)    Pneumonia, organism unspecified(486) 08/17/2012    ONSET DATE: 2-3  REFERRING DIAG:  R26.81 (ICD-10-CM) - Unsteadiness on feet  R68.89 (ICD-10-CM) - Other general symptoms and signs  Z87.01 (ICD-10-CM) - Personal history of pneumonia (recurrent)    THERAPY DIAG:  Unsteadiness on feet  Other abnormalities of gait and mobility  Abnormality of gait and mobility  Difficulty in walking, not elsewhere classified  Other lack of coordination  Muscle weakness (generalized)  Rationale for Evaluation and Treatment: Rehabilitation  SUBJECTIVE:  SUBJECTIVE STATEMENT: Patient's wife reports patient now has gout flare up today.    Pt accompanied by:  wife- [name] and son  PERTINENT HISTORY: Patient is a 88 year old male with referall from PCP for imbalance and past medical history includes a recent hospitalization- 12/16/2022- 5 days for sepsis and PNA. PMH includes renal lesion, A-Fib, COPD, Depression, HTN, GOUT, Sleep apnea,  and HLD.  Wife reports he has been having balance issues for 2-3 years but having more unsteadiness recently  PAIN:  Are you having pain? No  PRECAUTIONS: Fall  RED FLAGS: None   WEIGHT BEARING RESTRICTIONS: No  FALLS: Has patient fallen in last 6 months?  No falls but several near falls.   LIVING  ENVIRONMENT: Lives with: lives with their family- wife (son stays during the day)  Lives in: House/apartment Stairs: Yes: External: 3 steps; on right going up Has following equipment at home: Single point cane, Rollator  PLOF: Requires assistive device for independence and Needs assistance with ADLs  PATIENT GOALS: To improve my balance and not fall  OBJECTIVE:  Note: Objective measures were completed at Evaluation unless otherwise noted.  DIAGNOSTIC FINDINGS:  CT SCAN- 12/16/2022 IMPRESSION: 1. No evidence for aortic dissection or aneurysm. 2. Ascending thoracic aortic aneurysm measuring 4.3 x 3.7 cm. Recommend annual imaging followup by CTA or MRA. This recommendation follows 2010 ACCF/AHA/AATS/ACR/ASA/SCA/SCAI/SIR/STS/SVM Guidelines for the Diagnosis and Management of Patients with Thoracic Aortic Disease. Circulation. 2010; 121: C166-A630. Aortic aneurysm NOS (ICD10-I71.9) 3. Pathologic mediastinal and right hilar lymphadenopathy of uncertain etiology. 4. Findings compatible with interstitial lung disease. 5. Ground-glass opacity in the right lower lobe measuring 3.3 cm. This may be infectious/inflammatory, but neoplasm can not be excluded. 6. 4.8 cm indeterminate right renal lesion, possibly complex cysts. Recommend further evaluation with dedicated CT or MRI. 7. Bladder diverticulum. 8. Colonic diverticulosis. 9. Prostatomegaly. 10. Small fat containing left inguinal hernia.     Electronically Signed   By: Darliss Cheney M.D.   On: 12/16/2022 23:30  COGNITION: Overall cognitive status: Impaired- forgetful   SENSATION: WFL  COORDINATION: Delayed at times with BLE  EDEMA:  None observed   POSTURE: rounded shoulders and forward head    LOWER EXTREMITY MMT:    MMT Right Eval Left Eval  Hip flexion 4 4  Hip extension    Hip abduction 4 4  Hip adduction 4 4  Hip internal rotation 4 4  Hip external rotation 4 4  Knee flexion 4 4  Knee extension 4 4   Ankle dorsiflexion 4 4  Ankle plantarflexion    Ankle inversion    Ankle eversion    (Blank rows = not tested)   TRANSFERS: Assistive device utilized: Single point cane  Sit to stand: Modified independence Stand to sit: Modified independence Chair to chair: Modified independence Floor:  Not tested   GAIT: Gait pattern: decreased arm swing- Right, decreased arm swing- Left, decreased step length- Right, decreased step length- Left, decreased stance time- Right, decreased stance time- Left, and decreased stride length Distance walked: > 200 feet in total Assistive device utilized: Single point cane Level of assistance: CGA Comments: decreased gait speed and VC to pick up feet  FUNCTIONAL TESTS:  5 times sit to stand: 22.68 sec withuout  Timed up and go (TUG): 13.99 sec with cane 6 minute walk test: To be performed next visit 10 meter walk test: 0.59 m/s Berg Balance Scale: 45/56  PATIENT SURVEYS:  FOTO 54  TODAY'S TREATMENT:  DATE: 02/06/23   BP: 142/78 mmHg   Seated exercise education: TO avoid left foot weight bearing secondary to symptoms of gout.   -march 2 x10x each LE -LAQ  2x10 each LE 3 second holds -BTB abduction 2 x 10 -Hip adduction with ball squeeze 2 x 10 reps -Toe raises 2 x 10 reps - Ham curls - BTB 2 x 10 reps -Lateral step over 1/2 spike ball- 2 x 10 reps    PATIENT EDUCATION: Education details: PT plan of care; purpose of outcome measures Person educated: Patient Education method: Explanation Education comprehension: verbalized understanding  HOME EXERCISE PROGRAM: Access Code: [email or web address] URL: [email or web address] Date: 02/04/2023 Prepared by: Precious Bard  Exercises - Seated March  - 1 x daily - 7 x weekly - 2 sets - 10 reps - 5 hold - Leg Extension  - 1 x daily - 7 x weekly - 2 sets - 10 reps - 3  hold - Seated Hip Abduction with Resistance  - 1 x daily - 7 x weekly - 2 sets - 10 reps - 5 hold  GOALS: Goals reviewed with patient? Yes  SHORT TERM GOALS: Target date: 03/13/2023  Pt will be independent with HEP in order to improve strength and balance in order to decrease fall risk and improve function at home and work.  Baseline: EVAL: No formal HEP in place Goal status: INITIAL    LONG TERM GOALS: Target date: 04/24/2023  1.  Patient (> 37 years old) will complete five times sit to stand test in < 15 seconds indicating an increased LE strength and improved balance. Baseline: EVAL: 22.68 without UE support Goal status: INITIAL  2.  Patient will increase FOTO score to equal to or greater than  55   to demonstrate statistically significant improvement in mobility and quality of life.  Baseline: EVAL=54 Goal status: INITIAL   3.  Patient will increase Berg Balance score by > 4 points to demonstrate decreased fall risk during functional activities. Baseline: 45 Goal status: INITIAL   4.   Patient will reduce timed up and go to <11 seconds to reduce fall risk and demonstrate improved transfer/gait ability. Baseline: EVAL=13.99 sec with cane Goal status: INITIAL  5.   Patient will increase 10 meter walk test to >1.28m/s as to improve gait speed for better community ambulation and to reduce fall risk. Baseline: EVAL= 0.58 m/s Goal status: INITIAL  6.   Patient will increase six minute walk test distance to >1000 for progression to community ambulator and improve gait ability Baseline: EVAL: to be assessed 2nd visit 10/29: 715 ft  Goal status: INITIAL   ASSESSMENT:  CLINICAL IMPRESSION: Treatment limited to sitting (non-weightbearing activities) secondary to reporting gout flare up. He responded well overall to all therex - VC for safety yet no significant difficulty and no reported pain. He will benefit from skilled PT services to improve his functional strength/mobility and  overall balance for improved quality of life and decreased risk of falling.   OBJECTIVE IMPAIRMENTS: Abnormal gait, decreased activity tolerance, decreased balance, decreased cognition, decreased coordination, decreased endurance, decreased mobility, difficulty walking, decreased strength, and impaired flexibility.   ACTIVITY LIMITATIONS: carrying, lifting, bending, sitting, standing, squatting, stairs, and transfers  PARTICIPATION LIMITATIONS: cleaning, laundry, shopping, community activity, and yard work  PERSONAL FACTORS: Age and 1-2 comorbidities: COPD, HTN  are also affecting patient's functional outcome.   REHAB POTENTIAL: Good  CLINICAL DECISION MAKING: Evolving/moderate complexity  EVALUATION COMPLEXITY: Moderate  PLAN:  PT FREQUENCY: 1-2x/week  PT DURATION: 12 weeks  PLANNED INTERVENTIONS: 97164- PT Re-evaluation, 97110-Therapeutic exercises, 97530- Therapeutic activity, 97112- Neuromuscular re-education, [phone]- Self Care, 10272- Manual therapy, [phone]- Gait training, [phone]- Orthotic Fit/training, [phone]- Canalith repositioning, [phone]- Electrical stimulation (manual), Patient/Family education, Balance training, Stair training, Taping, Dry Needling, Joint mobilization, Spinal mobilization, Vestibular training, Visual/preceptual remediation/compensation, DME instructions, Cryotherapy, and Moist heat  PLAN FOR NEXT SESSION:  balance and therex    Lenda Kelp, PT 02/06/2023, 5:31 PM

## 2023-02-07 ENCOUNTER — Other Ambulatory Visit: Payer: Self-pay | Admitting: Physician Assistant

## 2023-02-07 DIAGNOSIS — R339 Retention of urine, unspecified: Secondary | ICD-10-CM

## 2023-02-07 LAB — CULTURE, URINE COMPREHENSIVE

## 2023-02-07 MED ORDER — CIPROFLOXACIN HCL 250 MG PO TABS
250.0000 mg | ORAL_TABLET | Freq: Two times a day (BID) | ORAL | 0 refills | Status: DC
Start: 2023-02-07 — End: 2023-02-14

## 2023-02-07 NOTE — Addendum Note (Signed)
Addended by: Debarah Crape on: 02/07/2023 04:36 PM   Modules accepted: Orders

## 2023-02-08 DIAGNOSIS — G4733 Obstructive sleep apnea (adult) (pediatric): Secondary | ICD-10-CM | POA: Diagnosis not present

## 2023-02-08 DIAGNOSIS — I10 Essential (primary) hypertension: Secondary | ICD-10-CM | POA: Diagnosis not present

## 2023-02-10 ENCOUNTER — Other Ambulatory Visit: Payer: Self-pay | Admitting: *Deleted

## 2023-02-10 ENCOUNTER — Other Ambulatory Visit: Payer: Self-pay | Admitting: Physician Assistant

## 2023-02-10 DIAGNOSIS — R339 Retention of urine, unspecified: Secondary | ICD-10-CM

## 2023-02-10 MED ORDER — CIPROFLOXACIN HCL 250 MG PO TABS
250.0000 mg | ORAL_TABLET | Freq: Two times a day (BID) | ORAL | 0 refills | Status: AC
Start: 2023-02-10 — End: 2023-02-17

## 2023-02-10 NOTE — Therapy (Signed)
OUTPATIENT PHYSICAL THERAPY NEURO TREATMENT   Patient Name: Craig Gilmore MRN: [id number] DOB:[birth year], 88 y.o., male Today's Date: 02/11/2023   PCP: Dr. Jerl Mina   REFERRING PROVIDER: Dr. Jerl Mina  END OF SESSION:  PT End of Session - 02/11/23 1313     Visit Number 4    Number of Visits 24    Date for PT Re-Evaluation 04/24/23    Progress Note Due on Visit 10    PT Start Time 1514    PT Stop Time 1559    PT Time Calculation (min) 45 min    Equipment Utilized During Treatment Gait belt    Activity Tolerance Patient tolerated treatment well    Behavior During Therapy WFL for tasks assessed/performed               Past Medical History:  Diagnosis Date   Chronic atrial fibrillation (HCC)    a.) CHA2DS2VASc = 3 (age x 2, HTN);  b.) s/p DCCV 08/20/2004 --> 50 J x 1 and 100 J x 1 --> coverted with SB. (+) post-procedure nausea; c.) rate/rhythm maintained without pharmacological intervention; chronically anticoagulated with warfarin   Colonic polyp    COPD (chronic obstructive pulmonary disease) (HCC)    Depression    Dyspnea    Edema    GERD (gastroesophageal reflux disease)    Gout    Hemorrhoids    Hyperlipidemia    Hypertension    Insomnia    a.) on hypnotic (zolpidem)   Long term current use of anticoagulant    a.) warfarin   OSA on CPAP    Pneumonia    Pulmonary HTN (HCC) 02/14/2016   a.) TTE 02/14/2016: RVSP 37.5 mmHg   Sick sinus syndrome (HCC)    Squamous cell carcinoma in situ 10/03/2009   L medial infrapectoral   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   L nose supratip   Stage 3b chronic kidney disease (CKD) (HCC)    Past Surgical History:  Procedure Laterality Date   CARDIOVERSION N/A 08/21/2004   Procedure: CIRECT CURRENT CARDIOVERSION; Location: [facility]; Surgeon: Harold Hedge, MD   CATARACT EXTRACTION, BILATERAL     CHOLECYSTECTOMY     COLONOSCOPY     NASAL SINUS SURGERY     Patient Active Problem List   Diagnosis Date Noted    Hyponatremia 12/19/2022   Sick sinus syndrome (HCC) 12/16/2022   Interstitial lung disease (HCC) 12/16/2022   Thoracic aortic aneurysm (HCC) 12/16/2022   Sepsis due to pneumonia (HCC) 12/16/2022   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 tophus 02/07/2022   Stage 3b chronic kidney disease (HCC) 02/07/2022   Hyperlipidemia 05/04/2015   Encounter for therapeutic drug monitoring 11/24/2013   Exertional dyspnea 11/23/2013   Chronic atrial fibrillation (HCC)    Pneumonia, organism unspecified(486) 08/17/2012    ONSET DATE: 2-3  REFERRING DIAG:  R26.81 (ICD-10-CM) - Unsteadiness on feet  R68.89 (ICD-10-CM) - Other general symptoms and signs  Z87.01 (ICD-10-CM) - Personal history of pneumonia (recurrent)    THERAPY DIAG:  Unsteadiness on feet  Other abnormalities of gait and mobility  Abnormality of gait and mobility  Difficulty in walking, not elsewhere classified  Rationale for Evaluation and Treatment: Rehabilitation  SUBJECTIVE:  SUBJECTIVE STATEMENT: Patient is having 10/10 pain when having to urinate, upon urination pain is decreased.     Pt accompanied by:  wife- [name] and son  PERTINENT HISTORY: Patient is a 88 year old male with referall from PCP for imbalance and past medical history includes a recent hospitalization- 12/16/2022- 5 days for sepsis and PNA. PMH includes renal lesion, A-Fib, COPD, Depression, HTN, GOUT, Sleep apnea,  and HLD.  Wife reports he has been having balance issues for 2-3 years but having more unsteadiness recently  PAIN:  Are you having pain? No  PRECAUTIONS: Fall  RED FLAGS: None   WEIGHT BEARING RESTRICTIONS: No  FALLS: Has patient fallen in last 6 months?  No falls but several near falls.   LIVING ENVIRONMENT: Lives with: lives with their family-  wife (son stays during the day)  Lives in: House/apartment Stairs: Yes: External: 3 steps; on right going up Has following equipment at home: Single point cane, Rollator  PLOF: Requires assistive device for independence and Needs assistance with ADLs  PATIENT GOALS: To improve my balance and not fall  OBJECTIVE:  Note: Objective measures were completed at Evaluation unless otherwise noted.  DIAGNOSTIC FINDINGS:  CT SCAN- 12/16/2022 IMPRESSION: 1. No evidence for aortic dissection or aneurysm. 2. Ascending thoracic aortic aneurysm measuring 4.3 x 3.7 cm. Recommend annual imaging followup by CTA or MRA. This recommendation follows 2010 ACCF/AHA/AATS/ACR/ASA/SCA/SCAI/SIR/STS/SVM Guidelines for the Diagnosis and Management of Patients with Thoracic Aortic Disease. Circulation. 2010; 121: Z610-R604. Aortic aneurysm NOS (ICD10-I71.9) 3. Pathologic mediastinal and right hilar lymphadenopathy of uncertain etiology. 4. Findings compatible with interstitial lung disease. 5. Ground-glass opacity in the right lower lobe measuring 3.3 cm. This may be infectious/inflammatory, but neoplasm can not be excluded. 6. 4.8 cm indeterminate right renal lesion, possibly complex cysts. Recommend further evaluation with dedicated CT or MRI. 7. Bladder diverticulum. 8. Colonic diverticulosis. 9. Prostatomegaly. 10. Small fat containing left inguinal hernia.     Electronically Signed   By: Darliss Cheney M.D.   On: 12/16/2022 23:30  COGNITION: Overall cognitive status: Impaired- forgetful   SENSATION: WFL  COORDINATION: Delayed at times with BLE  EDEMA:  None observed   POSTURE: rounded shoulders and forward head    LOWER EXTREMITY MMT:    MMT Right Eval Left Eval  Hip flexion 4 4  Hip extension    Hip abduction 4 4  Hip adduction 4 4  Hip internal rotation 4 4  Hip external rotation 4 4  Knee flexion 4 4  Knee extension 4 4  Ankle dorsiflexion 4 4  Ankle plantarflexion     Ankle inversion    Ankle eversion    (Blank rows = not tested)   TRANSFERS: Assistive device utilized: Single point cane  Sit to stand: Modified independence Stand to sit: Modified independence Chair to chair: Modified independence Floor:  Not tested   GAIT: Gait pattern: decreased arm swing- Right, decreased arm swing- Left, decreased step length- Right, decreased step length- Left, decreased stance time- Right, decreased stance time- Left, and decreased stride length Distance walked: > 200 feet in total Assistive device utilized: Single point cane Level of assistance: CGA Comments: decreased gait speed and VC to pick up feet  FUNCTIONAL TESTS:  5 times sit to stand: 22.68 sec withuout  Timed up and go (TUG): 13.99 sec with cane 6 minute walk test: To be performed next visit 10 meter walk test: 0.59 m/s Berg Balance Scale: 45/56  PATIENT SURVEYS:  FOTO 54  TODAY'S TREATMENT:                                                                                                                              DATE: 02/11/23   BP: 142/78 mmHg  TherEx: Sit to stand 5x; 2 sets Ambulate 160 ft.  Neuro Re-ed:  Standing with CGA next to support surface:  Airex pad: static stand 30 seconds x 2 trials, noticeable trembling of ankles/LE's with fatigue and challenge to maintain stability Airex pad: horizontal head turns 30 seconds scanning room 10x ; cueing for arc of motion  Airex pad: vertical head turns 30 seconds, cueing for arc of motion, noticeable sway with upward gaze increasing demand on ankle righting reaction musculature Airex pad: one foot on 6" step one foot on airex pad, hold position for 30 seconds, switch legs, 2x each LE; Airex pad 6" step toe taps 10x each LE Airex pad dual task sort letters into alphabetical order x7 minutes     PATIENT EDUCATION: Education details: PT plan of care; purpose of outcome measures Person educated: Patient Education method:  Explanation Education comprehension: verbalized understanding  HOME EXERCISE PROGRAM: Access Code: [email or web address] URL: [email or web address] Date: 02/04/2023 Prepared by: Precious Bard  Exercises - Seated March  - 1 x daily - 7 x weekly - 2 sets - 10 reps - 5 hold - Leg Extension  - 1 x daily - 7 x weekly - 2 sets - 10 reps - 3 hold - Seated Hip Abduction with Resistance  - 1 x daily - 7 x weekly - 2 sets - 10 reps - 5 hold  GOALS: Goals reviewed with patient? Yes  SHORT TERM GOALS: Target date: 03/13/2023  Pt will be independent with HEP in order to improve strength and balance in order to decrease fall risk and improve function at home and work.  Baseline: EVAL: No formal HEP in place Goal status: INITIAL    LONG TERM GOALS: Target date: 04/24/2023  1.  Patient (> 69 years old) will complete five times sit to stand test in < 15 seconds indicating an increased LE strength and improved balance. Baseline: EVAL: 22.68 without UE support Goal status: INITIAL  2.  Patient will increase FOTO score to equal to or greater than  55   to demonstrate statistically significant improvement in mobility and quality of life.  Baseline: EVAL=54 Goal status: INITIAL   3.  Patient will increase Berg Balance score by > 4 points to demonstrate decreased fall risk during functional activities. Baseline: 45 Goal status: INITIAL   4.   Patient will reduce timed up and go to <11 seconds to reduce fall risk and demonstrate improved transfer/gait ability. Baseline: EVAL=13.99 sec with cane Goal status: INITIAL  5.   Patient will increase 10 meter walk test to >1.63m/s as to improve gait speed for better community ambulation and to reduce fall risk. Baseline: EVAL= 0.58 m/s Goal status: INITIAL  6.  Patient will increase six minute walk test distance to >1000 for progression to community ambulator and improve gait ability Baseline: EVAL: to be assessed 2nd visit 10/29: 715 ft  Goal  status: INITIAL   ASSESSMENT:  CLINICAL IMPRESSION:  Patient's urologist contacted and scheduled appointment tomorrow due to 10/10 pain in abdomen when urge occurs. Pain resolves after using bathroom, unable to use catheter alone, only able to tolerate when in restroom with visual cue. He tolerates static balance as sitting is more painful than standing. He will benefit from skilled PT services to improve his functional strength/mobility and overall balance for improved quality of life and decreased risk of falling.   OBJECTIVE IMPAIRMENTS: Abnormal gait, decreased activity tolerance, decreased balance, decreased cognition, decreased coordination, decreased endurance, decreased mobility, difficulty walking, decreased strength, and impaired flexibility.   ACTIVITY LIMITATIONS: carrying, lifting, bending, sitting, standing, squatting, stairs, and transfers  PARTICIPATION LIMITATIONS: cleaning, laundry, shopping, community activity, and yard work  PERSONAL FACTORS: Age and 1-2 comorbidities: COPD, HTN  are also affecting patient's functional outcome.   REHAB POTENTIAL: Good  CLINICAL DECISION MAKING: Evolving/moderate complexity  EVALUATION COMPLEXITY: Moderate  PLAN:  PT FREQUENCY: 1-2x/week  PT DURATION: 12 weeks  PLANNED INTERVENTIONS: 97164- PT Re-evaluation, 97110-Therapeutic exercises, 97530- Therapeutic activity, 97112- Neuromuscular re-education, 97535- Self Care, 16109- Manual therapy, [phone]- Gait training, [phone]- Orthotic Fit/training, [phone]- Canalith repositioning, [phone]- Electrical stimulation (manual), Patient/Family education, Balance training, Stair training, Taping, Dry Needling, Joint mobilization, Spinal mobilization, Vestibular training, Visual/preceptual remediation/compensation, DME instructions, Cryotherapy, and Moist heat  PLAN FOR NEXT SESSION:  balance and therex    Precious Bard, PT 02/11/2023, 2:08 PM

## 2023-02-11 ENCOUNTER — Ambulatory Visit: Payer: No Typology Code available for payment source | Attending: Family Medicine

## 2023-02-11 DIAGNOSIS — M6281 Muscle weakness (generalized): Secondary | ICD-10-CM | POA: Diagnosis not present

## 2023-02-11 DIAGNOSIS — R278 Other lack of coordination: Secondary | ICD-10-CM | POA: Insufficient documentation

## 2023-02-11 DIAGNOSIS — R2689 Other abnormalities of gait and mobility: Secondary | ICD-10-CM | POA: Diagnosis not present

## 2023-02-11 DIAGNOSIS — R262 Difficulty in walking, not elsewhere classified: Secondary | ICD-10-CM

## 2023-02-11 DIAGNOSIS — R2681 Unsteadiness on feet: Secondary | ICD-10-CM | POA: Diagnosis not present

## 2023-02-11 DIAGNOSIS — R269 Unspecified abnormalities of gait and mobility: Secondary | ICD-10-CM

## 2023-02-12 ENCOUNTER — Encounter: Payer: Self-pay | Admitting: Urology

## 2023-02-12 ENCOUNTER — Ambulatory Visit: Payer: No Typology Code available for payment source | Admitting: Urology

## 2023-02-12 VITALS — BP 134/83 | HR 96 | Ht 67.0 in | Wt 200.0 lb

## 2023-02-12 DIAGNOSIS — R339 Retention of urine, unspecified: Secondary | ICD-10-CM

## 2023-02-12 DIAGNOSIS — R338 Other retention of urine: Secondary | ICD-10-CM | POA: Diagnosis not present

## 2023-02-12 NOTE — Progress Notes (Signed)
   02/12/23  CC:  Chief Complaint  Patient presents with   Cysto    HPI: Urinary retention with failed voiding trials.  Difficulty tolerating catheter secondary to bladder spasms.  Prostate volume by CT ~ 55 cc (ellipsoid) and 70 cc (bullet)  Blood pressure 134/83, pulse 96, height 5\' 7"  (1.702 m), weight 200 lb (90.7 kg). NED. A&Ox3.   No respiratory distress   Abd soft, NT, ND Normal phallus with bilateral descended testicles  Cystoscopy Procedure Note  Patient identification was confirmed, informed consent was obtained, and patient was prepped using Betadine solution.  Lidocaine jelly was administered per urethral meatus.     Pre-Procedure: - Inspection reveals a normal caliber urethral meatus.  Procedure: The flexible cystoscope was introduced without difficulty - No urethral strictures/lesions are present. -Prominent lateral lobe enlargement distal prostatic urethra prostate  -Open bladder neck - Bilateral ureteral orifices not identified - No gross lesions.  Suboptimal visualization   Retroflexion shows no intravesical median lobe   Post-Procedure: - Patient tolerated the procedure well  Assessment/ Plan: Urinary retention with failed voiding trials x 2 Based on on cystoscopy and prostate volume he would be a UroLift candidate.  The procedure was discussed.  He would need clearance to hold Coumadin.  He does have significant medical comorbidities and UroLift would be able to be performed under sedation without general anesthesia We also discussed PAE which is performed by interventional radiology and is performed under sedation TURP/HoLEP would have a higher potential complication rate due to his anticoagulation and medical comorbidities He and his wife are interested in pursuing PAE and will replace referral to interventional radiology   Riki Altes, MD

## 2023-02-12 NOTE — Progress Notes (Signed)
Cath Change/ Replacement  Patient is present today for a catheter change due to urinary retention.  10ml of water was removed from the balloon, a 18c foley cath was removed without difficulty.  Patient was cleaned and prepped in a sterile fashion with betadine and 2% lidocaine jelly was instilled into the urethra. A 18c Foley cath was replaced into the bladder, no complications were noted. Urine return was noted 50ml and urine was yellow in color. The balloon was filled with 10ml of sterile water. A leg bag was attached for drainage.  A night bag was also given to the patient and patient was given instruction on how to change from one bag to another. Patient was given proper instruction on catheter care.    Performed by: Ples Specter CMA   Follow up: No follow-ups on file.

## 2023-02-13 ENCOUNTER — Encounter: Payer: Self-pay | Admitting: Urology

## 2023-02-13 ENCOUNTER — Ambulatory Visit: Payer: No Typology Code available for payment source

## 2023-02-14 ENCOUNTER — Other Ambulatory Visit: Payer: Self-pay | Admitting: Urology

## 2023-02-14 DIAGNOSIS — N401 Enlarged prostate with lower urinary tract symptoms: Secondary | ICD-10-CM

## 2023-02-14 DIAGNOSIS — G4733 Obstructive sleep apnea (adult) (pediatric): Secondary | ICD-10-CM | POA: Diagnosis not present

## 2023-02-14 NOTE — Telephone Encounter (Signed)
Patient wife stop by office to pick up informations.

## 2023-02-17 ENCOUNTER — Telehealth: Payer: Self-pay | Admitting: Urology

## 2023-02-17 DIAGNOSIS — I480 Paroxysmal atrial fibrillation: Secondary | ICD-10-CM | POA: Diagnosis not present

## 2023-02-17 NOTE — Therapy (Signed)
OUTPATIENT PHYSICAL THERAPY NEURO TREATMENT   Patient Name: Craig Gilmore MRN: [id number] DOB:12/10/1934, 88 y.o., male Today's Date: 02/18/2023   PCP: Dr. Jerl Mina   REFERRING PROVIDER: Dr. Jerl Mina  END OF SESSION:  PT End of Session - 02/18/23 1141     Visit Number 5    Number of Visits 24    Date for PT Re-Evaluation 04/24/23    Progress Note Due on Visit 10    PT Start Time 1145    PT Stop Time 1229    PT Time Calculation (min) 44 min    Equipment Utilized During Treatment Gait belt    Activity Tolerance Patient tolerated treatment well    Behavior During Therapy WFL for tasks assessed/performed                Past Medical History:  Diagnosis Date   Chronic atrial fibrillation (HCC)    a.) CHA2DS2VASc = 3 (age x 2, HTN);  b.) s/p DCCV 08/20/2004 --> 50 J x 1 and 100 J x 1 --> coverted with SB. (+) post-procedure nausea; c.) rate/rhythm maintained without pharmacological intervention; chronically anticoagulated with warfarin   Colonic polyp    COPD (chronic obstructive pulmonary disease) (HCC)    Depression    Dyspnea    Edema    GERD (gastroesophageal reflux disease)    Gout    Hemorrhoids    Hyperlipidemia    Hypertension    Insomnia    a.) on hypnotic (zolpidem)   Long term current use of anticoagulant    a.) warfarin   OSA on CPAP    Pneumonia    Pulmonary HTN (HCC) 02/14/2016   a.) TTE 02/14/2016: RVSP 37.5 mmHg   Sick sinus syndrome (HCC)    Squamous cell carcinoma in situ 10/03/2009   L medial infrapectoral   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   L nose supratip   Stage 3b chronic kidney disease (CKD) (HCC)    Past Surgical History:  Procedure Laterality Date   CARDIOVERSION N/A 08/21/2004   Procedure: CIRECT CURRENT CARDIOVERSION; Location: [facility]; Surgeon: Harold Hedge, MD   CATARACT EXTRACTION, BILATERAL     CHOLECYSTECTOMY     COLONOSCOPY     NASAL SINUS SURGERY     Patient Active Problem List   Diagnosis Date  Noted   Hyponatremia 12/19/2022   Sick sinus syndrome (HCC) 12/16/2022   Interstitial lung disease (HCC) 12/16/2022   Thoracic aortic aneurysm (HCC) 12/16/2022   Sepsis due to pneumonia (HCC) 12/16/2022   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 tophus 02/07/2022   Stage 3b chronic kidney disease (HCC) 02/07/2022   Hyperlipidemia 05/04/2015   Encounter for therapeutic drug monitoring 11/24/2013   Exertional dyspnea 11/23/2013   Chronic atrial fibrillation (HCC)    Pneumonia, organism unspecified(486) 08/17/2012    ONSET DATE: 2-3  REFERRING DIAG:  R26.81 (ICD-10-CM) - Unsteadiness on feet  R68.89 (ICD-10-CM) - Other general symptoms and signs  Z87.01 (ICD-10-CM) - Personal history of pneumonia (recurrent)    THERAPY DIAG:  Unsteadiness on feet  Other abnormalities of gait and mobility  Abnormality of gait and mobility  Difficulty in walking, not elsewhere classified  Rationale for Evaluation and Treatment: Rehabilitation  SUBJECTIVE:  SUBJECTIVE STATEMENT: Patient has been to urologist and no longer has pain. Is having vision problems in one eye due to eye irritation.    Pt accompanied by:  wife- [name] and son  PERTINENT HISTORY: Patient is a 88 year old male with referall from PCP for imbalance and past medical history includes a recent hospitalization- 12/16/2022- 5 days for sepsis and PNA. PMH includes renal lesion, A-Fib, COPD, Depression, HTN, GOUT, Sleep apnea,  and HLD.  Wife reports he has been having balance issues for 2-3 years but having more unsteadiness recently  PAIN:  Are you having pain? No  PRECAUTIONS: Fall  RED FLAGS: None   WEIGHT BEARING RESTRICTIONS: No  FALLS: Has patient fallen in last 6 months?  No falls but several near falls.   LIVING  ENVIRONMENT: Lives with: lives with their family- wife (son stays during the day)  Lives in: House/apartment Stairs: Yes: External: 3 steps; on right going up Has following equipment at home: Single point cane, Rollator  PLOF: Requires assistive device for independence and Needs assistance with ADLs  PATIENT GOALS: To improve my balance and not fall  OBJECTIVE:  Note: Objective measures were completed at Evaluation unless otherwise noted.  DIAGNOSTIC FINDINGS:  CT SCAN- 12/16/2022 IMPRESSION: 1. No evidence for aortic dissection or aneurysm. 2. Ascending thoracic aortic aneurysm measuring 4.3 x 3.7 cm. Recommend annual imaging followup by CTA or MRA. This recommendation follows 2010 ACCF/AHA/AATS/ACR/ASA/SCA/SCAI/SIR/STS/SVM Guidelines for the Diagnosis and Management of Patients with Thoracic Aortic Disease. Circulation. 2010; 121: Z366-Y403. Aortic aneurysm NOS (ICD10-I71.9) 3. Pathologic mediastinal and right hilar lymphadenopathy of uncertain etiology. 4. Findings compatible with interstitial lung disease. 5. Ground-glass opacity in the right lower lobe measuring 3.3 cm. This may be infectious/inflammatory, but neoplasm can not be excluded. 6. 4.8 cm indeterminate right renal lesion, possibly complex cysts. Recommend further evaluation with dedicated CT or MRI. 7. Bladder diverticulum. 8. Colonic diverticulosis. 9. Prostatomegaly. 10. Small fat containing left inguinal hernia.     Electronically Signed   By: Darliss Cheney M.D.   On: 12/16/2022 23:30  COGNITION: Overall cognitive status: Impaired- forgetful   SENSATION: WFL  COORDINATION: Delayed at times with BLE  EDEMA:  None observed   POSTURE: rounded shoulders and forward head    LOWER EXTREMITY MMT:    MMT Right Eval Left Eval  Hip flexion 4 4  Hip extension    Hip abduction 4 4  Hip adduction 4 4  Hip internal rotation 4 4  Hip external rotation 4 4  Knee flexion 4 4  Knee extension 4 4   Ankle dorsiflexion 4 4  Ankle plantarflexion    Ankle inversion    Ankle eversion    (Blank rows = not tested)   TRANSFERS: Assistive device utilized: Single point cane  Sit to stand: Modified independence Stand to sit: Modified independence Chair to chair: Modified independence Floor:  Not tested   GAIT: Gait pattern: decreased arm swing- Right, decreased arm swing- Left, decreased step length- Right, decreased step length- Left, decreased stance time- Right, decreased stance time- Left, and decreased stride length Distance walked: > 200 feet in total Assistive device utilized: Single point cane Level of assistance: CGA Comments: decreased gait speed and VC to pick up feet  FUNCTIONAL TESTS:  5 times sit to stand: 22.68 sec withuout  Timed up and go (TUG): 13.99 sec with cane 6 minute walk test: To be performed next visit 10 meter walk test: 0.59 m/s Berg Balance Scale: 45/56  PATIENT SURVEYS:  FOTO 54  TODAY'S TREATMENT:                                                                                                                              DATE: 02/18/23   BP:  149/97 mmHg  TherEx: Sit to stand 10x catching ball and tossing 10x to spouse. Ambulate 160 ft with 3lb ankle weights; cues for picking up feet LAQ 10x with adduction ball squeeze  3lb ankle weights:  -6" step toe taps 10x each LE ; 2 sets  -LAQ 10x each LE hold 3 seconds  -Df 10x -Pf 10x -Lateral step over line 10x each side  Neuro Re-ed:  Standing with CGA next to support surface:  Airex pad: static stand 30 seconds x 2 trials, noticeable trembling of ankles/LE's with fatigue and challenge to maintain stability Airex pad: horizontal head turns 30 seconds scanning room 10x ; cueing for arc of motion  Airex pad: vertical head turns 30 seconds, cueing for arc of motion, noticeable sway with upward gaze increasing demand on ankle righting reaction musculature Airex pad 6" step airex pad lateral step  up/down x10  Airex pad dual task sort letters into  colors x4 minutes  Step over hurdle 10x with SUE support    PATIENT EDUCATION: Education details: PT plan of care; purpose of outcome measures Person educated: Patient Education method: Explanation Education comprehension: verbalized understanding  HOME EXERCISE PROGRAM: Access Code: [email or web address] URL: [email or web address] Date: 02/04/2023 Prepared by: Precious Bard  Exercises - Seated March  - 1 x daily - 7 x weekly - 2 sets - 10 reps - 5 hold - Leg Extension  - 1 x daily - 7 x weekly - 2 sets - 10 reps - 3 hold - Seated Hip Abduction with Resistance  - 1 x daily - 7 x weekly - 2 sets - 10 reps - 5 hold  GOALS: Goals reviewed with patient? Yes  SHORT TERM GOALS: Target date: 03/13/2023  Pt will be independent with HEP in order to improve strength and balance in order to decrease fall risk and improve function at home and work.  Baseline: EVAL: No formal HEP in place Goal status: INITIAL    LONG TERM GOALS: Target date: 04/24/2023  1.  Patient (> 98 years old) will complete five times sit to stand test in < 15 seconds indicating an increased LE strength and improved balance. Baseline: EVAL: 22.68 without UE support Goal status: INITIAL  2.  Patient will increase FOTO score to equal to or greater than  55   to demonstrate statistically significant improvement in mobility and quality of life.  Baseline: EVAL=54 Goal status: INITIAL   3.  Patient will increase Berg Balance score by > 4 points to demonstrate decreased fall risk during functional activities. Baseline: 45 Goal status: INITIAL   4.   Patient will reduce timed up and go to <11 seconds to reduce fall risk and demonstrate  improved transfer/gait ability. Baseline: EVAL=13.99 sec with cane Goal status: INITIAL  5.   Patient will increase 10 meter walk test to >1.93m/s as to improve gait speed for better community ambulation and to reduce fall  risk. Baseline: EVAL= 0.58 m/s Goal status: INITIAL  6.   Patient will increase six minute walk test distance to >1000 for progression to community ambulator and improve gait ability Baseline: EVAL: to be assessed 2nd visit 10/29: 715 ft  Goal status: INITIAL   ASSESSMENT:  CLINICAL IMPRESSION: Patient tolerates introduction of ankle weights. Will benefit from increased weight next session due to ease with use of weights. He is challenged with foot clearance with and without weights indicating continued need for focused interventions.  He will benefit from skilled PT services to improve his functional strength/mobility and overall balance for improved quality of life and decreased risk of falling.   OBJECTIVE IMPAIRMENTS: Abnormal gait, decreased activity tolerance, decreased balance, decreased cognition, decreased coordination, decreased endurance, decreased mobility, difficulty walking, decreased strength, and impaired flexibility.   ACTIVITY LIMITATIONS: carrying, lifting, bending, sitting, standing, squatting, stairs, and transfers  PARTICIPATION LIMITATIONS: cleaning, laundry, shopping, community activity, and yard work  PERSONAL FACTORS: Age and 1-2 comorbidities: COPD, HTN  are also affecting patient's functional outcome.   REHAB POTENTIAL: Good  CLINICAL DECISION MAKING: Evolving/moderate complexity  EVALUATION COMPLEXITY: Moderate  PLAN:  PT FREQUENCY: 1-2x/week  PT DURATION: 12 weeks  PLANNED INTERVENTIONS: 97164- PT Re-evaluation, 97110-Therapeutic exercises, 97530- Therapeutic activity, 97112- Neuromuscular re-education, 97535- Self Care, 44034- Manual therapy, [phone]- Gait training, [phone]- Orthotic Fit/training, [phone]- Canalith repositioning, [phone]- Electrical stimulation (manual), Patient/Family education, Balance training, Stair training, Taping, Dry Needling, Joint mobilization, Spinal mobilization, Vestibular training, Visual/preceptual remediation/compensation, DME  instructions, Cryotherapy, and Moist heat  PLAN FOR NEXT SESSION:  balance and therex    Precious Bard, PT 02/18/2023, 12:53 PM

## 2023-02-17 NOTE — Telephone Encounter (Signed)
Patient's wife dropped in office this afternoon. She stated that patient gets "antsy" only in the afternoon until around bedtime. He has said that he hurts where his bladder is. His wife is asking if there is anything we can recommend or prescribe for pain, or something that can calm him down. He has already tried medication for bladder spasms, and he doesn't feel it has helped.

## 2023-02-18 ENCOUNTER — Ambulatory Visit: Payer: No Typology Code available for payment source

## 2023-02-18 DIAGNOSIS — R269 Unspecified abnormalities of gait and mobility: Secondary | ICD-10-CM

## 2023-02-18 DIAGNOSIS — R262 Difficulty in walking, not elsewhere classified: Secondary | ICD-10-CM

## 2023-02-18 DIAGNOSIS — R2689 Other abnormalities of gait and mobility: Secondary | ICD-10-CM

## 2023-02-18 DIAGNOSIS — R2681 Unsteadiness on feet: Secondary | ICD-10-CM | POA: Diagnosis not present

## 2023-02-19 NOTE — Telephone Encounter (Signed)
Notified patient wife  as instructed, She is going to stop by the office today to pick up samples.

## 2023-02-19 NOTE — Addendum Note (Signed)
Addended by: Riki Altes on: 02/19/2023 07:38 AM   Modules accepted: Orders

## 2023-02-19 NOTE — Telephone Encounter (Signed)
Have him stop the oxybutynin if he has not already.  Trial Gemtesa-can give samples x 4 weeks

## 2023-02-19 NOTE — Therapy (Signed)
OUTPATIENT PHYSICAL THERAPY NEURO TREATMENT   Patient Name: Craig Gilmore MRN: [id number] DOB:Jan 30, 1935, 88 y.o., male Today's Date: 02/20/2023   PCP: Dr. Jerl Mina   REFERRING PROVIDER: Dr. Jerl Mina  END OF SESSION:  PT End of Session - 02/20/23 1401     Visit Number 6    Number of Visits 24    Date for PT Re-Evaluation 04/24/23    PT Start Time 1315    PT Stop Time 1359    PT Time Calculation (min) 44 min    Equipment Utilized During Treatment Gait belt    Activity Tolerance Patient tolerated treatment well    Behavior During Therapy WFL for tasks assessed/performed                 Past Medical History:  Diagnosis Date   Chronic atrial fibrillation (HCC)    a.) CHA2DS2VASc = 3 (age x 2, HTN);  b.) s/p DCCV 08/20/2004 --> 50 J x 1 and 100 J x 1 --> coverted with SB. (+) post-procedure nausea; c.) rate/rhythm maintained without pharmacological intervention; chronically anticoagulated with warfarin   Colonic polyp    COPD (chronic obstructive pulmonary disease) (HCC)    Depression    Dyspnea    Edema    GERD (gastroesophageal reflux disease)    Gout    Hemorrhoids    Hyperlipidemia    Hypertension    Insomnia    a.) on hypnotic (zolpidem)   Long term current use of anticoagulant    a.) warfarin   OSA on CPAP    Pneumonia    Pulmonary HTN (HCC) 02/14/2016   a.) TTE 02/14/2016: RVSP 37.5 mmHg   Sick sinus syndrome (HCC)    Squamous cell carcinoma in situ 10/03/2009   L medial infrapectoral   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   L nose supratip   Stage 3b chronic kidney disease (CKD) (HCC)    Past Surgical History:  Procedure Laterality Date   CARDIOVERSION N/A 08/21/2004   Procedure: CIRECT CURRENT CARDIOVERSION; Location: [facility]; Surgeon: Harold Hedge, MD   CATARACT EXTRACTION, BILATERAL     CHOLECYSTECTOMY     COLONOSCOPY     NASAL SINUS SURGERY     Patient Active Problem List   Diagnosis Date Noted   Hyponatremia 12/19/2022    Sick sinus syndrome (HCC) 12/16/2022   Interstitial lung disease (HCC) 12/16/2022   Thoracic aortic aneurysm (HCC) 12/16/2022   Sepsis due to pneumonia (HCC) 12/16/2022   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 tophus 02/07/2022   Stage 3b chronic kidney disease (HCC) 02/07/2022   Hyperlipidemia 05/04/2015   Encounter for therapeutic drug monitoring 11/24/2013   Exertional dyspnea 11/23/2013   Chronic atrial fibrillation (HCC)    Pneumonia, organism unspecified(486) 08/17/2012    ONSET DATE: 2-3  REFERRING DIAG:  R26.81 (ICD-10-CM) - Unsteadiness on feet  R68.89 (ICD-10-CM) - Other general symptoms and signs  Z87.01 (ICD-10-CM) - Personal history of pneumonia (recurrent)    THERAPY DIAG:  Unsteadiness on feet  Abnormality of gait and mobility  Muscle weakness (generalized)  Other lack of coordination  Difficulty in walking, not elsewhere classified  Rationale for Evaluation and Treatment: Rehabilitation  SUBJECTIVE:  SUBJECTIVE STATEMENT: Patient has been to urologist and no longer has pain. Pt's wife reports there has been one change to his medication list from his urologist.    Pt accompanied by:  wife- [name] and son  PERTINENT HISTORY: Patient is a 88 year old male with referall from PCP for imbalance and past medical history includes a recent hospitalization- 12/16/2022- 5 days for sepsis and PNA. PMH includes renal lesion, A-Fib, COPD, Depression, HTN, GOUT, Sleep apnea,  and HLD.  Wife reports he has been having balance issues for 2-3 years but having more unsteadiness recently  PAIN:  Are you having pain? No  PRECAUTIONS: Fall  RED FLAGS: None   WEIGHT BEARING RESTRICTIONS: No  FALLS: Has patient fallen in last 6 months?  No falls but several near falls.   LIVING  ENVIRONMENT: Lives with: lives with their family- wife (son stays during the day)  Lives in: House/apartment Stairs: Yes: External: 3 steps; on right going up Has following equipment at home: Single point cane, Rollator  PLOF: Requires assistive device for independence and Needs assistance with ADLs  PATIENT GOALS: To improve my balance and not fall  OBJECTIVE:  Note: Objective measures were completed at Evaluation unless otherwise noted.  DIAGNOSTIC FINDINGS:  CT SCAN- 12/16/2022 IMPRESSION: 1. No evidence for aortic dissection or aneurysm. 2. Ascending thoracic aortic aneurysm measuring 4.3 x 3.7 cm. Recommend annual imaging followup by CTA or MRA. This recommendation follows 2010 ACCF/AHA/AATS/ACR/ASA/SCA/SCAI/SIR/STS/SVM Guidelines for the Diagnosis and Management of Patients with Thoracic Aortic Disease. Circulation. 2010; 121: H474-Q595. Aortic aneurysm NOS (ICD10-I71.9) 3. Pathologic mediastinal and right hilar lymphadenopathy of uncertain etiology. 4. Findings compatible with interstitial lung disease. 5. Ground-glass opacity in the right lower lobe measuring 3.3 cm. This may be infectious/inflammatory, but neoplasm can not be excluded. 6. 4.8 cm indeterminate right renal lesion, possibly complex cysts. Recommend further evaluation with dedicated CT or MRI. 7. Bladder diverticulum. 8. Colonic diverticulosis. 9. Prostatomegaly. 10. Small fat containing left inguinal hernia.     Electronically Signed   By: Darliss Cheney M.D.   On: 12/16/2022 23:30  COGNITION: Overall cognitive status: Impaired- forgetful   SENSATION: WFL  COORDINATION: Delayed at times with BLE  EDEMA:  None observed   POSTURE: rounded shoulders and forward head    LOWER EXTREMITY MMT:    MMT Right Eval Left Eval  Hip flexion 4 4  Hip extension    Hip abduction 4 4  Hip adduction 4 4  Hip internal rotation 4 4  Hip external rotation 4 4  Knee flexion 4 4  Knee extension 4 4   Ankle dorsiflexion 4 4  Ankle plantarflexion    Ankle inversion    Ankle eversion    (Blank rows = not tested)   TRANSFERS: Assistive device utilized: Single point cane  Sit to stand: Modified independence Stand to sit: Modified independence Chair to chair: Modified independence Floor:  Not tested   GAIT: Gait pattern: decreased arm swing- Right, decreased arm swing- Left, decreased step length- Right, decreased step length- Left, decreased stance time- Right, decreased stance time- Left, and decreased stride length Distance walked: > 200 feet in total Assistive device utilized: Single point cane Level of assistance: CGA Comments: decreased gait speed and VC to pick up feet  FUNCTIONAL TESTS:  5 times sit to stand: 22.68 sec withuout  Timed up and go (TUG): 13.99 sec with cane 6 minute walk test: To be performed next visit 10 meter walk test: 0.59 m/s Sharlene Motts  Balance Scale: 45/56  PATIENT SURVEYS:  FOTO 54  TODAY'S TREATMENT:                                                                                                                              DATE: 02/20/23  TherEx: -Basketball STS x20 -Lateral steps over foam roller with 3#aw 1x10 each LE, pt reports task as easy  - Lateral steps over hurdle with 3#aw 1x10 each LE - Forward step ups with 3#aw 2x10 each LE, HR: 105 bpm post activity  Neuro Re-ed:  Blaze pod activity- Three pods set to random and arranged in a horizontal line. As pods light up, patient taps with his LE. Activity focusing on single leg balance and righting reactions. Performed three rounds on each LE with 10 hits each round. No UE support  Standing with CGA next to support surface:  Airex pad: one foot on Dynadisc one foot on airex pad, hold position for 30 seconds, switch legs, 2x each LE; second trial of each LE with dual tasking of rearranging letters x3 minutes   PATIENT EDUCATION: Education details: PT plan of care; purpose of outcome  measures Person educated: Patient Education method: Explanation Education comprehension: verbalized understanding  HOME EXERCISE PROGRAM: Access Code: [email or web address] URL: [email or web address] Date: 02/04/2023 Prepared by: Precious Bard  Exercises - Seated March  - 1 x daily - 7 x weekly - 2 sets - 10 reps - 5 hold - Leg Extension  - 1 x daily - 7 x weekly - 2 sets - 10 reps - 3 hold - Seated Hip Abduction with Resistance  - 1 x daily - 7 x weekly - 2 sets - 10 reps - 5 hold  GOALS: Goals reviewed with patient? Yes  SHORT TERM GOALS: Target date: 03/13/2023  Pt will be independent with HEP in order to improve strength and balance in order to decrease fall risk and improve function at home and work.  Baseline: EVAL: No formal HEP in place Goal status: INITIAL    LONG TERM GOALS: Target date: 04/24/2023  1.  Patient (> 74 years old) will complete five times sit to stand test in < 15 seconds indicating an increased LE strength and improved balance. Baseline: EVAL: 22.68 without UE support Goal status: INITIAL  2.  Patient will increase FOTO score to equal to or greater than  55   to demonstrate statistically significant improvement in mobility and quality of life.  Baseline: EVAL=54 Goal status: INITIAL   3.  Patient will increase Berg Balance score by > 4 points to demonstrate decreased fall risk during functional activities. Baseline: 45 Goal status: INITIAL   4.   Patient will reduce timed up and go to <11 seconds to reduce fall risk and demonstrate improved transfer/gait ability. Baseline: EVAL=13.99 sec with cane Goal status: INITIAL  5.   Patient will increase 10 meter walk test to >1.60m/s as to improve gait speed for better community ambulation and  to reduce fall risk. Baseline: EVAL= 0.58 m/s Goal status: INITIAL  6.   Patient will increase six minute walk test distance to >1000 for progression to community ambulator and improve gait ability Baseline:  EVAL: to be assessed 2nd visit 10/29: 715 ft  Goal status: INITIAL   ASSESSMENT:  CLINICAL IMPRESSION: Patient participated in treatment session focusing on LE strengthening and balance. With performance of dual task and balance activities, there is noticeable trembling of LE's indicating a need to progress balance. He is demonstrating improvements in single leg balance evidenced by the ability to stand on one leg and tap a blaze pod without UE support. He will benefit from skilled PT services to improve his functional strength/mobility and overall balance for improved quality of life and decreased risk of falling.   OBJECTIVE IMPAIRMENTS: Abnormal gait, decreased activity tolerance, decreased balance, decreased cognition, decreased coordination, decreased endurance, decreased mobility, difficulty walking, decreased strength, and impaired flexibility.   ACTIVITY LIMITATIONS: carrying, lifting, bending, sitting, standing, squatting, stairs, and transfers  PARTICIPATION LIMITATIONS: cleaning, laundry, shopping, community activity, and yard work  PERSONAL FACTORS: Age and 1-2 comorbidities: COPD, HTN  are also affecting patient's functional outcome.   REHAB POTENTIAL: Good  CLINICAL DECISION MAKING: Evolving/moderate complexity  EVALUATION COMPLEXITY: Moderate  PLAN:  PT FREQUENCY: 1-2x/week  PT DURATION: 12 weeks  PLANNED INTERVENTIONS: 97164- PT Re-evaluation, 97110-Therapeutic exercises, 97530- Therapeutic activity, 97112- Neuromuscular re-education, 97535- Self Care, 10272- Manual therapy, [phone]- Gait training, [phone]- Orthotic Fit/training, [phone]- Canalith repositioning, [phone]- Electrical stimulation (manual), Patient/Family education, Balance training, Stair training, Taping, Dry Needling, Joint mobilization, Spinal mobilization, Vestibular training, Visual/preceptual remediation/compensation, DME instructions, Cryotherapy, and Moist heat  PLAN FOR NEXT SESSION:  balance and therex    Randon Goldsmith, Student-PT   This entire session was performed under direct supervision and direction of a licensed therapist/therapist assistant . I have personally read, edited and approve of the note as written.  Precious Bard, PT 02/20/2023, 5:10 PM

## 2023-02-20 ENCOUNTER — Other Ambulatory Visit: Payer: No Typology Code available for payment source | Admitting: Urology

## 2023-02-20 ENCOUNTER — Ambulatory Visit: Payer: No Typology Code available for payment source

## 2023-02-20 DIAGNOSIS — R2681 Unsteadiness on feet: Secondary | ICD-10-CM

## 2023-02-20 DIAGNOSIS — R262 Difficulty in walking, not elsewhere classified: Secondary | ICD-10-CM

## 2023-02-20 DIAGNOSIS — M6281 Muscle weakness (generalized): Secondary | ICD-10-CM

## 2023-02-20 DIAGNOSIS — R269 Unspecified abnormalities of gait and mobility: Secondary | ICD-10-CM

## 2023-02-20 DIAGNOSIS — R278 Other lack of coordination: Secondary | ICD-10-CM

## 2023-02-21 ENCOUNTER — Other Ambulatory Visit: Payer: No Typology Code available for payment source | Admitting: Urology

## 2023-02-25 ENCOUNTER — Ambulatory Visit
Admission: RE | Admit: 2023-02-25 | Discharge: 2023-02-25 | Disposition: A | Discharge status: Home / Self Care | Payer: No Typology Code available for payment source | Source: Ambulatory Visit | Attending: Urology | Admitting: Urology

## 2023-02-25 ENCOUNTER — Telehealth: Payer: Self-pay | Admitting: Urology

## 2023-02-25 ENCOUNTER — Ambulatory Visit: Payer: No Typology Code available for payment source | Admitting: Physical Therapy

## 2023-02-25 DIAGNOSIS — N401 Enlarged prostate with lower urinary tract symptoms: Secondary | ICD-10-CM | POA: Diagnosis not present

## 2023-02-25 DIAGNOSIS — R339 Retention of urine, unspecified: Secondary | ICD-10-CM | POA: Diagnosis not present

## 2023-02-25 DIAGNOSIS — Z960 Presence of urogenital implants: Secondary | ICD-10-CM | POA: Diagnosis not present

## 2023-02-25 HISTORY — PX: IR RADIOLOGIST EVAL & MGMT: IMG5224

## 2023-02-25 NOTE — Telephone Encounter (Signed)
Called patient's wife to relay message that patient can keep cath in for one month. Last cath was 02/12/23.

## 2023-02-25 NOTE — Telephone Encounter (Signed)
Patient and wife dropped in office today and asked if he needs a catheter change before his PAE procedure that is a week and a half from now (no exact date is set). Dr. Archer Asa told them to ask. Please call patient's wife to advise. [phone]

## 2023-02-25 NOTE — Consult Note (Signed)
Chief Complaint: Patient was seen in consultation today for BPH with urinary retention and Foley catheter dependence at the request of Stoioff,Scott C  Referring Physician(s): Stoioff,Scott C  History of Present Illness: Craig Gilmore is a 88 y.o. male with a history of recent hospitalization for urinary retention this past September.  Prior to this hospitalization, he was having lower urinary tract symptoms including urinary frequency, postvoid dribbling and having to get up multiple times throughout the night to void.  He was initiated on tamsulosin and a Foley catheter was placed.  At the time of discharge, the Foley catheter was removed and he initially did well with some improvement in symptoms.  However, bladder scan on follow-up demonstrates significant postvoid residual (586 mL) necessitating replacement of the Foley catheter.  Due to his age and medical comorbidities, he is not an optimal candidate for surgical treatments of BPH.  Therefore, he and his wife present today to discuss endovascular options.  Craig Gilmore is in his usual state of health.  He expresses quite a bit of discomfort and inconvenience with the indwelling Foley catheter.  He filled out the IPSS form and scored a 17 consistent with the upper end of the moderate symptom severity range.  His quality of life is also affected and is consistent with a mixed.  Past Medical History:  Diagnosis Date   Chronic atrial fibrillation (HCC)    a.) CHA2DS2VASc = 3 (age x 2, HTN);  b.) s/p DCCV 08/20/2004 --> 50 J x 1 and 100 J x 1 --> coverted with SB. (+) post-procedure nausea; c.) rate/rhythm maintained without pharmacological intervention; chronically anticoagulated with warfarin   Colonic polyp    COPD (chronic obstructive pulmonary disease) (HCC)    Depression    Dyspnea    Edema    GERD (gastroesophageal reflux disease)    Gout    Hemorrhoids    Hyperlipidemia    Hypertension    Insomnia    a.) on hypnotic  (zolpidem)   Long term current use of anticoagulant    a.) warfarin   OSA on CPAP    Pneumonia    Pulmonary HTN (HCC) 02/14/2016   a.) TTE 02/14/2016: RVSP 37.5 mmHg   Sick sinus syndrome (HCC)    Squamous cell carcinoma in situ 10/03/2009   L medial infrapectoral   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   L nose supratip   Stage 3b chronic kidney disease (CKD) (HCC)     Past Surgical History:  Procedure Laterality Date   CARDIOVERSION N/A 08/21/2004   Procedure: CIRECT CURRENT CARDIOVERSION; Location: [facility]; Surgeon: Harold Hedge, MD   CATARACT EXTRACTION, BILATERAL     CHOLECYSTECTOMY     COLONOSCOPY     IR RADIOLOGIST EVAL & MGMT  02/25/2023   NASAL SINUS SURGERY      Allergies: Baycol [cerivastatin] and Pravastatin  Medications: Prior to Admission medications   Medication Sig Start Date End Date Taking? Authorizing Provider  albuterol (VENTOLIN HFA) 108 (90 Base) MCG/ACT inhaler Inhale 2 puffs into the lungs 2 (two) times daily. 01/27/23 01/27/24  [provider]  azithromycin (ZITHROMAX) 250 MG tablet Take 250 mg by mouth daily.    [provider]  buPROPion (WELLBUTRIN XL) 150 MG 24 hr tablet Take 75 mg by mouth every morning.    [provider]  Choline Fenofibrate (FENOFIBRIC ACID) 135 MG CPDR Take 1 capsule by mouth  daily Patient taking differently: Take 1 tablet by mouth  every evening. Take 1 capsule by mouth  daily 05/18/15   Iran Ouch, MD  dutasteride (AVODART) 0.5 MG capsule Take 1 capsule (0.5 mg total) by mouth daily. 01/31/23   Vaillancourt, Lelon Mast, PA-C  febuxostat (ULORIC) 40 MG tablet Take 40 mg by mouth every evening.    [provider]  fluticasone (FLONASE) 50 MCG/ACT nasal spray Place 1 spray into both nostrils at bedtime.    [provider]  furosemide (LASIX) 20 MG tablet Take 1 tablet (20 mg total) by mouth daily as needed for fluid or edema (Take 1 tablet for worsening leg swelling, shortness  of breath, or weight gain of greater than 3 lbs.). 12/21/22   Marcelino Duster, MD  ipratropium (ATROVENT) 0.03 % nasal spray Place 2 sprays into both nostrils every morning.    [provider]  levocetirizine (XYZAL) 5 MG tablet Take 5 mg by mouth every evening.    [provider]  omeprazole (PRILOSEC OTC) 20 MG tablet Take 20 mg by mouth daily before breakfast.    [provider]  prednisoLONE 5 MG TABS tablet Take 5 mg by mouth daily.    [provider]  predniSONE (DELTASONE) 5 MG tablet Take 5 mg by mouth daily. 01/27/23 04/27/23  [provider]  sertraline (ZOLOFT) 100 MG tablet Take 150 mg by mouth every morning.    [provider]  tamsulosin (FLOMAX) 0.4 MG CAPS capsule Take 1 capsule (0.4 mg total) by mouth daily after supper. 12/20/22   Marcelino Duster, MD  warfarin (COUMADIN) 2.5 MG tablet Take 1 tablet (2.5 mg total) by mouth every evening. Take as directed by anticoagulation clinic 12/23/22   Marcelino Duster, MD  zolpidem (AMBIEN) 10 MG tablet Take 10 mg by mouth at bedtime.    [provider]     Family History  Problem Relation Age of Onset   Diabetes Mother    Heart disease Mother    Heart disease Father    Aneurysm Father     Social History   Socioeconomic History   Marital status: Married    Spouse name: Not on file   Number of children: Not on file   Years of education: Not on file   Highest education level: Not on file  Occupational History   Not on file  Tobacco Use   Smoking status: Former    Current packs/day: 0.00    Average packs/day: 1.5 packs/day for 50.0 years (75.0 ttl pk-yrs)    Types: Cigarettes    Start date: 04/09/1939    Quit date: 04/08/1989    Years since quitting: 33.9   Smokeless tobacco: Never  Vaping Use   Vaping status: Never Used  Substance and Sexual Activity   Alcohol use: No   Drug use: No   Sexual activity: Not on file  Other Topics Concern   Not on  file  Social History Narrative   Not on file   Social Determinants of Health   Financial Resource Strain: Low Risk  (05/27/2022)   Received from [facility]   Overall Financial Resource Strain (CARDIA)    Difficulty of Paying Living Expenses: Not very hard  Food Insecurity: No Food Insecurity (01/22/2023)   Hunger Vital Sign    Worried About Running Out of Food in the Last Year: Never true    Ran Out of Food in the Last Year: Never true  Transportation Needs: No Transportation Needs (01/22/2023)   PRAPARE -  Administrator, Civil Service (Medical): No    Lack of Transportation (Non-Medical): No  Physical Activity: Not on file  Stress: Not on file  Social Connections: Not on file   Review of Systems: A 12 point ROS discussed and pertinent positives are indicated in the HPI above.  All other systems are negative.  Review of Systems  Vital Signs: BP (!) 199/103 (BP Location: Left Arm, Patient Position: Sitting, Cuff Size: Normal)   Pulse 80   Temp 97.9 F (36.6 C) (Oral)   SpO2 94%   Advance Care Plan: The advanced care plan/surrogate decision maker was discussed at the time of visit and the patient did not wish to discuss or was not able to name a surrogate decision maker or provide an advance care plan.    Physical Exam Constitutional:      General: He is not in acute distress.    Appearance: Normal appearance.  HENT:     Head: Normocephalic and atraumatic.  Eyes:     General: No scleral icterus. Cardiovascular:     Rate and Rhythm: Normal rate.  Pulmonary:     Effort: Pulmonary effort is normal.  Abdominal:     Palpations: Abdomen is soft.     Tenderness: There is no abdominal tenderness.  Skin:    General: Skin is warm and dry.  Neurological:     Mental Status: He is alert and oriented to person, place, and time.  Psychiatric:        Mood and Affect: Mood normal.        Behavior: Behavior normal.        Imaging: IR Radiologist Eval & Mgmt  Result Date: 02/25/2023 EXAM: NEW PATIENT OFFICE VISIT CHIEF COMPLAINT: SEE NOTE IN EPIC HISTORY OF PRESENT ILLNESS: SEE NOTE IN EPIC REVIEW OF SYSTEMS: SEE NOTE IN EPIC PHYSICAL EXAMINATION: SEE NOTE IN EPIC ASSESSMENT AND PLAN: SEE NOTE IN EPIC Electronically Signed   By: Malachy Moan M.D.   On: 02/25/2023 14:05    Labs:  CBC: Recent Labs    12/17/22 0506 12/18/22 0341 12/19/22 0630 12/20/22 0528  WBC 9.6 9.1 14.1* 11.3*  HGB 12.5* 13.0 13.8 13.2  HCT 39.2 39.9 41.0 40.2  PLT 222 231 260 250    COAGS: Recent Labs    12/17/22 0506 12/18/22 0341 12/19/22 0630 12/20/22 0528  INR 2.8* 3.4* 3.4* 3.6*    BMP: Recent Labs    12/17/22 0506 12/18/22 0341 12/19/22 0630 12/20/22 0528  NA 135 134* 133* 133*  K 3.5 3.5 3.5 3.7  CL 103 105 99 101  CO2 22 19* 19* 20*  GLUCOSE 97 98 105* 99  BUN 36* 33* 35* 37*  CALCIUM 8.6* 8.5* 9.1 8.7*  CREATININE 1.64* 1.61* 1.79* 1.82*  GFRNONAA 40* 41* 36* 36*    LIVER FUNCTION TESTS: Recent Labs    12/16/22 1907 12/17/22 0506  BILITOT 1.0 0.9  AST 36 29  ALT 23 18  ALKPHOS 69 53  PROT 7.8 6.4*  ALBUMIN 4.0 3.3*    TUMOR MARKERS: No results for input(s): "AFPTM", "CEA", "CA199", "CHROMGRNA" in the last 8760 hours.  Assessment and Plan:  Extremely pleasant 88 year old gentleman with benign prostatic hyperplasia resulting in severe lower urinary tract symptoms and Foley catheter dependence due to urinary retention.  He scored a 17 on the International prostate symptom score (IPSS) consistent with moderate symptoms.  His dependence on a Foley catheter is more concerning and poses a longer-term risk of recurrent  urinary tract infections.  He is an optimal candidate for prostate artery angiography and embolization.  I explained the risks, benefits and alternatives in detail to both him and his wife.  They voiced their understanding and would like to proceed as soon as  possible.  1.) Please schedule for [facility]E at [facility] te be performed by me as soon as possible.    Thank you for this interesting consult.  I greatly enjoyed meeting Craig Gilmore and look forward to participating in their care.  A copy of this report was sent to the requesting provider on this date.  Electronically Signed: Sterling Big 02/25/2023, 2:47 PM   I spent a total of 40 Minutes  in face to face in clinical consultation, greater than 50% of which was counseling/coordinating care for Foley catheter dependent BPH with LUTS

## 2023-02-25 NOTE — Telephone Encounter (Signed)
Patient only can keep cath in for one month . Last cath was 02/12/2023

## 2023-02-26 ENCOUNTER — Other Ambulatory Visit: Payer: Self-pay | Admitting: Interventional Radiology

## 2023-02-26 DIAGNOSIS — N401 Enlarged prostate with lower urinary tract symptoms: Secondary | ICD-10-CM

## 2023-02-27 ENCOUNTER — Telehealth: Payer: Self-pay

## 2023-02-27 ENCOUNTER — Ambulatory Visit: Payer: No Typology Code available for payment source

## 2023-02-27 NOTE — Telephone Encounter (Signed)
Pt contacted via telephone and author left voice mail informing of missed appointment and informed pt of future PT appointment date and time.   Randon Goldsmith, Student-PT

## 2023-03-04 ENCOUNTER — Ambulatory Visit: Payer: No Typology Code available for payment source

## 2023-03-10 DIAGNOSIS — G4733 Obstructive sleep apnea (adult) (pediatric): Secondary | ICD-10-CM | POA: Diagnosis not present

## 2023-03-10 DIAGNOSIS — I10 Essential (primary) hypertension: Secondary | ICD-10-CM | POA: Diagnosis not present

## 2023-03-10 NOTE — Therapy (Incomplete)
OUTPATIENT PHYSICAL THERAPY NEURO TREATMENT   Patient Name: Craig Gilmore MRN: [id number] DOB:[birth year], 88 y.o., male Today's Date: 03/10/2023   PCP: Dr. Jerl Mina   REFERRING PROVIDER: Dr. Jerl Mina  END OF SESSION:        Past Medical History:  Diagnosis Date   Chronic atrial fibrillation [facility])    a.) CHA2DS2VASc = 3 (age x 2, HTN);  b.) s/p DCCV 08/20/2004 --> 50 J x 1 and 100 J x 1 --> coverted with SB. (+) post-procedure nausea; c.) rate/rhythm maintained without pharmacological intervention; chronically anticoagulated with warfarin   Colonic polyp    COPD (chronic obstructive pulmonary disease) (HCC)    Depression    Dyspnea    Edema    GERD (gastroesophageal reflux disease)    Gout    Hemorrhoids    Hyperlipidemia    Hypertension    Insomnia    a.) on hypnotic (zolpidem)   Long term current use of anticoagulant    a.) warfarin   OSA on CPAP    Pneumonia    Pulmonary HTN (HCC) 02/14/2016   a.) TTE 02/14/2016: RVSP 37.5 mmHg   Sick sinus syndrome (HCC)    Squamous cell carcinoma in situ 10/03/2009   L medial infrapectoral   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   L nose supratip   Stage 3b chronic kidney disease (CKD) (HCC)    Past Surgical History:  Procedure Laterality Date   CARDIOVERSION N/A 08/21/2004   Procedure: CIRECT CURRENT CARDIOVERSION; Location: [facility]; Surgeon: Harold Hedge, MD   CATARACT EXTRACTION, BILATERAL     CHOLECYSTECTOMY     COLONOSCOPY     IR RADIOLOGIST EVAL & MGMT  02/25/2023   NASAL SINUS SURGERY     Patient Active Problem List   Diagnosis Date Noted   Hyponatremia 12/19/2022   Sick sinus syndrome (HCC) 12/16/2022   Interstitial lung disease (HCC) 12/16/2022   Thoracic aortic aneurysm (HCC) 12/16/2022   Sepsis due to pneumonia (HCC) 12/16/2022   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 tophus 02/07/2022   Stage 3b chronic kidney disease (HCC) 02/07/2022   Hyperlipidemia 05/04/2015   Encounter for  therapeutic drug monitoring 11/24/2013   Exertional dyspnea 11/23/2013   Chronic atrial fibrillation (HCC)    Pneumonia, organism unspecified(486) 08/17/2012    ONSET DATE: 2-3  REFERRING DIAG:  R26.81 (ICD-10-CM) - Unsteadiness on feet  R68.89 (ICD-10-CM) - Other general symptoms and signs  Z87.01 (ICD-10-CM) - Personal history of pneumonia (recurrent)    THERAPY DIAG:  No diagnosis found.  Rationale for Evaluation and Treatment: Rehabilitation  SUBJECTIVE:  SUBJECTIVE STATEMENT: Patient has been to urologist and no longer has pain. Pt's wife reports there has been one change to his medication list from his urologist.    Pt accompanied by:  wife- [name] and son  PERTINENT HISTORY: Patient is a 88 year old male with referall from PCP for imbalance and past medical history includes a recent hospitalization- 12/16/2022- 5 days for sepsis and PNA. PMH includes renal lesion, A-Fib, COPD, Depression, HTN, GOUT, Sleep apnea,  and HLD.  Wife reports he has been having balance issues for 2-3 years but having more unsteadiness recently  PAIN:  Are you having pain? No  PRECAUTIONS: Fall  RED FLAGS: None   WEIGHT BEARING RESTRICTIONS: No  FALLS: Has patient fallen in last 6 months?  No falls but several near falls.   LIVING ENVIRONMENT: Lives with: lives with their family- wife (son stays during the day)  Lives in: House/apartment Stairs: Yes: External: 3 steps; on right going up Has following equipment at home: Single point cane, Rollator  PLOF: Requires assistive device for independence and Needs assistance with ADLs  PATIENT GOALS: To improve my balance and not fall  OBJECTIVE:  Note: Objective measures were completed at Evaluation unless otherwise noted.  DIAGNOSTIC FINDINGS:  CT SCAN-  12/16/2022 IMPRESSION: 1. No evidence for aortic dissection or aneurysm. 2. Ascending thoracic aortic aneurysm measuring 4.3 x 3.7 cm. Recommend annual imaging followup by CTA or MRA. This recommendation follows 2010 ACCF/AHA/AATS/ACR/ASA/SCA/SCAI/SIR/STS/SVM Guidelines for the Diagnosis and Management of Patients with Thoracic Aortic Disease. Circulation. 2010; 121: Z610-R604. Aortic aneurysm NOS (ICD10-I71.9) 3. Pathologic mediastinal and right hilar lymphadenopathy of uncertain etiology. 4. Findings compatible with interstitial lung disease. 5. Ground-glass opacity in the right lower lobe measuring 3.3 cm. This may be infectious/inflammatory, but neoplasm can not be excluded. 6. 4.8 cm indeterminate right renal lesion, possibly complex cysts. Recommend further evaluation with dedicated CT or MRI. 7. Bladder diverticulum. 8. Colonic diverticulosis. 9. Prostatomegaly. 10. Small fat containing left inguinal hernia.     Electronically Signed   By: Darliss Cheney M.D.   On: 12/16/2022 23:30  COGNITION: Overall cognitive status: Impaired- forgetful   SENSATION: WFL  COORDINATION: Delayed at times with BLE  EDEMA:  None observed   POSTURE: rounded shoulders and forward head    LOWER EXTREMITY MMT:    MMT Right Eval Left Eval  Hip flexion 4 4  Hip extension    Hip abduction 4 4  Hip adduction 4 4  Hip internal rotation 4 4  Hip external rotation 4 4  Knee flexion 4 4  Knee extension 4 4  Ankle dorsiflexion 4 4  Ankle plantarflexion    Ankle inversion    Ankle eversion    (Blank rows = not tested)   TRANSFERS: Assistive device utilized: Single point cane  Sit to stand: Modified independence Stand to sit: Modified independence Chair to chair: Modified independence Floor:  Not tested   GAIT: Gait pattern: decreased arm swing- Right, decreased arm swing- Left, decreased step length- Right, decreased step length- Left, decreased stance time- Right,  decreased stance time- Left, and decreased stride length Distance walked: > 200 feet in total Assistive device utilized: Single point cane Level of assistance: CGA Comments: decreased gait speed and VC to pick up feet  FUNCTIONAL TESTS:  5 times sit to stand: 22.68 sec withuout  Timed up and go (TUG): 13.99 sec with cane 6 minute walk test: To be performed next visit 10 meter walk test: 0.59 m/s Sharlene Motts  Balance Scale: 45/56  PATIENT SURVEYS:  FOTO 54  TODAY'S TREATMENT:                                                                                                                              DATE: 03/10/23  TherEx: -Basketball STS x20 -Lateral steps over foam roller with 3#aw 1x10 each LE, pt reports task as easy  - Lateral steps over hurdle with 3#aw 1x10 each LE - Forward step ups with 3#aw 2x10 each LE, HR: 105 bpm post activity  Neuro Re-ed:  Blaze pod activity- Three pods set to random and arranged in a horizontal line. As pods light up, patient taps with his LE. Activity focusing on single leg balance and righting reactions. Performed three rounds on each LE with 10 hits each round. No UE support  Standing with CGA next to support surface:  Airex pad: one foot on Dynadisc one foot on airex pad, hold position for 30 seconds, switch legs, 2x each LE; second trial of each LE with dual tasking of rearranging letters x3 minutes   PATIENT EDUCATION: Education details: PT plan of care; purpose of outcome measures Person educated: Patient Education method: Explanation Education comprehension: verbalized understanding  HOME EXERCISE PROGRAM: Access Code: [email or web address] URL: [email or web address] Date: 02/04/2023 Prepared by: Precious Bard  Exercises - Seated March  - 1 x daily - 7 x weekly - 2 sets - 10 reps - 5 hold - Leg Extension  - 1 x daily - 7 x weekly - 2 sets - 10 reps - 3 hold - Seated Hip Abduction with Resistance  - 1 x daily - 7 x weekly - 2 sets - 10 reps -  5 hold  GOALS: Goals reviewed with patient? Yes  SHORT TERM GOALS: Target date: 03/13/2023  Pt will be independent with HEP in order to improve strength and balance in order to decrease fall risk and improve function at home and work.  Baseline: EVAL: No formal HEP in place Goal status: INITIAL    LONG TERM GOALS: Target date: 04/24/2023  1.  Patient (> 52 years old) will complete five times sit to stand test in < 15 seconds indicating an increased LE strength and improved balance. Baseline: EVAL: 22.68 without UE support Goal status: INITIAL  2.  Patient will increase FOTO score to equal to or greater than  55   to demonstrate statistically significant improvement in mobility and quality of life.  Baseline: EVAL=54 Goal status: INITIAL   3.  Patient will increase Berg Balance score by > 4 points to demonstrate decreased fall risk during functional activities. Baseline: 45 Goal status: INITIAL   4.   Patient will reduce timed up and go to <11 seconds to reduce fall risk and demonstrate improved transfer/gait ability. Baseline: EVAL=13.99 sec with cane Goal status: INITIAL  5.   Patient will increase 10 meter walk test to >1.73m/s as to improve gait speed for better community ambulation and  to reduce fall risk. Baseline: EVAL= 0.58 m/s Goal status: INITIAL  6.   Patient will increase six minute walk test distance to >1000 for progression to community ambulator and improve gait ability Baseline: EVAL: to be assessed 2nd visit 10/29: 715 ft  Goal status: INITIAL   ASSESSMENT:  CLINICAL IMPRESSION: Patient participated in treatment session focusing on LE strengthening and balance. With performance of dual task and balance activities, there is noticeable trembling of LE's indicating a need to progress balance. He is demonstrating improvements in single leg balance evidenced by the ability to stand on one leg and tap a blaze pod without UE support. He will benefit from skilled PT  services to improve his functional strength/mobility and overall balance for improved quality of life and decreased risk of falling.   OBJECTIVE IMPAIRMENTS: Abnormal gait, decreased activity tolerance, decreased balance, decreased cognition, decreased coordination, decreased endurance, decreased mobility, difficulty walking, decreased strength, and impaired flexibility.   ACTIVITY LIMITATIONS: carrying, lifting, bending, sitting, standing, squatting, stairs, and transfers  PARTICIPATION LIMITATIONS: cleaning, laundry, shopping, community activity, and yard work  PERSONAL FACTORS: Age and 1-2 comorbidities: COPD, HTN  are also affecting patient's functional outcome.   REHAB POTENTIAL: Good  CLINICAL DECISION MAKING: Evolving/moderate complexity  EVALUATION COMPLEXITY: Moderate  PLAN:  PT FREQUENCY: 1-2x/week  PT DURATION: 12 weeks  PLANNED INTERVENTIONS: 97164- PT Re-evaluation, 97110-Therapeutic exercises, 97530- Therapeutic activity, 97112- Neuromuscular re-education, 97535- Self Care, 96295- Manual therapy, [phone]- Gait training, [phone]- Orthotic Fit/training, [phone]- Canalith repositioning, [phone]- Electrical stimulation (manual), Patient/Family education, Balance training, Stair training, Taping, Dry Needling, Joint mobilization, Spinal mobilization, Vestibular training, Visual/preceptual remediation/compensation, DME instructions, Cryotherapy, and Moist heat  PLAN FOR NEXT SESSION:  balance and LE strengthening    Randon Goldsmith, Student-PT 03/10/2023, 2:36 PM

## 2023-03-11 ENCOUNTER — Ambulatory Visit: Payer: No Typology Code available for payment source

## 2023-03-11 DIAGNOSIS — I480 Paroxysmal atrial fibrillation: Secondary | ICD-10-CM | POA: Diagnosis not present

## 2023-03-11 DIAGNOSIS — Z7901 Long term (current) use of anticoagulants: Secondary | ICD-10-CM | POA: Diagnosis not present

## 2023-03-11 DIAGNOSIS — Z5181 Encounter for therapeutic drug level monitoring: Secondary | ICD-10-CM | POA: Diagnosis not present

## 2023-03-11 NOTE — Progress Notes (Signed)
Patient for IR PAE on Wed 03/12/2023, I called and spoke with the patient's wife, Rosann Auerbach on the phone and gave pre-procedure instructions. Rosann Auerbach was made aware to have the patient here at 7:30a, last dose of Coumadin was Friday, 03/07/2023, NPO after MN prior to procedure as well as driver post procedure/recovery/discharge. Trish stated understanding.  Called 03/05/2023 and LVM 03/11/2023

## 2023-03-12 ENCOUNTER — Other Ambulatory Visit: Payer: Self-pay

## 2023-03-12 ENCOUNTER — Ambulatory Visit
Admission: RE | Admit: 2023-03-12 | Discharge: 2023-03-12 | Disposition: A | Discharge status: Home / Self Care | Payer: No Typology Code available for payment source | Source: Ambulatory Visit | Attending: Interventional Radiology | Admitting: Interventional Radiology

## 2023-03-12 VITALS — BP 148/93 | HR 84 | Temp 97.3°F | Resp 23 | Ht 65.0 in | Wt 200.0 lb

## 2023-03-12 DIAGNOSIS — Z8249 Family history of ischemic heart disease and other diseases of the circulatory system: Secondary | ICD-10-CM | POA: Insufficient documentation

## 2023-03-12 DIAGNOSIS — I129 Hypertensive chronic kidney disease with stage 1 through stage 4 chronic kidney disease, or unspecified chronic kidney disease: Secondary | ICD-10-CM | POA: Insufficient documentation

## 2023-03-12 DIAGNOSIS — Z87891 Personal history of nicotine dependence: Secondary | ICD-10-CM | POA: Insufficient documentation

## 2023-03-12 DIAGNOSIS — N1832 Chronic kidney disease, stage 3b: Secondary | ICD-10-CM | POA: Diagnosis not present

## 2023-03-12 DIAGNOSIS — R338 Other retention of urine: Secondary | ICD-10-CM | POA: Diagnosis not present

## 2023-03-12 DIAGNOSIS — N401 Enlarged prostate with lower urinary tract symptoms: Secondary | ICD-10-CM | POA: Diagnosis not present

## 2023-03-12 DIAGNOSIS — Z7901 Long term (current) use of anticoagulants: Secondary | ICD-10-CM | POA: Diagnosis not present

## 2023-03-12 DIAGNOSIS — I482 Chronic atrial fibrillation, unspecified: Secondary | ICD-10-CM | POA: Diagnosis not present

## 2023-03-12 HISTORY — PX: IR EMBO TUMOR ORGAN ISCHEMIA INFARCT INC GUIDE ROADMAPPING: IMG5449

## 2023-03-12 HISTORY — PX: IR EMBO ARTERIAL NOT HEMORR HEMANG INC GUIDE ROADMAPPING: IMG5448

## 2023-03-12 LAB — CBC
HCT: 44.5 % (ref 39.0–52.0)
Hemoglobin: 14.1 g/dL (ref 13.0–17.0)
MCH: 28.7 pg (ref 26.0–34.0)
MCHC: 31.7 g/dL (ref 30.0–36.0)
MCV: 90.4 fL (ref 80.0–100.0)
Platelets: 201 10*3/uL (ref 150–400)
RBC: 4.92 MIL/uL (ref 4.22–5.81)
RDW: 16 % — ABNORMAL HIGH (ref 11.5–15.5)
WBC: 8.7 10*3/uL (ref 4.0–10.5)
nRBC: 0 % (ref 0.0–0.2)

## 2023-03-12 LAB — BASIC METABOLIC PANEL
Anion gap: 7 (ref 5–15)
BUN: 41 mg/dL — ABNORMAL HIGH (ref 8–23)
CO2: 28 mmol/L (ref 22–32)
Calcium: 9.3 mg/dL (ref 8.9–10.3)
Chloride: 103 mmol/L (ref 98–111)
Creatinine, Ser: 2.16 mg/dL — ABNORMAL HIGH (ref 0.61–1.24)
GFR, Estimated: 29 mL/min — ABNORMAL LOW (ref >60)
Glucose, Bld: 103 mg/dL — ABNORMAL HIGH (ref 70–99)
Potassium: 4.4 mmol/L (ref 3.5–5.1)
Sodium: 138 mmol/L (ref 135–145)

## 2023-03-12 LAB — PROTIME-INR
INR: 1.4 — ABNORMAL HIGH (ref 0.8–1.2)
Prothrombin Time: 17.1 s — ABNORMAL HIGH (ref 11.4–15.2)

## 2023-03-12 MED ORDER — PHENAZOPYRIDINE HCL 100 MG PO TABS
100.0000 mg | ORAL_TABLET | Freq: Once | ORAL | Status: AC
  Administered 2023-03-12: 100 mg via ORAL
  Filled 2023-03-12 (×2): qty 1

## 2023-03-12 MED ORDER — CIPROFLOXACIN HCL 500 MG PO TABS: 500.0000 mg | ORAL_TABLET | Freq: Every day | ORAL | 0 refills | Status: AC

## 2023-03-12 MED ORDER — DIPHENHYDRAMINE HCL 50 MG/ML IJ SOLN
INTRAMUSCULAR | Status: AC | PRN
  Administered 2023-03-12: 25 mg via INTRAVENOUS

## 2023-03-12 MED ORDER — MIDAZOLAM HCL 2 MG/2ML IJ SOLN
INTRAMUSCULAR | Status: AC | PRN
  Administered 2023-03-12 (×3): .5 mg via INTRAVENOUS
  Administered 2023-03-12: 1 mg via INTRAVENOUS

## 2023-03-12 MED ORDER — ACETAMINOPHEN 500 MG PO TABS
ORAL_TABLET | ORAL | Status: AC
  Administered 2023-03-12: 1000 mg via ORAL
  Filled 2023-03-12: qty 2

## 2023-03-12 MED ORDER — SOLIFENACIN SUCCINATE 5 MG PO TABS: 5.0000 mg | ORAL_TABLET | Freq: Every day | ORAL | 0 refills | Status: AC

## 2023-03-12 MED ORDER — FENTANYL CITRATE (PF) 100 MCG/2ML IJ SOLN
INTRAMUSCULAR | Status: AC | PRN
  Administered 2023-03-12: 25 ug via INTRAVENOUS
  Administered 2023-03-12: 50 ug via INTRAVENOUS
  Administered 2023-03-12 (×2): 25 ug via INTRAVENOUS

## 2023-03-12 MED ORDER — NAPROXEN 500 MG PO TABS
500.0000 mg | ORAL_TABLET | Freq: Once | ORAL | Status: AC
  Administered 2023-03-12: 500 mg via ORAL
  Filled 2023-03-12 (×2): qty 1

## 2023-03-12 MED ORDER — LIDOCAINE HCL 1 % IJ SOLN
INTRAMUSCULAR | Status: AC
  Filled 2023-03-12: qty 20

## 2023-03-12 MED ORDER — MIDAZOLAM HCL 2 MG/2ML IJ SOLN
INTRAMUSCULAR | Status: AC
  Filled 2023-03-12: qty 2

## 2023-03-12 MED ORDER — FENTANYL CITRATE (PF) 100 MCG/2ML IJ SOLN
INTRAMUSCULAR | Status: AC
  Filled 2023-03-12: qty 2

## 2023-03-12 MED ORDER — NITROGLYCERIN 1 MG/10 ML FOR IR/CATH LAB
INTRA_ARTERIAL | Status: AC | PRN
  Administered 2023-03-12: 100 ug via INTRA_ARTERIAL
  Administered 2023-03-12 (×2): 200 ug via INTRA_ARTERIAL

## 2023-03-12 MED ORDER — NITROGLYCERIN 1 MG/10 ML FOR IR/CATH LAB
INTRA_ARTERIAL | Status: AC
  Filled 2023-03-12: qty 10

## 2023-03-12 MED ORDER — PHENAZOPYRIDINE HCL 100 MG PO TABS: 100.0000 mg | ORAL_TABLET | Freq: Three times a day (TID) | ORAL | 0 refills | Status: AC

## 2023-03-12 MED ORDER — NITROGLYCERIN 1 MG/10 ML FOR IR/CATH LAB
INTRA_ARTERIAL | Status: AC | PRN
  Administered 2023-03-12: 200 ug

## 2023-03-12 MED ORDER — ACETAMINOPHEN 500 MG PO TABS
1000.0000 mg | ORAL_TABLET | Freq: Once | ORAL | Status: AC
  Filled 2023-03-12: qty 2

## 2023-03-12 MED ORDER — LIDOCAINE HCL 1 % IJ SOLN
2.0000 mL | Freq: Once | INTRAMUSCULAR | Status: AC
  Administered 2023-03-12: 2 mL via INTRADERMAL

## 2023-03-12 MED ORDER — SULFAMETHOXAZOLE-TRIMETHOPRIM 400-80 MG PO TABS
1.0000 | ORAL_TABLET | Freq: Once | ORAL | Status: AC
  Administered 2023-03-12: 1 via ORAL
  Filled 2023-03-12 (×2): qty 1

## 2023-03-12 MED ORDER — IOHEXOL 300 MG/ML  SOLN
110.0000 mL | Freq: Once | INTRAMUSCULAR | Status: AC | PRN
  Administered 2023-03-12: 110 mL via INTRA_ARTERIAL

## 2023-03-12 MED ORDER — MIDAZOLAM HCL 2 MG/2ML IJ SOLN
INTRAMUSCULAR | Status: AC
Start: 2023-03-12 — End: ?
  Filled 2023-03-12: qty 2

## 2023-03-12 MED ORDER — OXYCODONE-ACETAMINOPHEN 5-325 MG PO TABS: 1.0000 | ORAL_TABLET | ORAL | 0 refills | Status: AC | PRN

## 2023-03-12 MED ORDER — DIPHENHYDRAMINE HCL 50 MG/ML IJ SOLN
INTRAMUSCULAR | Status: AC
Start: 2023-03-12 — End: ?
  Filled 2023-03-12: qty 1

## 2023-03-12 MED ORDER — OXYBUTYNIN CHLORIDE 5 MG PO TABS
5.0000 mg | ORAL_TABLET | Freq: Once | ORAL | Status: AC
  Administered 2023-03-12: 5 mg via ORAL
  Filled 2023-03-12: qty 1

## 2023-03-12 NOTE — Procedures (Signed)
Interventional Radiology Procedure Note  Procedure: Prostatic artery embolization  Complications: None  Estimated Blood Loss: None  Recommendations: - DC home   Signed,  Sterling Big, MD

## 2023-03-12 NOTE — H&P (Signed)
Chief Complaint: Patient was seen in consultation today for BPH; urinary retention.   Referring Physician(s): Stoioff,Scott C   Supervising Physician: Malachy Moan  Patient Status: [facility] - Out-pt  History of Present Illness: Craig Gilmore is an 88 y.o. male with a medical history significant for chronic atrial fibrillation on warfarin, COPD, depression, sick sinus syndrome, gout, thoracic aortic aneurysm, chronic kidney disease stage 3b and BPH. He was hospitalized in September 2024 with sepsis secondary to pneumonia and he experienced issues with urinary retention during that stay. He was initiated on tamsulosin and a foley catheter was placed. At the time of discharge the foley catheter was removed and he initially did well but unfortunately a bladder scan on follow up showed a significant post-void residual (586 ml) necessitating replacement of the foley catheter. He has a longstanding history of lower urinary tract symptoms including urinary frequency, postvoid dribbling and having to get up multiple times throughout the night to void.   Due to his age and medical comorbidities he is not an optimal surgical candidate for treatment of his BPH and he was referred to Interventional Radiology to discuss endovascular options. The patient and his wife met with Dr. Archer Asa 02/25/23. He had remained foley catheter dependent since his hospitalization in September and this was causing him discomfort and inconvenience. Dr. Archer Asa discussed the risks, benefits and alternatives of prostate artery embolization and that the patient was an optimal candidate for this therapy. The patient and his wife expressed a desire to proceed.    Past Medical History:  Diagnosis Date   Chronic atrial fibrillation (HCC)    a.) CHA2DS2VASc = 3 (age x 2, HTN);  b.) s/p DCCV 08/20/2004 --> 50 J x 1 and 100 J x 1 --> coverted with SB. (+) post-procedure nausea; c.) rate/rhythm maintained without pharmacological  intervention; chronically anticoagulated with warfarin   Colonic polyp    COPD (chronic obstructive pulmonary disease) (HCC)    Depression    Dyspnea    Edema    GERD (gastroesophageal reflux disease)    Gout    Hemorrhoids    Hyperlipidemia    Hypertension    Insomnia    a.) on hypnotic (zolpidem)   Long term current use of anticoagulant    a.) warfarin   OSA on CPAP    Pneumonia    Pulmonary HTN (HCC) 02/14/2016   a.) TTE 02/14/2016: RVSP 37.5 mmHg   Sick sinus syndrome (HCC)    Squamous cell carcinoma in situ 10/03/2009   L medial infrapectoral   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   L nose supratip   Stage 3b chronic kidney disease (CKD) (HCC)     Past Surgical History:  Procedure Laterality Date   CARDIOVERSION N/A 08/21/2004   Procedure: CIRECT CURRENT CARDIOVERSION; Location: [facility]; Surgeon: Harold Hedge, MD   CATARACT EXTRACTION, BILATERAL     CHOLECYSTECTOMY     COLONOSCOPY     IR RADIOLOGIST EVAL & MGMT  02/25/2023   NASAL SINUS SURGERY      Allergies: Baycol [cerivastatin] and Pravastatin  Medications: Prior to Admission medications   Medication Sig Start Date End Date Taking? Authorizing Provider  albuterol (VENTOLIN HFA) 108 (90 Base) MCG/ACT inhaler Inhale 2 puffs into the lungs 2 (two) times daily. 01/27/23 01/27/24  [provider]  azithromycin (ZITHROMAX) 250 MG tablet Take 250 mg by mouth daily.    [provider]  buPROPion (WELLBUTRIN XL) 150 MG 24 hr tablet Take 75 mg by mouth  every morning.    [provider]  Choline Fenofibrate (FENOFIBRIC ACID) 135 MG CPDR Take 1 capsule by mouth  daily Patient taking differently: Take 1 tablet by mouth every evening. Take 1 capsule by mouth  daily 05/18/15   Iran Ouch, MD  dutasteride (AVODART) 0.5 MG capsule Take 1 capsule (0.5 mg total) by mouth daily. 01/31/23   Vaillancourt, Lelon Mast, PA-C  febuxostat (ULORIC) 40 MG tablet Take 40 mg by mouth every evening.     [provider]  fluticasone (FLONASE) 50 MCG/ACT nasal spray Place 1 spray into both nostrils at bedtime.    [provider]  furosemide (LASIX) 20 MG tablet Take 1 tablet (20 mg total) by mouth daily as needed for fluid or edema (Take 1 tablet for worsening leg swelling, shortness of breath, or weight gain of greater than 3 lbs.). 12/21/22   Marcelino Duster, MD  ipratropium (ATROVENT) 0.03 % nasal spray Place 2 sprays into both nostrils every morning.    [provider]  levocetirizine (XYZAL) 5 MG tablet Take 5 mg by mouth every evening.    [provider]  omeprazole (PRILOSEC OTC) 20 MG tablet Take 20 mg by mouth daily before breakfast.    [provider]  prednisoLONE 5 MG TABS tablet Take 5 mg by mouth daily.    [provider]  predniSONE (DELTASONE) 5 MG tablet Take 5 mg by mouth daily. 01/27/23 04/27/23  [provider]  sertraline (ZOLOFT) 100 MG tablet Take 150 mg by mouth every morning.    [provider]  tamsulosin (FLOMAX) 0.4 MG CAPS capsule Take 1 capsule (0.4 mg total) by mouth daily after supper. 12/20/22   Marcelino Duster, MD  warfarin (COUMADIN) 2.5 MG tablet Take 1 tablet (2.5 mg total) by mouth every evening. Take as directed by anticoagulation clinic 12/23/22   Marcelino Duster, MD  zolpidem (AMBIEN) 10 MG tablet Take 10 mg by mouth at bedtime.    [provider]     Family History  Problem Relation Age of Onset   Diabetes Mother    Heart disease Mother    Heart disease Father    Aneurysm Father     Social History   Socioeconomic History   Marital status: Married    Spouse name: Not on file   Number of children: Not on file   Years of education: Not on file   Highest education level: Not on file  Occupational History   Not on file  Tobacco Use   Smoking status: Former    Current packs/day: 0.00    Average packs/day: 1.5 packs/day for 50.0 years (75.0 ttl pk-yrs)     Types: Cigarettes    Start date: 04/09/1939    Quit date: 04/08/1989    Years since quitting: 33.9   Smokeless tobacco: Never  Vaping Use   Vaping status: Never Used  Substance and Sexual Activity   Alcohol use: No   Drug use: No   Sexual activity: Not on file  Other Topics Concern   Not on file  Social History Narrative   Not on file   Social Determinants of Health   Financial Resource Strain: Low Risk  (05/27/2022)   Received from [facility]   Overall Financial Resource Strain (CARDIA)    Difficulty of Paying Living Expenses: Not very hard  Food Insecurity: No Food Insecurity (01/22/2023)   Hunger Vital Sign    Worried About Running Out of  Food in the Last Year: Never true    Ran Out of Food in the Last Year: Never true  Transportation Needs: No Transportation Needs (01/22/2023)   PRAPARE - Administrator, Civil Service (Medical): No    Lack of Transportation (Non-Medical): No  Physical Activity: Not on file  Stress: Not on file  Social Connections: Not on file    Review of Systems: A 12 point ROS discussed and pertinent positives are indicated in the HPI above.  All other systems are negative.  Review of Systems  Constitutional:  Negative for appetite change and fatigue.  Respiratory:  Negative for cough and shortness of breath.   Cardiovascular:  Negative for chest pain and leg swelling.  Gastrointestinal:  Negative for abdominal pain, diarrhea, nausea and vomiting.  Genitourinary:  Positive for difficulty urinating.  Musculoskeletal:  Negative for back pain.  Neurological:  Positive for dizziness.    Vital Signs: BP (!) 167/93 (BP Location: Right Arm)   Pulse 72   Temp (!) 97.3 F (36.3 C) (Oral)   Resp (!) 23   Ht 5\' 5"  (1.651 m)   Wt 199 lb 15.3 oz (90.7 kg)   SpO2 96%   BMI 33.27 kg/m   Physical Exam Constitutional:      General: He is not in acute distress.    Appearance: He is not  ill-appearing.  HENT:     Mouth/Throat:     Mouth: Mucous membranes are moist.     Pharynx: Oropharynx is clear.  Cardiovascular:     Rate and Rhythm: Normal rate. Rhythm irregular.     Pulses: Normal pulses.  Pulmonary:     Effort: Pulmonary effort is normal.  Abdominal:     Tenderness: There is no abdominal tenderness.  Genitourinary:    Comments: Foley catheter in place. Yellow urine in leg bag.  Musculoskeletal:     Right lower leg: No edema.     Left lower leg: No edema.  Skin:    General: Skin is warm and dry.  Neurological:     Mental Status: He is alert and oriented to person, place, and time.  Psychiatric:        Mood and Affect: Mood normal.        Behavior: Behavior normal.        Thought Content: Thought content normal.        Judgment: Judgment normal.     Imaging: IR Radiologist Eval & Mgmt  Result Date: 02/25/2023 EXAM: NEW PATIENT OFFICE VISIT CHIEF COMPLAINT: SEE NOTE IN EPIC HISTORY OF PRESENT ILLNESS: SEE NOTE IN EPIC REVIEW OF SYSTEMS: SEE NOTE IN EPIC PHYSICAL EXAMINATION: SEE NOTE IN EPIC ASSESSMENT AND PLAN: SEE NOTE IN EPIC Electronically Signed   By: Malachy Moan M.D.   On: 02/25/2023 14:05    Labs:  CBC: Recent Labs    12/18/22 0341 12/19/22 0630 12/20/22 0528 03/12/23 0800  WBC 9.1 14.1* 11.3* 8.7  HGB 13.0 13.8 13.2 14.1  HCT 39.9 41.0 40.2 44.5  PLT 231 260 250 201    COAGS: Recent Labs    12/17/22 0506 12/18/22 0341 12/19/22 0630 12/20/22 0528  INR 2.8* 3.4* 3.4* 3.6*    BMP: Recent Labs    12/18/22 0341 12/19/22 0630 12/20/22 0528 03/12/23 0800  NA 134* 133* 133* 138  K 3.5 3.5 3.7 4.4  CL 105 99 101 103  CO2 19* 19* 20* 28  GLUCOSE 98 105* 99 103*  BUN 33* 35* 37* 41*  CALCIUM 8.5* 9.1 8.7* 9.3  CREATININE 1.61* 1.79* 1.82* 2.16*  GFRNONAA 41* 36* 36* 29*    LIVER FUNCTION TESTS: Recent Labs    12/16/22 1907 12/17/22 0506  BILITOT 1.0 0.9  AST 36 29  ALT 23 18  ALKPHOS 69 53  PROT 7.8 6.4*   ALBUMIN 4.0 3.3*    TUMOR MARKERS: No results for input(s): "AFPTM", "CEA", "CA199", "CHROMGRNA" in the last 8760 hours.  Assessment and Plan:  Benign prostatic hyperplasia with lower urinary tract symptoms; urinary retention with foley catheter dependence: Craig Gilmore, 88 year old male, presents today to the [facility] Interventional Radiology department for an image-guided prostate artery embolization.   Risks and benefits of prostate artery embolization were discussed with the patient including, but not limited to bleeding, infection, vascular injury or contrast induced renal failure.  This interventional procedure involves the use of X-rays and because of the nature of the planned procedure, it is possible that we will have prolonged use of X-ray fluoroscopy.  Potential radiation risks to you include (but are not limited to) the following: - A slightly elevated risk for cancer  several years later in life. This risk is typically less than 0.5% percent. This risk is low in comparison to the normal incidence of human cancer, which is 33% for women and 50% for men according to the American [employer] Society. - Radiation induced injury can include skin redness, resembling a rash, tissue breakdown / ulcers and hair loss (which can be temporary or permanent).   The likelihood of either of these occurring depends on the difficulty of the procedure and whether you are sensitive to radiation due to previous procedures, disease, or genetic conditions.   IF your procedure requires a prolonged use of radiation, you will be notified and given written instructions for further action.  It is your responsibility to monitor the irradiated area for the 2 weeks following the procedure and to notify your physician if you are concerned that you have suffered a radiation induced injury.    All of the patient's questions were answered, patient is agreeable to proceed. He has been NPO.  He is a full code. His last dose of warfarin was Friday 03/07/23.   Consent signed and in chart.  Thank you for this interesting consult.  I greatly enjoyed meeting Craig Gilmore and look forward to participating in their care.  A copy of this report was sent to the requesting provider on this date.  Electronically Signed: Alwyn Ren, AGACNP-BC [phone] 03/12/2023, 8:31 AM   I spent a total of  30 Minutes   in face to face in clinical consultation, greater than 50% of which was counseling/coordinating care for benign prostatic hyperplasia.

## 2023-03-13 ENCOUNTER — Other Ambulatory Visit: Payer: Self-pay | Admitting: Interventional Radiology

## 2023-03-13 ENCOUNTER — Ambulatory Visit: Payer: No Typology Code available for payment source

## 2023-03-13 DIAGNOSIS — N401 Enlarged prostate with lower urinary tract symptoms: Secondary | ICD-10-CM

## 2023-03-14 ENCOUNTER — Encounter: Payer: Self-pay | Admitting: Physician Assistant

## 2023-03-14 ENCOUNTER — Ambulatory Visit (INDEPENDENT_AMBULATORY_CARE_PROVIDER_SITE_OTHER): Payer: No Typology Code available for payment source | Admitting: Urology

## 2023-03-14 VITALS — BP 138/78 | HR 114 | Ht 65.0 in | Wt 199.0 lb

## 2023-03-14 DIAGNOSIS — R339 Retention of urine, unspecified: Secondary | ICD-10-CM

## 2023-03-14 DIAGNOSIS — N3289 Other specified disorders of bladder: Secondary | ICD-10-CM | POA: Diagnosis not present

## 2023-03-14 DIAGNOSIS — N401 Enlarged prostate with lower urinary tract symptoms: Secondary | ICD-10-CM

## 2023-03-14 MED ORDER — CIPROFLOXACIN HCL 500 MG PO TABS: 500.0000 mg | ORAL_TABLET | Freq: Once | ORAL | Status: DC

## 2023-03-14 NOTE — Progress Notes (Unsigned)
Attempted Cath Change/ Replacement  Patient is present today for a catheter change due to urinary retention.  8ml of water was removed from the balloon, a 16FR coude foley cath was removed without difficulty.  Patient was cleaned and prepped in a sterile fashion with betadine and 2% lidocaine jelly was instilled into the urethra. A 18 FR coude foley cath was inserted into the urethra but significant resistance was met at the level of the prostate such that I was unable to pass it into the bladder. At this point I requested Dr. Heywood Footman assistance with Foley replacement; see separate note.  Performed by: Carman Ching, PA-C   Follow up: Return in about 4 weeks (around 04/11/2023) for Voiding trial.

## 2023-03-14 NOTE — Telephone Encounter (Signed)
Spoke with patient and Mrs Koob and worked in today

## 2023-03-16 NOTE — Progress Notes (Unsigned)
   03/16/23  CC:  Chief Complaint  Patient presents with   Procedure    HPI:  Blood pressure 138/78, pulse (!) 114, height 5\' 5"  (1.651 m), weight 199 lb (90.3 kg). NED. A&Ox3.   No respiratory distress   Abd soft, NT, ND Normal phallus with bilateral descended testicles  Cystoscopy Procedure Note  Patient identification was confirmed, informed consent was obtained, and patient was prepped using Betadine solution.  Lidocaine jelly was administered per urethral meatus.     Pre-Procedure: - Inspection reveals a normal caliber ureteral meatus.  Procedure: The flexible cystoscope was introduced without difficulty - No urethral strictures/lesions are present. - {Blank multiple:19197::"Enlarged","Surgically absent","Normal"} prostate *** - {Blank multiple:19197::"Normal","Elevated","Tight"} bladder neck - Bilateral ureteral orifices identified - Bladder mucosa  reveals no ulcers, tumors, or lesions - No bladder stones - No trabeculation  Retroflexion shows ***   Post-Procedure: - Patient tolerated the procedure well  Assessment/ Plan:   Return in about 4 weeks (around 04/11/2023) for Voiding trial.  Riki Altes, MD

## 2023-03-17 ENCOUNTER — Encounter: Payer: Self-pay | Admitting: Urology

## 2023-03-18 ENCOUNTER — Ambulatory Visit: Payer: No Typology Code available for payment source | Attending: Family Medicine

## 2023-03-18 DIAGNOSIS — R2689 Other abnormalities of gait and mobility: Secondary | ICD-10-CM | POA: Insufficient documentation

## 2023-03-18 DIAGNOSIS — R2681 Unsteadiness on feet: Secondary | ICD-10-CM

## 2023-03-18 DIAGNOSIS — M6281 Muscle weakness (generalized): Secondary | ICD-10-CM | POA: Diagnosis present

## 2023-03-18 DIAGNOSIS — R262 Difficulty in walking, not elsewhere classified: Secondary | ICD-10-CM | POA: Diagnosis present

## 2023-03-18 DIAGNOSIS — R269 Unspecified abnormalities of gait and mobility: Secondary | ICD-10-CM

## 2023-03-18 DIAGNOSIS — R278 Other lack of coordination: Secondary | ICD-10-CM

## 2023-03-18 NOTE — Therapy (Signed)
OUTPATIENT PHYSICAL THERAPY NEURO TREATMENT   Patient Name: Craig Gilmore MRN: [id number] DOB:10/24/1934, 88 y.o., male Today's Date: 03/18/2023   PCP: Dr. Jerl Mina   REFERRING PROVIDER: Dr. Jerl Mina  END OF SESSION:  PT End of Session - 03/18/23 1630     Visit Number 7    Number of Visits 24    Date for PT Re-Evaluation 04/24/23    Progress Note Due on Visit 10    PT Start Time 1315    PT Stop Time 1359    PT Time Calculation (min) 44 min    Equipment Utilized During Treatment Gait belt    Activity Tolerance Patient tolerated treatment well    Behavior During Therapy WFL for tasks assessed/performed                  Past Medical History:  Diagnosis Date   Chronic atrial fibrillation (HCC)    a.) CHA2DS2VASc = 3 (age x 2, HTN);  b.) s/p DCCV 08/20/2004 --> 50 J x 1 and 100 J x 1 --> coverted with SB. (+) post-procedure nausea; c.) rate/rhythm maintained without pharmacological intervention; chronically anticoagulated with warfarin   Colonic polyp    COPD (chronic obstructive pulmonary disease) (HCC)    Depression    Dyspnea    Edema    GERD (gastroesophageal reflux disease)    Gout    Hemorrhoids    Hyperlipidemia    Hypertension    Insomnia    a.) on hypnotic (zolpidem)   Long term current use of anticoagulant    a.) warfarin   OSA on CPAP    Pneumonia    Pulmonary HTN (HCC) 02/14/2016   a.) TTE 02/14/2016: RVSP 37.5 mmHg   Sick sinus syndrome (HCC)    Squamous cell carcinoma in situ 10/03/2009   L medial infrapectoral   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   L nose supratip   Stage 3b chronic kidney disease (CKD) (HCC)    Past Surgical History:  Procedure Laterality Date   CARDIOVERSION N/A 08/21/2004   Procedure: CIRECT CURRENT CARDIOVERSION; Location: [facility]; Surgeon: Harold Hedge, MD   CATARACT EXTRACTION, BILATERAL     CHOLECYSTECTOMY     COLONOSCOPY     IR EMBO TUMOR ORGAN ISCHEMIA INFARCT INC GUIDE ROADMAPPING  03/12/2023    IR RADIOLOGIST EVAL & MGMT  02/25/2023   NASAL SINUS SURGERY     Patient Active Problem List   Diagnosis Date Noted   Hyponatremia 12/19/2022   Sick sinus syndrome (HCC) 12/16/2022   Interstitial lung disease (HCC) 12/16/2022   Thoracic aortic aneurysm (HCC) 12/16/2022   Sepsis due to pneumonia (HCC) 12/16/2022   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 tophus 02/07/2022   Stage 3b chronic kidney disease (HCC) 02/07/2022   Hyperlipidemia 05/04/2015   Encounter for therapeutic drug monitoring 11/24/2013   Exertional dyspnea 11/23/2013   Chronic atrial fibrillation (HCC)    Pneumonia, organism unspecified(486) 08/17/2012    ONSET DATE: 2-3  REFERRING DIAG:  R26.81 (ICD-10-CM) - Unsteadiness on feet  R68.89 (ICD-10-CM) - Other general symptoms and signs  Z87.01 (ICD-10-CM) - Personal history of pneumonia (recurrent)    THERAPY DIAG:  Unsteadiness on feet  Abnormality of gait and mobility  Muscle weakness (generalized)  Other lack of coordination  Difficulty in walking, not elsewhere classified  Rationale for Evaluation and Treatment: Rehabilitation  SUBJECTIVE:  SUBJECTIVE STATEMENT: Pt presents in good spirits and is accompanied by family. Pt presents with slight tremors. Wife reports changes to medications including percocet from interventional radiologist and new blood pressure medication (amlodipine).   Pt denies reports of fall, but wife states he has had one fall when he was helping put dishes in the dishwasher and fell forward. She states his hip was slightly bruised afterwards but he was fine.     Pt accompanied by:  wife- [name] and son  PERTINENT HISTORY: Patient is a 88 year old male with referall from PCP for imbalance and past medical history includes a recent  hospitalization- 12/16/2022- 5 days for sepsis and PNA. PMH includes renal lesion, A-Fib, COPD, Depression, HTN, GOUT, Sleep apnea,  and HLD.  Wife reports he has been having balance issues for 2-3 years but having more unsteadiness recently  PAIN:  Are you having pain? No  PRECAUTIONS: Fall  RED FLAGS: None   WEIGHT BEARING RESTRICTIONS: No  FALLS: Has patient fallen in last 6 months?  No falls but several near falls.   LIVING ENVIRONMENT: Lives with: lives with their family- wife (son stays during the day)  Lives in: House/apartment Stairs: Yes: External: 3 steps; on right going up Has following equipment at home: Single point cane, Rollator  PLOF: Requires assistive device for independence and Needs assistance with ADLs  PATIENT GOALS: To improve my balance and not fall  OBJECTIVE:  Note: Objective measures were completed at Evaluation unless otherwise noted.  DIAGNOSTIC FINDINGS:  CT SCAN- 12/16/2022 IMPRESSION: 1. No evidence for aortic dissection or aneurysm. 2. Ascending thoracic aortic aneurysm measuring 4.3 x 3.7 cm. Recommend annual imaging followup by CTA or MRA. This recommendation follows 2010 ACCF/AHA/AATS/ACR/ASA/SCA/SCAI/SIR/STS/SVM Guidelines for the Diagnosis and Management of Patients with Thoracic Aortic Disease. Circulation. 2010; 121: Z610-R604. Aortic aneurysm NOS (ICD10-I71.9) 3. Pathologic mediastinal and right hilar lymphadenopathy of uncertain etiology. 4. Findings compatible with interstitial lung disease. 5. Ground-glass opacity in the right lower lobe measuring 3.3 cm. This may be infectious/inflammatory, but neoplasm can not be excluded. 6. 4.8 cm indeterminate right renal lesion, possibly complex cysts. Recommend further evaluation with dedicated CT or MRI. 7. Bladder diverticulum. 8. Colonic diverticulosis. 9. Prostatomegaly. 10. Small fat containing left inguinal hernia.     Electronically Signed   By: Darliss Cheney M.D.   On:  12/16/2022 23:30  COGNITION: Overall cognitive status: Impaired- forgetful   SENSATION: WFL  COORDINATION: Delayed at times with BLE  EDEMA:  None observed   POSTURE: rounded shoulders and forward head    LOWER EXTREMITY MMT:    MMT Right Eval Left Eval  Hip flexion 4 4  Hip extension    Hip abduction 4 4  Hip adduction 4 4  Hip internal rotation 4 4  Hip external rotation 4 4  Knee flexion 4 4  Knee extension 4 4  Ankle dorsiflexion 4 4  Ankle plantarflexion    Ankle inversion    Ankle eversion    (Blank rows = not tested)   TRANSFERS: Assistive device utilized: Single point cane  Sit to stand: Modified independence Stand to sit: Modified independence Chair to chair: Modified independence Floor:  Not tested   GAIT: Gait pattern: decreased arm swing- Right, decreased arm swing- Left, decreased step length- Right, decreased step length- Left, decreased stance time- Right, decreased stance time- Left, and decreased stride length Distance walked: > 200 feet in total Assistive device utilized: Single point cane Level of assistance: CGA Comments: decreased  gait speed and VC to pick up feet  FUNCTIONAL TESTS:  5 times sit to stand: 22.68 sec withuout  Timed up and go (TUG): 13.99 sec with cane 6 minute walk test: To be performed next visit 10 meter walk test: 0.59 m/s Berg Balance Scale: 45/56  PATIENT SURVEYS:  FOTO 54  TODAY'S TREATMENT:                                                                                                                              DATE: 03/18/23  TherEx: -STS with orange medicine ball 2x10 -Lateral step ups 2x20 each LE with 4#aw, pt with cues to take large lateral step on/off step, UE assist, rest break required after each set  Blood pressure following rest after first set of step ups: BP: 153/84 mmHg Hr: 86  NMR:  -Ambulation x150 ft with cane  -Resistive gait x300 ft with 3#aw and cane  -Forwards walking 35 ft x2  without AD, no LOB -Backwards walking 35 ft x2 with cane, limited hip extension, decreased step length  -Backwards waking 35 ft x2 without AD, small instances of LOB when narrowing BOS or attempting to turn, pt uses stepping strategy across midline to recover and min assist form SPT -Forwards walking 35 ft x2 with ball toss, no LOB, decreased cadence throughout   -Obstacle course- Pt  weavng between five cones, standing on airex 30s, ten foot taps on hedge hog without UE support   Post session BP: 137/85 mmHg HR: 75  PATIENT EDUCATION: Education details: PT plan of care; goals Person educated: Patient Education method: Explanation Education comprehension: verbalized understanding  HOME EXERCISE PROGRAM: Access Code: [email or web address] URL: [email or web address] Date: 02/04/2023 Prepared by: Precious Bard  Exercises - Seated March  - 1 x daily - 7 x weekly - 2 sets - 10 reps - 5 hold - Leg Extension  - 1 x daily - 7 x weekly - 2 sets - 10 reps - 3 hold - Seated Hip Abduction with Resistance  - 1 x daily - 7 x weekly - 2 sets - 10 reps - 5 hold  GOALS: Goals reviewed with patient? Yes  SHORT TERM GOALS: Target date: 03/13/2023  Pt will be independent with HEP in order to improve strength and balance in order to decrease fall risk and improve function at home and work.  Baseline: EVAL: No formal HEP in place Goal status: INITIAL    LONG TERM GOALS: Target date: 04/24/2023  1.  Patient (> 74 years old) will complete five times sit to stand test in < 15 seconds indicating an increased LE strength and improved balance. Baseline: EVAL: 22.68 without UE support Goal status: INITIAL  2.  Patient will increase FOTO score to equal to or greater than  55   to demonstrate statistically significant improvement in mobility and quality of life.  Baseline: EVAL=54 Goal status: INITIAL   3.  Patient will increase Berg Balance score by >  4 points to demonstrate decreased fall risk during  functional activities. Baseline: 45 Goal status: INITIAL   4.   Patient will reduce timed up and go to <11 seconds to reduce fall risk and demonstrate improved transfer/gait ability. Baseline: EVAL=13.99 sec with cane Goal status: INITIAL  5.   Patient will increase 10 meter walk test to >1.5m/s as to improve gait speed for better community ambulation and to reduce fall risk. Baseline: EVAL= 0.58 m/s Goal status: INITIAL  6.   Patient will increase six minute walk test distance to >1000 for progression to community ambulator and improve gait ability Baseline: EVAL: to be assessed 2nd visit 10/29: 715 ft  Goal status: INITIAL   ASSESSMENT:  CLINICAL IMPRESSION: Pt presents to therapy in good spirits and motivated. Pt reports one fall since last visit when bending down but is not in any pain today. Patient participated in treatment session focusing on LE strengthening and balance. Pt more challenged with balance activities involving narrow base of support, taking backwards steps, and performing pivot turns. Pt uses stepping strategies often crossing midline to regain balance. Pt requires UE support to perform weighted step ups indicating a need to progress LE strength.  He will benefit from skilled PT services to improve his functional strength/mobility and overall balance for improved quality of life and decreased risk of falling.   OBJECTIVE IMPAIRMENTS: Abnormal gait, decreased activity tolerance, decreased balance, decreased cognition, decreased coordination, decreased endurance, decreased mobility, difficulty walking, decreased strength, and impaired flexibility.   ACTIVITY LIMITATIONS: carrying, lifting, bending, sitting, standing, squatting, stairs, and transfers  PARTICIPATION LIMITATIONS: cleaning, laundry, shopping, community activity, and yard work  PERSONAL FACTORS: Age and 1-2 comorbidities: COPD, HTN  are also affecting patient's functional outcome.   REHAB POTENTIAL:  Good  CLINICAL DECISION MAKING: Evolving/moderate complexity  EVALUATION COMPLEXITY: Moderate  PLAN:  PT FREQUENCY: 1-2x/week  PT DURATION: 12 weeks  PLANNED INTERVENTIONS: 97164- PT Re-evaluation, 97110-Therapeutic exercises, 97530- Therapeutic activity, 97112- Neuromuscular re-education, 97535- Self Care, 13086- Manual therapy, [phone]- Gait training, [phone]- Orthotic Fit/training, [phone]- Canalith repositioning, [phone]- Electrical stimulation (manual), Patient/Family education, Balance training, Stair training, Taping, Dry Needling, Joint mobilization, Spinal mobilization, Vestibular training, Visual/preceptual remediation/compensation, DME instructions, Cryotherapy, and Moist heat  PLAN FOR NEXT SESSION:  balance and LE strengthening   Randon Goldsmith, Student-PT  This entire session was performed under direct supervision and direction of a licensed Estate agent . I have personally read, edited and approve of the note as written.  Precious Bard, PT 03/18/2023, 5:21 PM

## 2023-03-19 ENCOUNTER — Other Ambulatory Visit: Payer: Self-pay

## 2023-03-19 ENCOUNTER — Telehealth: Payer: Self-pay

## 2023-03-19 MED ORDER — GEMTESA 75 MG PO TABS: 1.0000 | ORAL_TABLET | Freq: Every day | ORAL | 4 refills | Status: DC

## 2023-03-19 NOTE — Telephone Encounter (Signed)
Pt 's wife stopped by to let us know that the samples of Leslye Peer was helping patient , and wanted to know if patient could have a sample or rx sent to the pharmacy . Advised patient that we can send in rx for medication, Gemtea x 30 days , with 4 refills to [facility] on [address] . Per pt request.

## 2023-03-20 ENCOUNTER — Ambulatory Visit: Payer: No Typology Code available for payment source

## 2023-03-20 DIAGNOSIS — R269 Unspecified abnormalities of gait and mobility: Secondary | ICD-10-CM

## 2023-03-20 DIAGNOSIS — R2681 Unsteadiness on feet: Secondary | ICD-10-CM

## 2023-03-20 DIAGNOSIS — R262 Difficulty in walking, not elsewhere classified: Secondary | ICD-10-CM

## 2023-03-20 DIAGNOSIS — I480 Paroxysmal atrial fibrillation: Secondary | ICD-10-CM | POA: Diagnosis not present

## 2023-03-20 DIAGNOSIS — R2689 Other abnormalities of gait and mobility: Secondary | ICD-10-CM

## 2023-03-20 DIAGNOSIS — M6281 Muscle weakness (generalized): Secondary | ICD-10-CM

## 2023-03-20 DIAGNOSIS — R278 Other lack of coordination: Secondary | ICD-10-CM

## 2023-03-20 NOTE — Therapy (Signed)
OUTPATIENT PHYSICAL THERAPY NEURO TREATMENT   Patient Name: Craig Gilmore MRN: [id number] DOB:[birth year], 88 y.o., male Today's Date: 03/20/2023   PCP: Dr. Jerl Mina   REFERRING PROVIDER: Dr. Jerl Mina  END OF SESSION:  PT End of Session - 03/20/23 1455     Visit Number 8    Number of Visits 24    Date for PT Re-Evaluation 04/24/23    Progress Note Due on Visit 10    PT Start Time 1446    PT Stop Time 1525    PT Time Calculation (min) 39 min    Equipment Utilized During Treatment Gait belt    Activity Tolerance Patient tolerated treatment well    Behavior During Therapy WFL for tasks assessed/performed                   Past Medical History:  Diagnosis Date   Chronic atrial fibrillation (HCC)    a.) CHA2DS2VASc = 3 (age x 2, HTN);  b.) s/p DCCV 08/20/2004 --> 50 J x 1 and 100 J x 1 --> coverted with SB. (+) post-procedure nausea; c.) rate/rhythm maintained without pharmacological intervention; chronically anticoagulated with warfarin   Colonic polyp    COPD (chronic obstructive pulmonary disease) (HCC)    Depression    Dyspnea    Edema    GERD (gastroesophageal reflux disease)    Gout    Hemorrhoids    Hyperlipidemia    Hypertension    Insomnia    a.) on hypnotic (zolpidem)   Long term current use of anticoagulant    a.) warfarin   OSA on CPAP    Pneumonia    Pulmonary HTN (HCC) 02/14/2016   a.) TTE 02/14/2016: RVSP 37.5 mmHg   Sick sinus syndrome (HCC)    Squamous cell carcinoma in situ 10/03/2009   L medial infrapectoral   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   L nose supratip   Stage 3b chronic kidney disease (CKD) (HCC)    Past Surgical History:  Procedure Laterality Date   CARDIOVERSION N/A 08/21/2004   Procedure: CIRECT CURRENT CARDIOVERSION; Location: [facility]; Surgeon: Harold Hedge, MD   CATARACT EXTRACTION, BILATERAL     CHOLECYSTECTOMY     COLONOSCOPY     IR EMBO TUMOR ORGAN ISCHEMIA INFARCT INC GUIDE ROADMAPPING   03/12/2023   IR RADIOLOGIST EVAL & MGMT  02/25/2023   NASAL SINUS SURGERY     Patient Active Problem List   Diagnosis Date Noted   Hyponatremia 12/19/2022   Sick sinus syndrome (HCC) 12/16/2022   Interstitial lung disease (HCC) 12/16/2022   Thoracic aortic aneurysm (HCC) 12/16/2022   Sepsis due to pneumonia (HCC) 12/16/2022   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 tophus 02/07/2022   Stage 3b chronic kidney disease (HCC) 02/07/2022   Hyperlipidemia 05/04/2015   Encounter for therapeutic drug monitoring 11/24/2013   Exertional dyspnea 11/23/2013   Chronic atrial fibrillation (HCC)    Pneumonia, organism unspecified(486) 08/17/2012    ONSET DATE: 2-3  REFERRING DIAG:  R26.81 (ICD-10-CM) - Unsteadiness on feet  R68.89 (ICD-10-CM) - Other general symptoms and signs  Z87.01 (ICD-10-CM) - Personal history of pneumonia (recurrent)    THERAPY DIAG:  Abnormality of gait and mobility  Unsteadiness on feet  Muscle weakness (generalized)  Other lack of coordination  Difficulty in walking, not elsewhere classified  Other abnormalities of gait and mobility  Rationale for Evaluation and Treatment: Rehabilitation  SUBJECTIVE:  SUBJECTIVE STATEMENT: Pt presents in good spirits and states no soreness after last session. Wife and son report one of their concerns is that he continues to hold his head down with all mobility.    Pt accompanied by:  wife- [name] and son  PERTINENT HISTORY: Patient is a 88 year old male with referall from PCP for imbalance and past medical history includes a recent hospitalization- 12/16/2022- 5 days for sepsis and PNA. PMH includes renal lesion, A-Fib, COPD, Depression, HTN, GOUT, Sleep apnea,  and HLD.  Wife reports he has been having balance issues for 2-3 years but  having more unsteadiness recently  PAIN:  Are you having pain? No  PRECAUTIONS: Fall  RED FLAGS: None   WEIGHT BEARING RESTRICTIONS: No  FALLS: Has patient fallen in last 6 months?  No falls but several near falls.   LIVING ENVIRONMENT: Lives with: lives with their family- wife (son stays during the day)  Lives in: House/apartment Stairs: Yes: External: 3 steps; on right going up Has following equipment at home: Single point cane, Rollator  PLOF: Requires assistive device for independence and Needs assistance with ADLs  PATIENT GOALS: To improve my balance and not fall  OBJECTIVE:  Note: Objective measures were completed at Evaluation unless otherwise noted.  DIAGNOSTIC FINDINGS:  CT SCAN- 12/16/2022 IMPRESSION: 1. No evidence for aortic dissection or aneurysm. 2. Ascending thoracic aortic aneurysm measuring 4.3 x 3.7 cm. Recommend annual imaging followup by CTA or MRA. This recommendation follows 2010 ACCF/AHA/AATS/ACR/ASA/SCA/SCAI/SIR/STS/SVM Guidelines for the Diagnosis and Management of Patients with Thoracic Aortic Disease. Circulation. 2010; 121: J191-Y782. Aortic aneurysm NOS (ICD10-I71.9) 3. Pathologic mediastinal and right hilar lymphadenopathy of uncertain etiology. 4. Findings compatible with interstitial lung disease. 5. Ground-glass opacity in the right lower lobe measuring 3.3 cm. This may be infectious/inflammatory, but neoplasm can not be excluded. 6. 4.8 cm indeterminate right renal lesion, possibly complex cysts. Recommend further evaluation with dedicated CT or MRI. 7. Bladder diverticulum. 8. Colonic diverticulosis. 9. Prostatomegaly. 10. Small fat containing left inguinal hernia.     Electronically Signed   By: Darliss Cheney M.D.   On: 12/16/2022 23:30  COGNITION: Overall cognitive status: Impaired- forgetful   SENSATION: WFL  COORDINATION: Delayed at times with BLE  EDEMA:  None observed   POSTURE: rounded shoulders and forward  head    LOWER EXTREMITY MMT:    MMT Right Eval Left Eval  Hip flexion 4 4  Hip extension    Hip abduction 4 4  Hip adduction 4 4  Hip internal rotation 4 4  Hip external rotation 4 4  Knee flexion 4 4  Knee extension 4 4  Ankle dorsiflexion 4 4  Ankle plantarflexion    Ankle inversion    Ankle eversion    (Blank rows = not tested)   TRANSFERS: Assistive device utilized: Single point cane  Sit to stand: Modified independence Stand to sit: Modified independence Chair to chair: Modified independence Floor:  Not tested   GAIT: Gait pattern: decreased arm swing- Right, decreased arm swing- Left, decreased step length- Right, decreased step length- Left, decreased stance time- Right, decreased stance time- Left, and decreased stride length Distance walked: > 200 feet in total Assistive device utilized: Single point cane Level of assistance: CGA Comments: decreased gait speed and VC to pick up feet  FUNCTIONAL TESTS:  5 times sit to stand: 22.68 sec withuout  Timed up and go (TUG): 13.99 sec with cane 6 minute walk test: To be performed next visit  10 meter walk test: 0.59 m/s Berg Balance Scale: 45/56  PATIENT SURVEYS:  FOTO 54  TODAY'S TREATMENT:                                                                                                                              DATE: 03/20/23  TherEx: -STS with Overhead shoulder raises- orange medicine ball 2x10 -Scap retraction with BTB 2 x 10 reps - Shoulder ext with BTB 2 x 10 reps - Wall posture stretch- Hold 60 sec x 2 (VC to keep looking ahead)  - Chin tuck on wall x 12 reps - Overhead shoulder raise (lifting green ball overhead) x 15 reps.   NMR:  -Forwards walking 35 ft x2 without AD (with wife standing at end of distance holding up fingers and having patient call out numbers to ensure he is looking up)  no LOB -Backwards walking 35 ft x2 with cane, decreased step length  (with wife standing at end of distance  holding up fingers and having patient call out numbers to ensure he is looking up)  -Step up onto 1st step with minimal UE support x 15 reps each LE -Step tap onto 1st step without UE support x 15 reps each LE   -   PATIENT EDUCATION: Education details: PT plan of care; goals Person educated: Patient Education method: Explanation Education comprehension: verbalized understanding  HOME EXERCISE PROGRAM:  Access Code: [email or web address] URL: [email or web address] Date: 03/20/2023 Prepared by: Maureen Ralphs  Exercises - Scapular Retraction with Resistance  - 1 x daily - 3 sets - 10 reps - Shoulder extension with resistance - Neutral  - 1 x daily - 7 x weekly - 3 sets - 10 reps   Access Code: [email or web address] URL: [email or web address] Date: 02/04/2023 Prepared by: Precious Bard  Exercises - Seated March  - 1 x daily - 7 x weekly - 2 sets - 10 reps - 5 hold - Leg Extension  - 1 x daily - 7 x weekly - 2 sets - 10 reps - 3 hold - Seated Hip Abduction with Resistance  - 1 x daily - 7 x weekly - 2 sets - 10 reps - 5 hold  GOALS: Goals reviewed with patient? Yes  SHORT TERM GOALS: Target date: 03/13/2023  Pt will be independent with HEP in order to improve strength and balance in order to decrease fall risk and improve function at home and work.  Baseline: EVAL: No formal HEP in place Goal status: INITIAL    LONG TERM GOALS: Target date: 04/24/2023  1.  Patient (> 66 years old) will complete five times sit to stand test in < 15 seconds indicating an increased LE strength and improved balance. Baseline: EVAL: 22.68 without UE support Goal status: INITIAL  2.  Patient will increase FOTO score to equal to or greater than  55   to demonstrate statistically significant improvement in mobility and quality of life.  Baseline: EVAL=54 Goal status: INITIAL  3.  Patient will increase Berg Balance score by > 4 points to demonstrate decreased fall risk during functional  activities. Baseline: 45 Goal status: INITIAL   4.   Patient will reduce timed up and go to <11 seconds to reduce fall risk and demonstrate improved transfer/gait ability. Baseline: EVAL=13.99 sec with cane Goal status: INITIAL  5.   Patient will increase 10 meter walk test to >1.26m/s as to improve gait speed for better community ambulation and to reduce fall risk. Baseline: EVAL= 0.58 m/s Goal status: INITIAL  6.   Patient will increase six minute walk test distance to >1000 for progression to community ambulator and improve gait ability Baseline: EVAL: to be assessed 2nd visit 10/29: 715 ft  Goal status: INITIAL   ASSESSMENT:  CLINICAL IMPRESSION: Patient arrived with good motivation - Having some pressure feeling with urinary catheter but otherwise okay. He performed well with balance overall yet continued cues for erect posture and patient with bad habit of looking down which will be a focus in future visits. He was able to hold his head up with Repetitive verbal cues today. Added some postural exercises and issued handout to continue to work on his posture at home.  He will benefit from skilled PT services to improve his functional strength/mobility and overall balance for improved quality of life and decreased risk of falling.   OBJECTIVE IMPAIRMENTS: Abnormal gait, decreased activity tolerance, decreased balance, decreased cognition, decreased coordination, decreased endurance, decreased mobility, difficulty walking, decreased strength, and impaired flexibility.   ACTIVITY LIMITATIONS: carrying, lifting, bending, sitting, standing, squatting, stairs, and transfers  PARTICIPATION LIMITATIONS: cleaning, laundry, shopping, community activity, and yard work  PERSONAL FACTORS: Age and 1-2 comorbidities: COPD, HTN  are also affecting patient's functional outcome.   REHAB POTENTIAL: Good  CLINICAL DECISION MAKING: Evolving/moderate complexity  EVALUATION COMPLEXITY:  Moderate  PLAN:  PT FREQUENCY: 1-2x/week  PT DURATION: 12 weeks  PLANNED INTERVENTIONS: 97164- PT Re-evaluation, 97110-Therapeutic exercises, 97530- Therapeutic activity, 97112- Neuromuscular re-education, 97535- Self Care, 16109- Manual therapy, [phone]- Gait training, [phone]- Orthotic Fit/training, [phone]- Canalith repositioning, [phone]- Electrical stimulation (manual), Patient/Family education, Balance training, Stair training, Taping, Dry Needling, Joint mobilization, Spinal mobilization, Vestibular training, Visual/preceptual remediation/compensation, DME instructions, Cryotherapy, and Moist heat  PLAN FOR NEXT SESSION:  balance and LE strengthening    Lenda Kelp, PT 03/20/2023, 3:51 PM

## 2023-03-25 ENCOUNTER — Ambulatory Visit: Payer: No Typology Code available for payment source

## 2023-03-26 ENCOUNTER — Ambulatory Visit: Payer: No Typology Code available for payment source

## 2023-03-27 ENCOUNTER — Ambulatory Visit
Admission: RE | Admit: 2023-03-27 | Discharge: 2023-03-27 | Disposition: A | Discharge status: Home / Self Care | Payer: No Typology Code available for payment source | Source: Ambulatory Visit | Attending: Student | Admitting: Student

## 2023-03-27 ENCOUNTER — Ambulatory Visit: Payer: No Typology Code available for payment source | Admitting: Physical Therapy

## 2023-03-27 DIAGNOSIS — N401 Enlarged prostate with lower urinary tract symptoms: Secondary | ICD-10-CM | POA: Diagnosis not present

## 2023-03-27 DIAGNOSIS — R2681 Unsteadiness on feet: Secondary | ICD-10-CM | POA: Diagnosis not present

## 2023-03-27 DIAGNOSIS — M6281 Muscle weakness (generalized): Secondary | ICD-10-CM

## 2023-03-27 DIAGNOSIS — R262 Difficulty in walking, not elsewhere classified: Secondary | ICD-10-CM

## 2023-03-27 DIAGNOSIS — R338 Other retention of urine: Secondary | ICD-10-CM | POA: Diagnosis not present

## 2023-03-27 DIAGNOSIS — R2689 Other abnormalities of gait and mobility: Secondary | ICD-10-CM

## 2023-03-27 DIAGNOSIS — R269 Unspecified abnormalities of gait and mobility: Secondary | ICD-10-CM

## 2023-03-27 HISTORY — PX: IR RADIOLOGIST EVAL & MGMT: IMG5224

## 2023-03-27 MED ORDER — SOLIFENACIN SUCCINATE 5 MG PO TABS: 5.0000 mg | ORAL_TABLET | Freq: Every day | ORAL | 0 refills | Status: DC

## 2023-03-27 NOTE — Therapy (Signed)
OUTPATIENT PHYSICAL THERAPY NEURO TREATMENT   Patient Name: Craig Gilmore MRN: [id number] DOB:1934/05/20, 88 y.o., male Today's Date: 03/27/2023   PCP: Dr. Jerl Mina   REFERRING PROVIDER: Dr. Jerl Mina  END OF SESSION:  PT End of Session - 03/27/23 1024     Visit Number 9    Number of Visits 24    Date for PT Re-Evaluation 04/24/23    Progress Note Due on Visit 10    PT Start Time 1018    PT Stop Time 1058    PT Time Calculation (min) 40 min    Equipment Utilized During Treatment Gait belt    Activity Tolerance Patient tolerated treatment well    Behavior During Therapy WFL for tasks assessed/performed                    Past Medical History:  Diagnosis Date   Chronic atrial fibrillation (HCC)    a.) CHA2DS2VASc = 3 (age x 2, HTN);  b.) s/p DCCV 08/20/2004 --> 50 J x 1 and 100 J x 1 --> coverted with SB. (+) post-procedure nausea; c.) rate/rhythm maintained without pharmacological intervention; chronically anticoagulated with warfarin   Colonic polyp    COPD (chronic obstructive pulmonary disease) (HCC)    Depression    Dyspnea    Edema    GERD (gastroesophageal reflux disease)    Gout    Hemorrhoids    Hyperlipidemia    Hypertension    Insomnia    a.) on hypnotic (zolpidem)   Long term current use of anticoagulant    a.) warfarin   OSA on CPAP    Pneumonia    Pulmonary HTN (HCC) 02/14/2016   a.) TTE 02/14/2016: RVSP 37.5 mmHg   Sick sinus syndrome (HCC)    Squamous cell carcinoma in situ 10/03/2009   L medial infrapectoral   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   L nose supratip   Stage 3b chronic kidney disease (CKD) (HCC)    Past Surgical History:  Procedure Laterality Date   CARDIOVERSION N/A 08/21/2004   Procedure: CIRECT CURRENT CARDIOVERSION; Location: [facility]; Surgeon: Harold Hedge, MD   CATARACT EXTRACTION, BILATERAL     CHOLECYSTECTOMY     COLONOSCOPY     IR EMBO TUMOR ORGAN ISCHEMIA INFARCT INC GUIDE ROADMAPPING   03/12/2023   IR RADIOLOGIST EVAL & MGMT  02/25/2023   NASAL SINUS SURGERY     Patient Active Problem List   Diagnosis Date Noted   Hyponatremia 12/19/2022   Sick sinus syndrome (HCC) 12/16/2022   Interstitial lung disease (HCC) 12/16/2022   Thoracic aortic aneurysm (HCC) 12/16/2022   Sepsis due to pneumonia (HCC) 12/16/2022   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 tophus 02/07/2022   Stage 3b chronic kidney disease (HCC) 02/07/2022   Hyperlipidemia 05/04/2015   Encounter for therapeutic drug monitoring 11/24/2013   Exertional dyspnea 11/23/2013   Chronic atrial fibrillation (HCC)    Pneumonia, organism unspecified(486) 08/17/2012    ONSET DATE: 2-3  REFERRING DIAG:  R26.81 (ICD-10-CM) - Unsteadiness on feet  R68.89 (ICD-10-CM) - Other general symptoms and signs  Z87.01 (ICD-10-CM) - Personal history of pneumonia (recurrent)    THERAPY DIAG:  Abnormality of gait and mobility  Unsteadiness on feet  Muscle weakness (generalized)  Difficulty in walking, not elsewhere classified  Other abnormalities of gait and mobility  Rationale for Evaluation and Treatment: Rehabilitation  SUBJECTIVE:  SUBJECTIVE STATEMENT:  Pt reports doing well today. Pt denies any recent falls/stumbles since prior session. Pt denies any updates to medications or medical appointment since prior session. Son and wife present with patient this date.     Pt accompanied by:  wife- [name] and son  PERTINENT HISTORY: Patient is a 88 year old male with referall from PCP for imbalance and past medical history includes a recent hospitalization- 12/16/2022- 5 days for sepsis and PNA. PMH includes renal lesion, A-Fib, COPD, Depression, HTN, GOUT, Sleep apnea,  and HLD.  Wife reports he has been having balance issues for 2-3  years but having more unsteadiness recently  PAIN:  Are you having pain? No  PRECAUTIONS: Fall  RED FLAGS: None   WEIGHT BEARING RESTRICTIONS: No  FALLS: Has patient fallen in last 6 months?  No falls but several near falls.   LIVING ENVIRONMENT: Lives with: lives with their family- wife (son stays during the day)  Lives in: House/apartment Stairs: Yes: External: 3 steps; on right going up Has following equipment at home: Single point cane, Rollator  PLOF: Requires assistive device for independence and Needs assistance with ADLs  PATIENT GOALS: To improve my balance and not fall  OBJECTIVE:  Note: Objective measures were completed at Evaluation unless otherwise noted.  DIAGNOSTIC FINDINGS:  CT SCAN- 12/16/2022 IMPRESSION: 1. No evidence for aortic dissection or aneurysm. 2. Ascending thoracic aortic aneurysm measuring 4.3 x 3.7 cm. Recommend annual imaging followup by CTA or MRA. This recommendation follows 2010 ACCF/AHA/AATS/ACR/ASA/SCA/SCAI/SIR/STS/SVM Guidelines for the Diagnosis and Management of Patients with Thoracic Aortic Disease. Circulation. 2010; 121: R604-V409. Aortic aneurysm NOS (ICD10-I71.9) 3. Pathologic mediastinal and right hilar lymphadenopathy of uncertain etiology. 4. Findings compatible with interstitial lung disease. 5. Ground-glass opacity in the right lower lobe measuring 3.3 cm. This may be infectious/inflammatory, but neoplasm can not be excluded. 6. 4.8 cm indeterminate right renal lesion, possibly complex cysts. Recommend further evaluation with dedicated CT or MRI. 7. Bladder diverticulum. 8. Colonic diverticulosis. 9. Prostatomegaly. 10. Small fat containing left inguinal hernia.     Electronically Signed   By: Darliss Cheney M.D.   On: 12/16/2022 23:30  COGNITION: Overall cognitive status: Impaired- forgetful   SENSATION: WFL  COORDINATION: Delayed at times with BLE  EDEMA:  None observed   POSTURE: rounded shoulders and  forward head    LOWER EXTREMITY MMT:    MMT Right Eval Left Eval  Hip flexion 4 4  Hip extension    Hip abduction 4 4  Hip adduction 4 4  Hip internal rotation 4 4  Hip external rotation 4 4  Knee flexion 4 4  Knee extension 4 4  Ankle dorsiflexion 4 4  Ankle plantarflexion    Ankle inversion    Ankle eversion    (Blank rows = not tested)   TRANSFERS: Assistive device utilized: Single point cane  Sit to stand: Modified independence Stand to sit: Modified independence Chair to chair: Modified independence Floor:  Not tested   GAIT: Gait pattern: decreased arm swing- Right, decreased arm swing- Left, decreased step length- Right, decreased step length- Left, decreased stance time- Right, decreased stance time- Left, and decreased stride length Distance walked: > 200 feet in total Assistive device utilized: Single point cane Level of assistance: CGA Comments: decreased gait speed and VC to pick up feet  FUNCTIONAL TESTS:  5 times sit to stand: 22.68 sec withuout  Timed up and go (TUG): 13.99 sec with cane 6 minute walk test: To be  performed next visit 10 meter walk test: 0.59 m/s Berg Balance Scale: 45/56  PATIENT SURVEYS:  FOTO 54  TODAY'S TREATMENT:                                                                                                                              DATE: 03/27/23  TherEx: -STS with orange medicine ball 2x10 -Scap retraction with BTB 2 x 10 reps - Shoulder ext with BTB  x 10 reps   NMR:  -Forwards walking 35 ft x4 without AD  -forwards then Backwards walking 35 ft x2 without cane -Step tap onto 1st step without UE support 2*10 reps each LE - airex SLS progression 2 x 30 sec ea LE  - Airex step on / off x 10 ea LE  PATIENT EDUCATION: Education details: PT plan of care; goals Person educated: Patient Education method: Explanation Education comprehension: verbalized understanding  HOME EXERCISE PROGRAM:  Access Code:  [email or web address] URL: [email or web address] Date: 03/20/2023 Prepared by: Maureen Ralphs  Exercises - Scapular Retraction with Resistance  - 1 x daily - 3 sets - 10 reps - Shoulder extension with resistance - Neutral  - 1 x daily - 7 x weekly - 3 sets - 10 reps   Access Code: [email or web address] URL: [email or web address] Date: 02/04/2023 Prepared by: Precious Bard  Exercises - Seated March  - 1 x daily - 7 x weekly - 2 sets - 10 reps - 5 hold - Leg Extension  - 1 x daily - 7 x weekly - 2 sets - 10 reps - 3 hold - Seated Hip Abduction with Resistance  - 1 x daily - 7 x weekly - 2 sets - 10 reps - 5 hold  GOALS: Goals reviewed with patient? Yes  SHORT TERM GOALS: Target date: 03/13/2023  Pt will be independent with HEP in order to improve strength and balance in order to decrease fall risk and improve function at home and work.  Baseline: EVAL: No formal HEP in place Goal status: INITIAL    LONG TERM GOALS: Target date: 04/24/2023  1.  Patient (> 50 years old) will complete five times sit to stand test in < 15 seconds indicating an increased LE strength and improved balance. Baseline: EVAL: 22.68 without UE support Goal status: INITIAL  2.  Patient will increase FOTO score to equal to or greater than  55   to demonstrate statistically significant improvement in mobility and quality of life.  Baseline: EVAL=54 Goal status: INITIAL   3.  Patient will increase Berg Balance score by > 4 points to demonstrate decreased fall risk during functional activities. Baseline: 45 Goal status: INITIAL   4.   Patient will reduce timed up and go to <11 seconds to reduce fall risk and demonstrate improved transfer/gait ability. Baseline: EVAL=13.99 sec with cane Goal status: INITIAL  5.   Patient will increase 10 meter walk test to >1.100m/s as to improve gait speed for better community ambulation  and to reduce fall risk. Baseline: EVAL= 0.58 m/s Goal status: INITIAL  6.    Patient will increase six minute walk test distance to >1000 for progression to community ambulator and improve gait ability Baseline: EVAL: to be assessed 2nd visit 10/29: 715 ft  Goal status: INITIAL   ASSESSMENT:  CLINICAL IMPRESSION:  Patient presents with good motivation for completion of physical therapy activities.  Patient quiet during session but overall completes all exercises as instructed.  Patient completes all exercises without assistive device showing improved tolerance for functional ambulation including retroambulation.  Patient still having difficulty with keeping head up while walking could will be a continued focus during therapeutic interventions in future sessions. Pt will continue to benefit from skilled physical therapy intervention to address impairments, improve QOL, and attain therapy goals.    OBJECTIVE IMPAIRMENTS: Abnormal gait, decreased activity tolerance, decreased balance, decreased cognition, decreased coordination, decreased endurance, decreased mobility, difficulty walking, decreased strength, and impaired flexibility.   ACTIVITY LIMITATIONS: carrying, lifting, bending, sitting, standing, squatting, stairs, and transfers  PARTICIPATION LIMITATIONS: cleaning, laundry, shopping, community activity, and yard work  PERSONAL FACTORS: Age and 1-2 comorbidities: COPD, HTN  are also affecting patient's functional outcome.   REHAB POTENTIAL: Good  CLINICAL DECISION MAKING: Evolving/moderate complexity  EVALUATION COMPLEXITY: Moderate  PLAN:  PT FREQUENCY: 1-2x/week  PT DURATION: 12 weeks  PLANNED INTERVENTIONS: 97164- PT Re-evaluation, 97110-Therapeutic exercises, 97530- Therapeutic activity, 97112- Neuromuscular re-education, 97535- Self Care, 16109- Manual therapy, [phone]- Gait training, [phone]- Orthotic Fit/training, [phone]- Canalith repositioning, [phone]- Electrical stimulation (manual), Patient/Family education, Balance training, Stair training, Taping,  Dry Needling, Joint mobilization, Spinal mobilization, Vestibular training, Visual/preceptual remediation/compensation, DME instructions, Cryotherapy, and Moist heat  PLAN FOR NEXT SESSION:  balance and LE strengthening    Norman Herrlich, PT 03/27/2023, 2:23 PM

## 2023-03-27 NOTE — Progress Notes (Signed)
Chief Complaint: Patient was consulted remotely today (TeleHealth) for Foley catheter dependent urinary retention in the setting of BPH at the request of Craig Gilmore.    Referring Physician(s): Craig Gilmore  History of Present Illness: Craig Gilmore is a 88 y.o. male with a history of recent hospitalization for urinary retention this past September.  Prior to this hospitalization, he was having lower urinary tract symptoms including urinary frequency, postvoid dribbling and having to get up multiple times throughout the night to void.  He was initiated on tamsulosin and a Foley catheter was placed.  At the time of discharge, the Foley catheter was removed and he initially did well with some improvement in symptoms.  However, bladder scan on follow-up demonstrates significant postvoid residual (586 mL) necessitating replacement of the Foley catheter.   Due to his age and medical comorbidities, he is not an optimal candidate for surgical treatments of BPH.  Therefore, he Underwent prostate artery embolization on 03/12/2023.  The procedure was successful and without complication.  He was discharged home the same day in good condition.  I spoke over the phone today with both he and his wife for his 2-week follow-up evaluation.  He has recovered well.  He has no issues at the arterial access site.  He continues to have complaints of a strong urge to urinate between the hours of approximately 2 PM and 7 PM every day.  This has been ongoing for some time but seems to be worse lately.  Otherwise, he is doing well. Past Medical History:  Diagnosis Date   Chronic atrial fibrillation (HCC)    a.) CHA2DS2VASc = 3 (age x 2, HTN);  b.) s/p DCCV 08/20/2004 --> 50 J x 1 and 100 J x 1 --> coverted with SB. (+) post-procedure nausea; c.) rate/rhythm maintained without pharmacological intervention; chronically anticoagulated with warfarin   Colonic polyp    COPD (chronic obstructive pulmonary  disease) (HCC)    Depression    Dyspnea    Edema    GERD (gastroesophageal reflux disease)    Gout    Hemorrhoids    Hyperlipidemia    Hypertension    Insomnia    a.) on hypnotic (zolpidem)   Long term current use of anticoagulant    a.) warfarin   OSA on CPAP    Pneumonia    Pulmonary HTN (HCC) 02/14/2016   a.) TTE 02/14/2016: RVSP 37.5 mmHg   Sick sinus syndrome (HCC)    Squamous cell carcinoma in situ 10/03/2009   L medial infrapectoral   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 Authorizing Provider  solifenacin (VESICARE) 5 MG tablet Take 1 tablet (5 mg total) by mouth daily for 21 days. 03/27/23 04/17/23 Yes Pattricia Boss D, PA-C  albuterol (VENTOLIN HFA) 108 (90 Base) MCG/ACT inhaler Inhale 2 puffs into the lungs 2 (two) times  daily. 01/27/23 01/27/24  [provider]  amLODipine (NORVASC) 5 MG tablet Take 5 mg by mouth daily.    [provider]  azithromycin (ZITHROMAX) 250 MG tablet Take 250 mg by mouth daily.    [provider]  buPROPion (WELLBUTRIN XL) 150 MG 24 hr tablet Take 75 mg by mouth every morning.    [provider]  Choline Fenofibrate (FENOFIBRIC ACID) 135 MG CPDR Take 1 capsule by mouth  daily Patient taking differently: Take 1 tablet by mouth every evening. Take 1 capsule by mouth  daily 05/18/15   Iran Ouch, MD  dutasteride  (AVODART) 0.5 MG capsule Take 1 capsule (0.5 mg total) by mouth daily. 01/31/23   Vaillancourt, Lelon Mast, PA-C  febuxostat (ULORIC) 40 MG tablet Take 40 mg by mouth every evening.    [provider]  fluticasone (FLONASE) 50 MCG/ACT nasal spray Place 1 spray into both nostrils at bedtime.    [provider]  furosemide (LASIX) 20 MG tablet Take 1 tablet (20 mg total) by mouth daily as needed for fluid or edema (Take 1 tablet for worsening leg swelling, shortness of breath, or weight gain of greater than 3 lbs.). 12/21/22   Marcelino Duster, MD  ipratropium (ATROVENT) 0.03 % nasal spray Place 2 sprays into both nostrils every morning.    [provider]  levocetirizine (XYZAL) 5 MG tablet Take 5 mg by mouth every evening.    [provider]  omeprazole (PRILOSEC OTC) 20 MG tablet Take 20 mg by mouth daily before breakfast.    [provider]  prednisoLONE 5 MG TABS tablet Take 5 mg by mouth daily.    [provider]  predniSONE (DELTASONE) 5 MG tablet Take 5 mg by mouth daily. 01/27/23 04/27/23  [provider]  sertraline (ZOLOFT) 100 MG tablet Take 150 mg by mouth every morning.    [provider]  tamsulosin (FLOMAX) 0.4 MG CAPS capsule Take 1 capsule (0.4 mg total) by mouth daily after supper. 12/20/22   Marcelino Duster, MD  Vibegron (GEMTESA) 75 MG TABS Take 1 tablet (75 mg total) by mouth daily. 03/19/23 04/18/23  Riki Altes, MD  warfarin (COUMADIN) 2.5 MG tablet Take 1 tablet (2.5 mg total) by mouth every evening. Take as directed by anticoagulation clinic 12/23/22   Marcelino Duster, MD  zolpidem (AMBIEN) 10 MG tablet Take 10 mg by mouth at bedtime.    [provider]     Family History  Problem Relation Age of Onset   Diabetes Mother    Heart disease Mother    Heart disease Father    Aneurysm Father     Social History   Socioeconomic History   Marital status: Married    Spouse name:  Not on file   Number of children: Not on file   Years of education: Not on file   Highest education level: Not on file  Occupational History   Not on file  Tobacco Use   Smoking status: Former    Current packs/day: 0.00    Average packs/day: 1.5 packs/day for 50.0 years (75.0 ttl pk-yrs)    Types: Cigarettes    Start date: 04/09/1939    Quit date: 04/08/1989    Years since quitting: 33.9   Smokeless tobacco: Never  Vaping Use   Vaping status: Never Used  Substance and Sexual Activity   Alcohol use: No   Drug use: No   Sexual activity: Not on file  Other Topics Concern  Not on file  Social History Narrative   Not on file   Social Drivers of Health   Financial Resource Strain: Low Risk  (05/27/2022)   Received from [facility], [facility]   Overall Financial Resource Strain (CARDIA)    Difficulty of Paying Living Expenses: Not very hard  Food Insecurity: No Food Insecurity (01/22/2023)   Hunger Vital Sign    Worried About Running Out of Food in the Last Year: Never true    Ran Out of Food in the Last Year: Never true  Transportation Needs: No Transportation Needs (01/22/2023)   PRAPARE - Administrator, Civil Service (Medical): No    Lack of Transportation (Non-Medical): No  Physical Activity: Not on file  Stress: Not on file  Social Connections: Not on file    Review of Systems  Review of Systems: A 12 point ROS discussed and pertinent positives are indicated in the HPI above.  All other systems are negative.  Advance Care Plan: The advanced care plan/surrogate decision maker was discussed at the time of visit and the patient did not wish to discuss or was not able to name a surrogate decision maker or provide an advance care plan.    Physical Exam No direct physical exam was performed (except for noted visual exam findings with Video Visits).    Vital Signs: There were no vitals taken for this visit.  Imaging: IR  EMBO TUMOR ORGAN ISCHEMIA INFARCT INC GUIDE ROADMAPPING Result Date: 03/12/2023 INDICATION: 88 year old male with benign prostatic hyperplasia, urinary retention and Foley catheter dependence. He presents for prostatic artery embolization in an effort to achieve catheter independence. EXAM: 1. Ultrasound-guided access of the right common femoral artery 2. Left internal iliac artery catheterization and arteriogram 3. Left prostatic artery catheterization and arteriogram 4. Catheterization and arteriogram of the median branch of the left prostatic artery including cone beam CT and independent 3D workstation reconstruction 5. Particle and coil embolization of the left prostatic artery. 6. Catheterization of the right internal iliac artery with arteriogram 7. Catheterization of the superior vesicular artery with arteriogram 8. Catheterization of the seminal vesicle artery with arteriogram including cone beam CT an independent 3D workstation reconstruction 9. Catheterization of the internal dental artery with arteriogram 10. Catheterization of the right inferior epigastric artery with arteriogram 11. Catheterization of the aberrant right obturator artery with arteriogram 12. Catheterization of the right prostatic artery with arteriogram 13. Catheterization of the medial branch of the right prostatic artery with arteriogram including cone beam CT and independent 3D workstation reconstruction 14. Particle and coil embolization of the right prostatic artery. 15. Arterial closure with Celt. MEDICATIONS: Bactrim DS p.o. The antibiotic was administered within 1 hour of the procedure. Approximately 600 mcg nitroglycerin was administered intra-arterially during the course of the procedure. ANESTHESIA/SEDATION: Moderate (conscious) sedation was employed during this procedure. A total of Versed 2.5 mg and Fentanyl 125 mcg and 25 mg Benadryl was administered intravenously by the radiology nurse. Total intra-service moderate  Sedation Time: 148 minutes. The patient's level of consciousness and vital signs were monitored continuously by radiology nursing throughout the procedure under my direct supervision. CONTRAST:  110 mL Omnipaque 300 FLUOROSCOPY: Radiation Exposure Index (as provided by the fluoroscopic device): 3,193 mGy Kerma COMPLICATIONS: None immediate. PROCEDURE: Informed consent was obtained from the patient following explanation of the procedure, risks, benefits and alternatives. The patient understands, agrees and consents for the procedure. All questions were addressed. A time out was performed prior  to the initiation of the procedure. Maximal barrier sterile technique utilized including caps, mask, sterile gowns, sterile gloves, large sterile drape, hand hygiene, and Betadine prep. The right common femoral artery was interrogated with ultrasound and found to be widely patent. An image was obtained and stored for the medical record. Local anesthesia was attained by infiltration with 1% lidocaine. A small dermatotomy was made. Under real-time sonographic guidance, the vessel was punctured with a 21 gauge micropuncture needle. Using standard technique, the initial micro needle was exchanged over a 0.018 micro wire for a transitional 4 [place] micro sheath. The micro sheath was then exchanged over a 0.035 wire for a 5 French vascular sheath. A C2 cobra catheter was advanced up in over the aortic bifurcation and into the left internal iliac artery. The left internal iliac arteriogram was performed. As expected from the prior CT imaging, there is no obturator artery as it is aberrant and replaced to the inferior epigastric artery. A well-defined prostatic artery is visualized arising from the proximal internal pudendal artery. A Progreat alpha microcatheter was advanced over a Fathom 14 wire into the origin of the prostatic artery. Arteriography was performed confirming catheter placement and parenchymal blush within the  prostatic parenchyma. The catheter was then advanced distally into the medial branch of the prostatic artery. Additional angiography was performed with concurrent cone beam CT imaging. 3D reformatted images were evaluated and it was confirmed that there is excellent opacification of the median lobe of the prostate gland. No evidence of nontarget opacification. Therefore, embolization was performed utilizing 400 micron hydro pearl beads. Once stasis was achieved, the prostatic artery was coil embolized using two 2x50 mm low-profile Ruby detachable coils. Follow-up arteriography demonstrates complete occlusion of the prostatic artery. The microcatheter was removed. The Cobra catheter was brought back into the collateral right common iliac artery and a change for a rim catheter which was then used to select the right internal iliac artery. A right internal iliac arteriogram was performed. As expected, no obturator artery is visualized. The operator artery is aberrant and replaced to the inferior epigastric artery. There are several potential prostatic arteries arising proximally from a common origin shared with the internal pudendal artery. The Progreat outflow microcatheter was reintroduced. The first branch artery was selected and arteriography was performed. This confirms the branch opacifies the inferior bladder wall consistent with the superior vesicular artery. Progreat microcatheter was used to select another branch artery. The microcatheter was advanced distally and arteriography was performed. Contrast enhancement appears to be in the region of the prostate gland but does not have the typical parenchymal blush. Therefore, additional arteriography with cone beam CT and 3D workstation reconstruction was performed. This confirms enhancement of the right seminal vesicle confirming this artery to represent the seminal vesicular artery. There was no contrast enhancement within the prostate gland. Therefore, the  microcatheter was advanced out distally into the pudendal artery and arteriography was performed. Normal distal pudendal artery anatomy. No evidence of aberrant prostatic artery. The catheters were removed. An arteriogram was performed through the right common femoral sheath confirming the aberrant obturator artery arising from the inferior epigastric artery. The rim catheter was reintroduced and used to select the origin of the inferior epigastric artery. The microcatheter was advanced into the origin of the inferior epigastric artery. Arteriography was performed demonstrating the aberrant operator artery. The microcatheter was further advanced into the obturator artery. Additional arteriography was performed in several obliquities. There is a branch arising proximally from the obturator artery which  tracks medially and appears to provide flow to the prostatic parenchyma. The microcatheter was successfully advanced into the prostatic artery. Contrast injection confirms parenchymal blush. The microcatheter was further advanced into the medial division of the prostatic artery and additional arteriography with cone beam CT was performed. This confirms enhancement of the median lobe of the right prostate gland. Embolization was then performed using 400 micron hydro pearls followed by coil embolization with two 1 x 5 mm low-profile penumbra detachable coils. Catheters were removed. Hemostasis was attained with the assistance of a Celt arterial closure device. IMPRESSION: 1. Bilateral aberrant obturator arteries replaced to the inferior epigastric arteries. 2. Successful bilateral prostatic artery embolization. Of note, the right prostatic artery arises from the aberrant obturator artery. Electronically Signed   By: Malachy Moan M.D.   On: 03/12/2023 15:10    Labs:  CBC: Recent Labs    12/18/22 0341 12/19/22 0630 12/20/22 0528 03/12/23 0800  WBC 9.1 14.1* 11.3* 8.7  HGB 13.0 13.8 13.2 14.1  HCT 39.9  41.0 40.2 44.5  PLT 231 260 250 201    COAGS: Recent Labs    12/18/22 0341 12/19/22 0630 12/20/22 0528 03/12/23 0800  INR 3.4* 3.4* 3.6* 1.4*    BMP: Recent Labs    12/18/22 0341 12/19/22 0630 12/20/22 0528 03/12/23 0800  NA 134* 133* 133* 138  K 3.5 3.5 3.7 4.4  CL 105 99 101 103  CO2 19* 19* 20* 28  GLUCOSE 98 105* 99 103*  BUN 33* 35* 37* 41*  CALCIUM 8.5* 9.1 8.7* 9.3  CREATININE 1.61* 1.79* 1.82* 2.16*  GFRNONAA 41* 36* 36* 29*    LIVER FUNCTION TESTS: Recent Labs    12/16/22 1907 12/17/22 0506  BILITOT 1.0 0.9  AST 36 29  ALT 23 18  ALKPHOS 69 53  PROT 7.8 6.4*  ALBUMIN 4.0 3.3*    TUMOR MARKERS: No results for input(s): "AFPTM", "CEA", "CA199", "CHROMGRNA" in the last 8760 hours.  Assessment and Plan:  88 year old male with benign prostatic hyperplasia resulting in urinary retention and Foley catheter dependence.  He is not an operative candidate and therefore underwent prostatic artery embolization 2 weeks previously.  He has recovered well and is having no significant issues.  He continues to have a significant urge to urinate in the mid-to-late afternoon every day.  Due to his relatively poor renal function he cannot receive Pyridium.  He is already on Gemtesa for overactive bladder.  We discussed adding Vesicare, but that was before I realized Dr. Lonna Cobb had just prescribed him Gemtesa.  At this point, we will have to manage as well as we can for the next few weeks until he is ready to attempt a catheter trial.  Hopefully we will be able to get his Foley catheter out.  1.) F/U call in 2-3 months     Electronically Signed: Sterling Big 03/27/2023, 2:48 PM   I spent a total of 15 Minutes in remote  clinical consultation, greater than 50% of which was counseling/coordinating care for foley catheter dependent urinary retention.    Visit type: Audio only (telephone). Audio (no video) only due to patient's lack of internet/smartphone  capability. Alternative for in-person consultation at [facility], [address] [place], [place], [place]. This visit type was conducted due to national recommendations for restrictions regarding the COVID-19 Pandemic (e.g. social distancing).  This format is felt to be most appropriate for this patient at this time.  All issues noted in this document were discussed and  addressed.

## 2023-03-31 ENCOUNTER — Ambulatory Visit: Payer: No Typology Code available for payment source | Admitting: Physical Therapy

## 2023-03-31 ENCOUNTER — Encounter: Payer: Self-pay | Admitting: Physical Therapy

## 2023-03-31 DIAGNOSIS — R2681 Unsteadiness on feet: Secondary | ICD-10-CM | POA: Diagnosis not present

## 2023-03-31 DIAGNOSIS — R2689 Other abnormalities of gait and mobility: Secondary | ICD-10-CM

## 2023-03-31 DIAGNOSIS — M6281 Muscle weakness (generalized): Secondary | ICD-10-CM

## 2023-03-31 DIAGNOSIS — R269 Unspecified abnormalities of gait and mobility: Secondary | ICD-10-CM

## 2023-03-31 DIAGNOSIS — R262 Difficulty in walking, not elsewhere classified: Secondary | ICD-10-CM

## 2023-03-31 NOTE — Therapy (Signed)
OUTPATIENT PHYSICAL THERAPY NEURO TREATMENT/ Physical Therapy Progress Note   Dates of reporting period  01/30/23   to   03/31/23   Patient Name: Craig Gilmore MRN: [id number] DOB:1934/06/10, 88 y.o., male Today's Date: 03/31/2023   PCP: Dr. Jerl Mina   REFERRING PROVIDER: Dr. Jerl Mina  END OF SESSION:  PT End of Session - 03/31/23 1121     Visit Number 10    Number of Visits 24    Date for PT Re-Evaluation 04/24/23    Progress Note Due on Visit 10    PT Start Time 0930    PT Stop Time 1012    PT Time Calculation (min) 42 min    Equipment Utilized During Treatment Gait belt    Activity Tolerance Patient tolerated treatment well    Behavior During Therapy WFL for tasks assessed/performed                     Past Medical History:  Diagnosis Date   Chronic atrial fibrillation (HCC)    a.) CHA2DS2VASc = 3 (age x 2, HTN);  b.) s/p DCCV 08/20/2004 --> 50 J x 1 and 100 J x 1 --> coverted with SB. (+) post-procedure nausea; c.) rate/rhythm maintained without pharmacological intervention; chronically anticoagulated with warfarin   Colonic polyp    COPD (chronic obstructive pulmonary disease) (HCC)    Depression    Dyspnea    Edema    GERD (gastroesophageal reflux disease)    Gout    Hemorrhoids    Hyperlipidemia    Hypertension    Insomnia    a.) on hypnotic (zolpidem)   Long term current use of anticoagulant    a.) warfarin   OSA on CPAP    Pneumonia    Pulmonary HTN (HCC) 02/14/2016   a.) TTE 02/14/2016: RVSP 37.5 mmHg   Sick sinus syndrome (HCC)    Squamous cell carcinoma in situ 10/03/2009   L medial infrapectoral   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   L nose supratip   Stage 3b chronic kidney disease (CKD) (HCC)    Past Surgical History:  Procedure Laterality Date   CARDIOVERSION N/A 08/21/2004   Procedure: CIRECT CURRENT CARDIOVERSION; Location: [facility]; Surgeon: Harold Hedge, MD   CATARACT EXTRACTION, BILATERAL      CHOLECYSTECTOMY     COLONOSCOPY     IR EMBO TUMOR ORGAN ISCHEMIA INFARCT INC GUIDE ROADMAPPING  03/12/2023   IR RADIOLOGIST EVAL & MGMT  02/25/2023   IR RADIOLOGIST EVAL & MGMT  03/27/2023   NASAL SINUS SURGERY     Patient Active Problem List   Diagnosis Date Noted   Hyponatremia 12/19/2022   Sick sinus syndrome (HCC) 12/16/2022   Interstitial lung disease (HCC) 12/16/2022   Thoracic aortic aneurysm (HCC) 12/16/2022   Sepsis due to pneumonia (HCC) 12/16/2022   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 tophus 02/07/2022   Stage 3b chronic kidney disease (HCC) 02/07/2022   Hyperlipidemia 05/04/2015   Encounter for therapeutic drug monitoring 11/24/2013   Exertional dyspnea 11/23/2013   Chronic atrial fibrillation (HCC)    Pneumonia, organism unspecified(486) 08/17/2012    ONSET DATE: 2-3  REFERRING DIAG:  R26.81 (ICD-10-CM) - Unsteadiness on feet  R68.89 (ICD-10-CM) - Other general symptoms and signs  Z87.01 (ICD-10-CM) - Personal history of pneumonia (recurrent)    THERAPY DIAG:  Abnormality of gait and mobility  Unsteadiness on feet  Muscle weakness (generalized)  Difficulty in walking, not elsewhere classified  Other abnormalities of gait  and mobility  Rationale for Evaluation and Treatment: Rehabilitation  SUBJECTIVE:                                                                                                                                                                                             SUBJECTIVE STATEMENT:  Pt reports doing well today. Pt denies any recent falls/stumbles since prior session. Pt denies any updates to medications or medical appointment since prior session. Son and wife present with patient this date.     Pt accompanied by:  wife- [name] and son  PERTINENT HISTORY: Patient is a 88 year old male with referall from PCP for imbalance and past medical history includes a recent hospitalization- 12/16/2022- 5 days for sepsis and PNA. PMH  includes renal lesion, A-Fib, COPD, Depression, HTN, GOUT, Sleep apnea,  and HLD.  Wife reports he has been having balance issues for 2-3 years but having more unsteadiness recently  PAIN:  Are you having pain? No  PRECAUTIONS: Fall  RED FLAGS: None   WEIGHT BEARING RESTRICTIONS: No  FALLS: Has patient fallen in last 6 months?  No falls but several near falls.   LIVING ENVIRONMENT: Lives with: lives with their family- wife (son stays during the day)  Lives in: House/apartment Stairs: Yes: External: 3 steps; on right going up Has following equipment at home: Single point cane, Rollator  PLOF: Requires assistive device for independence and Needs assistance with ADLs  PATIENT GOALS: To improve my balance and not fall  OBJECTIVE:  Note: Objective measures were completed at Evaluation unless otherwise noted.  DIAGNOSTIC FINDINGS:  CT SCAN- 12/16/2022 IMPRESSION: 1. No evidence for aortic dissection or aneurysm. 2. Ascending thoracic aortic aneurysm measuring 4.3 x 3.7 cm. Recommend annual imaging followup by CTA or MRA. This recommendation follows 2010 ACCF/AHA/AATS/ACR/ASA/SCA/SCAI/SIR/STS/SVM Guidelines for the Diagnosis and Management of Patients with Thoracic Aortic Disease. Circulation. 2010; 121: Z610-R604. Aortic aneurysm NOS (ICD10-I71.9) 3. Pathologic mediastinal and right hilar lymphadenopathy of uncertain etiology. 4. Findings compatible with interstitial lung disease. 5. Ground-glass opacity in the right lower lobe measuring 3.3 cm. This may be infectious/inflammatory, but neoplasm can not be excluded. 6. 4.8 cm indeterminate right renal lesion, possibly complex cysts. Recommend further evaluation with dedicated CT or MRI. 7. Bladder diverticulum. 8. Colonic diverticulosis. 9. Prostatomegaly. 10. Small fat containing left inguinal hernia.     Electronically Signed   By: Darliss Cheney M.D.   On: 12/16/2022 23:30  COGNITION: Overall cognitive status:  Impaired- forgetful   SENSATION: WFL  COORDINATION: Delayed at times with BLE  EDEMA:  None observed   POSTURE: rounded shoulders and forward head  LOWER EXTREMITY MMT:    MMT Right Eval Left Eval  Hip flexion 4 4  Hip extension    Hip abduction 4 4  Hip adduction 4 4  Hip internal rotation 4 4  Hip external rotation 4 4  Knee flexion 4 4  Knee extension 4 4  Ankle dorsiflexion 4 4  Ankle plantarflexion    Ankle inversion    Ankle eversion    (Blank rows = not tested)   TRANSFERS: Assistive device utilized: Single point cane  Sit to stand: Modified independence Stand to sit: Modified independence Chair to chair: Modified independence Floor:  Not tested   GAIT: Gait pattern: decreased arm swing- Right, decreased arm swing- Left, decreased step length- Right, decreased step length- Left, decreased stance time- Right, decreased stance time- Left, and decreased stride length Distance walked: > 200 feet in total Assistive device utilized: Single point cane Level of assistance: CGA Comments: decreased gait speed and VC to pick up feet  FUNCTIONAL TESTS:  5 times sit to stand: 22.68 sec withuout  Timed up and go (TUG): 13.99 sec with cane 6 minute walk test: To be performed next visit 10 meter walk test: 0.59 m/s Berg Balance Scale: 45/56  PATIENT SURVEYS:  FOTO 54  TODAY'S TREATMENT:                                                                                                                              DATE: 03/31/23  TherEx: -STS with orange medicine ball ( 5KG) 2 x 10 -Scap retraction with BTB 2 x 10 reps -Shoulder ext with BTB  x 10 reps -Ambulation 2 x 300 ft with SPC, intermittent use, not proper pattern.   NMR:  -Forwards walking 40 ft x 2 without AD alternating with -forwards then Backwards walking 40 ft x 2 without cane -Step tap onto 1st step without UE support 2*10 reps each LE - airex SLS progression ( contralateral foot on step 2 x  30 sec ea LE  - Airex step on / off x 10 ea LE lateral, UE support throughout, difficulty with sequencing, fading cues   PATIENT EDUCATION: Education details: PT plan of care; goals Person educated: Patient Education method: Explanation Education comprehension: verbalized understanding  HOME EXERCISE PROGRAM:  Access Code: [email or web address] URL: [email or web address] Date: 03/20/2023 Prepared by: Maureen Ralphs  Exercises - Scapular Retraction with Resistance  - 1 x daily - 3 sets - 10 reps - Shoulder extension with resistance - Neutral  - 1 x daily - 7 x weekly - 3 sets - 10 reps   Access Code: [email or web address] URL: [email or web address] Date: 02/04/2023 Prepared by: Precious Bard  Exercises - Seated March  - 1 x daily - 7 x weekly - 2 sets - 10 reps - 5 hold - Leg Extension  - 1 x daily - 7 x weekly - 2 sets - 10 reps - 3 hold - Seated Hip Abduction with Resistance  - 1  x daily - 7 x weekly - 2 sets - 10 reps - 5 hold  GOALS: Goals reviewed with patient? Yes  SHORT TERM GOALS: Target date: 03/13/2023  Pt will be independent with HEP in order to improve strength and balance in order to decrease fall risk and improve function at home and work.  Baseline: EVAL: No formal HEP in place Goal status: INITIAL    LONG TERM GOALS: Target date: 04/24/2023  1.  Patient (> 59 years old) will complete five times sit to stand test in < 15 seconds indicating an increased LE strength and improved balance. Baseline: EVAL: 22.68 without UE support Goal status: INITIAL  2.  Patient will increase FOTO score to equal to or greater than  55   to demonstrate statistically significant improvement in mobility and quality of life.  Baseline: EVAL=54 Goal status: INITIAL   3.  Patient will increase Berg Balance score by > 4 points to demonstrate decreased fall risk during functional activities. Baseline: 45 Goal status: INITIAL   4.   Patient will reduce timed up and go to  <11 seconds to reduce fall risk and demonstrate improved transfer/gait ability. Baseline: EVAL=13.99 sec with cane Goal status: INITIAL  5.   Patient will increase 10 meter walk test to >1.18m/s as to improve gait speed for better community ambulation and to reduce fall risk. Baseline: EVAL= 0.58 m/s Goal status: INITIAL  6.   Patient will increase six minute walk test distance to >1000 for progression to community ambulator and improve gait ability Baseline: EVAL: to be assessed 2nd visit 10/29: 715 ft  Goal status: INITIAL   ASSESSMENT:  CLINICAL IMPRESSION:  Patient presents good motivation for completion of physical therapy activities.  Goals to be assessed next session.  Patient comes to therapy today with some points of discomfort for like he needs to use the bathroom but his caregivers are sure patient and therapist patient has a catheter and bag on so he does not really need to urinate.  Physical therapist spends initial part of session ensuring patient is able to get comfortable with the exercises and activities.  Patient does show good progress with his ability to ambulate showing ambulation of further distances but does require increased cues this date for proper completion and proper direction following.Pt will continue to benefit from skilled physical therapy intervention to address impairments, improve QOL, and attain therapy goals. Patient's condition has the potential to improve in response to therapy. Maximum improvement is yet to be obtained. The anticipated improvement is attainable and reasonable in a generally predictable time.      OBJECTIVE IMPAIRMENTS: Abnormal gait, decreased activity tolerance, decreased balance, decreased cognition, decreased coordination, decreased endurance, decreased mobility, difficulty walking, decreased strength, and impaired flexibility.   ACTIVITY LIMITATIONS: carrying, lifting, bending, sitting, standing, squatting, stairs, and  transfers  PARTICIPATION LIMITATIONS: cleaning, laundry, shopping, community activity, and yard work  PERSONAL FACTORS: Age and 1-2 comorbidities: COPD, HTN  are also affecting patient's functional outcome.   REHAB POTENTIAL: Good  CLINICAL DECISION MAKING: Evolving/moderate complexity  EVALUATION COMPLEXITY: Moderate  PLAN:  PT FREQUENCY: 1-2x/week  PT DURATION: 12 weeks  PLANNED INTERVENTIONS: 97164- PT Re-evaluation, 97110-Therapeutic exercises, 97530- Therapeutic activity, [id number]- Neuromuscular re-education, 97535- Self Care, 16109- Manual therapy, [id number]- Gait training, [phone]- Orthotic Fit/training, [phone]- Canalith repositioning, [phone]- Electrical stimulation (manual), Patient/Family education, Balance training, Stair training, Taping, Dry Needling, Joint mobilization, Spinal mobilization, Vestibular training, Visual/preceptual remediation/compensation, DME instructions, Cryotherapy, and Moist heat  PLAN FOR NEXT SESSION:  balance and LE strengthening  Assess goals   Norman Herrlich, PT 03/31/2023, 11:22 AM

## 2023-04-03 ENCOUNTER — Ambulatory Visit: Payer: No Typology Code available for payment source

## 2023-04-03 DIAGNOSIS — I480 Paroxysmal atrial fibrillation: Secondary | ICD-10-CM | POA: Diagnosis not present

## 2023-04-04 ENCOUNTER — Ambulatory Visit: Payer: No Typology Code available for payment source | Admitting: Physical Therapy

## 2023-04-07 ENCOUNTER — Ambulatory Visit: Payer: No Typology Code available for payment source | Admitting: Physical Therapy

## 2023-04-08 ENCOUNTER — Ambulatory Visit: Payer: No Typology Code available for payment source

## 2023-04-10 ENCOUNTER — Ambulatory Visit
Admission: RE | Admit: 2023-04-10 | Discharge: 2023-04-10 | Disposition: A | Discharge status: Home / Self Care | Payer: No Typology Code available for payment source | Source: Ambulatory Visit | Attending: Otolaryngology | Admitting: Otolaryngology

## 2023-04-10 ENCOUNTER — Other Ambulatory Visit: Payer: Self-pay | Admitting: Otolaryngology

## 2023-04-10 ENCOUNTER — Ambulatory Visit: Payer: No Typology Code available for payment source

## 2023-04-10 DIAGNOSIS — R059 Cough, unspecified: Secondary | ICD-10-CM

## 2023-04-10 DIAGNOSIS — I10 Essential (primary) hypertension: Secondary | ICD-10-CM | POA: Diagnosis not present

## 2023-04-10 DIAGNOSIS — R0602 Shortness of breath: Secondary | ICD-10-CM | POA: Diagnosis not present

## 2023-04-10 DIAGNOSIS — R918 Other nonspecific abnormal finding of lung field: Secondary | ICD-10-CM | POA: Diagnosis not present

## 2023-04-10 DIAGNOSIS — G4733 Obstructive sleep apnea (adult) (pediatric): Secondary | ICD-10-CM | POA: Diagnosis not present

## 2023-04-10 DIAGNOSIS — R053 Chronic cough: Secondary | ICD-10-CM | POA: Diagnosis not present

## 2023-04-10 DIAGNOSIS — H6123 Impacted cerumen, bilateral: Secondary | ICD-10-CM | POA: Diagnosis not present

## 2023-04-10 DIAGNOSIS — R052 Subacute cough: Secondary | ICD-10-CM | POA: Diagnosis not present

## 2023-04-10 DIAGNOSIS — D487 Neoplasm of uncertain behavior of other specified sites: Secondary | ICD-10-CM | POA: Diagnosis not present

## 2023-04-10 DIAGNOSIS — C44229 Squamous cell carcinoma of skin of left ear and external auricular canal: Secondary | ICD-10-CM | POA: Diagnosis not present

## 2023-04-11 ENCOUNTER — Ambulatory Visit: Payer: No Typology Code available for payment source

## 2023-04-14 ENCOUNTER — Ambulatory Visit: Payer: No Typology Code available for payment source | Admitting: Physical Therapy

## 2023-04-14 ENCOUNTER — Other Ambulatory Visit: Payer: Self-pay | Admitting: Pulmonary Disease

## 2023-04-14 DIAGNOSIS — J849 Interstitial pulmonary disease, unspecified: Secondary | ICD-10-CM

## 2023-04-15 ENCOUNTER — Ambulatory Visit: Payer: No Typology Code available for payment source

## 2023-04-16 ENCOUNTER — Ambulatory Visit: Payer: No Typology Code available for payment source | Admitting: Physical Therapy

## 2023-04-17 ENCOUNTER — Ambulatory Visit: Payer: No Typology Code available for payment source

## 2023-04-17 ENCOUNTER — Ambulatory Visit (INDEPENDENT_AMBULATORY_CARE_PROVIDER_SITE_OTHER): Payer: No Typology Code available for payment source | Admitting: Physician Assistant

## 2023-04-17 ENCOUNTER — Encounter: Payer: Self-pay | Admitting: Physician Assistant

## 2023-04-17 ENCOUNTER — Ambulatory Visit: Payer: No Typology Code available for payment source | Admitting: Physician Assistant

## 2023-04-17 VITALS — BP 141/84 | HR 88 | Ht 65.0 in | Wt 199.0 lb

## 2023-04-17 DIAGNOSIS — R339 Retention of urine, unspecified: Secondary | ICD-10-CM

## 2023-04-17 DIAGNOSIS — I480 Paroxysmal atrial fibrillation: Secondary | ICD-10-CM | POA: Diagnosis not present

## 2023-04-17 LAB — BLADDER SCAN AMB NON-IMAGING: Scan Result: 419

## 2023-04-17 NOTE — Progress Notes (Signed)
 Patient ID: HRIDHAAN YOHN, male   DOB: [birth year], 89 y.o.   MRN: [id number] Catheter Removal  Patient is present today for a catheter removal.  8ml of water was drained from the balloon. A 16FR foley cath was removed from the bladder, no complications were noted. Patient tolerated well.  Performed by: Judge Sharps, CMA  Follow up/ Additional notes: this afternoon at 3pm

## 2023-04-17 NOTE — Progress Notes (Signed)
 04/17/2023 2:12 PM   Craig Gilmore 1934-05-17 [id number]  CC: Chief Complaint  Patient presents with   Follow-up    Voiding trial   HPI: Craig Gilmore is a 89 y.o. male with PMH memory impairment and BPH with urinary retention s/p PAE with IR on 03/12/2023 who presents today for voiding trial. He is accompanied today by his wife, who contributes to HPI.  Foley catheter removed in the morning, see separate procedure note for details.  He returned to clinic in the afternoon for PVR.  He reports drinking approximately 24oz of fluid. He has voided multiple times and denies bladder discomfort or urgency. His wife has not noticed his typical urinary frequency.  PVR , previously 600+mL.  PMH: Past Medical History:  Diagnosis Date   Chronic atrial fibrillation (HCC)    a.) CHA2DS2VASc = 3 (age x 2, HTN);  b.) s/p DCCV 08/20/2004 --> 50 J x 1 and 100 J x 1 --> coverted with SB. (+) post-procedure nausea; c.) rate/rhythm maintained without pharmacological intervention; chronically anticoagulated with warfarin   Colonic polyp    COPD (chronic obstructive pulmonary disease) (HCC)    Depression    Dyspnea    Edema    GERD (gastroesophageal reflux disease)    Gout    Hemorrhoids    Hyperlipidemia    Hypertension    Insomnia    a.) on hypnotic (zolpidem )   Long term current use of anticoagulant    a.) warfarin   OSA on CPAP    Pneumonia    Pulmonary HTN (HCC) 02/14/2016   a.) TTE 02/14/2016: RVSP 37.5 mmHg   Sick sinus syndrome (HCC)    Squamous cell carcinoma in situ 10/03/2009   L medial infrapectoral   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   L nose supratip   Stage 3b chronic kidney disease (CKD) (HCC)     Surgical History: Past Surgical History:  Procedure Laterality Date   CARDIOVERSION N/A 08/21/2004   Procedure: CIRECT CURRENT CARDIOVERSION; Location: [facility]; Surgeon: Vinie Jude, MD   CATARACT EXTRACTION, BILATERAL     CHOLECYSTECTOMY     COLONOSCOPY     IR  EMBO TUMOR ORGAN ISCHEMIA INFARCT INC GUIDE ROADMAPPING  03/12/2023   IR RADIOLOGIST EVAL & MGMT  02/25/2023   IR RADIOLOGIST EVAL & MGMT  03/27/2023   NASAL SINUS SURGERY      Home Medications:  Allergies as of 04/17/2023       Reactions   Baycol [cerivastatin]    Pravastatin         Medication List        Accurate as of April 17, 2023  2:12 PM. If you have any questions, ask your nurse or doctor.          albuterol 108 (90 Base) MCG/ACT inhaler Commonly known as: VENTOLIN HFA Inhale 2 puffs into the lungs 2 (two) times daily.   amLODipine 5 MG tablet Commonly known as: NORVASC Take 5 mg by mouth daily.   azithromycin  250 MG tablet Commonly known as: ZITHROMAX  Take 250 mg by mouth daily.   buPROPion  150 MG 24 hr tablet Commonly known as: WELLBUTRIN  XL Take 75 mg by mouth every morning.   dutasteride  0.5 MG capsule Commonly known as: AVODART  Take 1 capsule (0.5 mg total) by mouth daily.   febuxostat  40 MG tablet Commonly known as: ULORIC  Take 40 mg by mouth every evening.   Fenofibric Acid  135 MG Cpdr Take 1 capsule by mouth  daily  What changed:  how much to take how to take this when to take this   fluticasone  50 MCG/ACT nasal spray Commonly known as: FLONASE  Place 1 spray into both nostrils at bedtime.   furosemide  20 MG tablet Commonly known as: LASIX  Take 1 tablet (20 mg total) by mouth daily as needed for fluid or edema (Take 1 tablet for worsening leg swelling, shortness of breath, or weight gain of greater than 3 lbs.).   Gemtesa  75 MG Tabs Generic drug: Vibegron  Take 1 tablet (75 mg total) by mouth daily.   ipratropium 0.03 % nasal spray Commonly known as: ATROVENT  Place 2 sprays into both nostrils every morning.   levocetirizine 5 MG tablet Commonly known as: XYZAL  Take 5 mg by mouth every evening.   omeprazole  20 MG tablet Commonly known as: PRILOSEC  OTC Take 20 mg by mouth daily before breakfast.   prednisoLONE 5 MG Tabs  tablet Take 5 mg by mouth daily.   predniSONE 5 MG tablet Commonly known as: DELTASONE Take 5 mg by mouth daily.   sertraline  100 MG tablet Commonly known as: ZOLOFT  Take 150 mg by mouth every morning.   solifenacin  5 MG tablet Commonly known as: VESIcare  Take 1 tablet (5 mg total) by mouth daily for 21 days.   solifenacin  5 MG tablet Commonly known as: VESIcare  Take 1 tablet (5 mg total) by mouth daily for 21 days.   tamsulosin  0.4 MG Caps capsule Commonly known as: Flomax  Take 1 capsule (0.4 mg total) by mouth daily after supper.   warfarin 2.5 MG tablet Commonly known as: COUMADIN  Take 1 tablet (2.5 mg total) by mouth every evening. Take as directed by anticoagulation clinic   zolpidem  10 MG tablet Commonly known as: AMBIEN  Take 10 mg by mouth at bedtime.        Allergies:  Allergies  Allergen Reactions   Baycol [Cerivastatin]    Pravastatin     Family History: Family History  Problem Relation Age of Onset   Diabetes Mother    Heart disease Mother    Heart disease Father    Aneurysm Father     Social History:   reports that he quit smoking about 34 years ago. His smoking use included cigarettes. He started smoking about 84 years ago. He has a 75 pack-year smoking history. He has been exposed to tobacco smoke. He has never used smokeless tobacco. He reports that he does not drink alcohol and does not use drugs.  Physical Exam: BP (!) 141/84   Pulse 88   Ht 5' 5 (1.651 m)   Wt 199 lb (90.3 kg)   BMI 33.12 kg/m   Constitutional:  Alert and oriented, no acute distress, nontoxic appearing HEENT: [place], AT Cardiovascular: No clubbing, cyanosis, or edema Respiratory: Normal respiratory effort, no increased work of breathing Skin: No rashes, bruises or suspicious lesions Neurologic: Grossly intact, no focal deficits, moving all 4 extremities Psychiatric: Normal mood and affect  Laboratory Data: Results for orders placed or performed in visit on 04/17/23   BLADDER SCAN AMB NON-IMAGING   Collection Time: 04/17/23  3:22 PM  Result Value Ref Range   Scan Result 419 ml    Assessment & Plan:   1. Urinary retention (Primary) Voiding trial equivocal after PAE. I strongly suspect he'll have an element of chronic incomplete emptying and we'll tolerate this barring rUTI, hydronephrosis, or declining renal function. Will plan for repeat PVR tomorrow and we discussed timed voiding every 2 hours during the day. They  are in agreement with this plan. - BLADDER SCAN AMB NON-IMAGING  Return in about 1 day (around 04/18/2023) for Repeat PVR.  Lucie Hones, PA-C  [facility] [place] [address], [address] [place], [place] [address] [phone]

## 2023-04-18 ENCOUNTER — Ambulatory Visit
Admission: RE | Admit: 2023-04-18 | Discharge: 2023-04-18 | Disposition: A | Discharge status: Home / Self Care | Payer: No Typology Code available for payment source | Source: Ambulatory Visit | Attending: Pulmonary Disease | Admitting: Pulmonary Disease

## 2023-04-18 ENCOUNTER — Ambulatory Visit (INDEPENDENT_AMBULATORY_CARE_PROVIDER_SITE_OTHER): Payer: No Typology Code available for payment source | Admitting: Physician Assistant

## 2023-04-18 DIAGNOSIS — R339 Retention of urine, unspecified: Secondary | ICD-10-CM | POA: Diagnosis not present

## 2023-04-18 DIAGNOSIS — J849 Interstitial pulmonary disease, unspecified: Secondary | ICD-10-CM | POA: Insufficient documentation

## 2023-04-18 DIAGNOSIS — I7121 Aneurysm of the ascending aorta, without rupture: Secondary | ICD-10-CM | POA: Diagnosis not present

## 2023-04-18 DIAGNOSIS — R590 Localized enlarged lymph nodes: Secondary | ICD-10-CM | POA: Diagnosis not present

## 2023-04-18 DIAGNOSIS — R911 Solitary pulmonary nodule: Secondary | ICD-10-CM | POA: Diagnosis not present

## 2023-04-18 LAB — BLADDER SCAN AMB NON-IMAGING: PVR: 392 WU

## 2023-04-18 NOTE — Progress Notes (Signed)
 04/18/2023 9:42 AM   Emil HERO Oshiro 07-01-1934 [id number]  CC: Chief Complaint  Patient presents with   Urinary Retention   HPI: Craig Gilmore is a 89 y.o. male with PMH memory impairment and BPH with urinary retention s/p PAE with IR on 03/12/2023 who presents today for repeat PVR after voiding trial yesterday.   Today he reports he has continued to void without difficulty overnight.  His wife has not noticed any bothersome frequency.  PVR 392 mL, previously 419 mL.  PMH: Past Medical History:  Diagnosis Date   Chronic atrial fibrillation (HCC)    a.) CHA2DS2VASc = 3 (age x 2, HTN);  b.) s/p DCCV 08/20/2004 --> 50 J x 1 and 100 J x 1 --> coverted with SB. (+) post-procedure nausea; c.) rate/rhythm maintained without pharmacological intervention; chronically anticoagulated with warfarin   Colonic polyp    COPD (chronic obstructive pulmonary disease) (HCC)    Depression    Dyspnea    Edema    GERD (gastroesophageal reflux disease)    Gout    Hemorrhoids    Hyperlipidemia    Hypertension    Insomnia    a.) on hypnotic (zolpidem )   Long term current use of anticoagulant    a.) warfarin   OSA on CPAP    Pneumonia    Pulmonary HTN (HCC) 02/14/2016   a.) TTE 02/14/2016: RVSP 37.5 mmHg   Sick sinus syndrome (HCC)    Squamous cell carcinoma in situ 10/03/2009   L medial infrapectoral   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   L nose supratip   Stage 3b chronic kidney disease (CKD) (HCC)     Surgical History: Past Surgical History:  Procedure Laterality Date   CARDIOVERSION N/A 08/21/2004   Procedure: CIRECT CURRENT CARDIOVERSION; Location: [facility]; Surgeon: Vinie Jude, MD   CATARACT EXTRACTION, BILATERAL     CHOLECYSTECTOMY     COLONOSCOPY     IR EMBO TUMOR ORGAN ISCHEMIA INFARCT INC GUIDE ROADMAPPING  03/12/2023   IR RADIOLOGIST EVAL & MGMT  02/25/2023   IR RADIOLOGIST EVAL & MGMT  03/27/2023   NASAL SINUS SURGERY      Home Medications:  Allergies as of  04/18/2023       Reactions   Baycol [cerivastatin]    Pravastatin         Medication List        Accurate as of April 18, 2023  9:42 AM. If you have any questions, ask your nurse or doctor.          STOP taking these medications    prednisoLONE 5 MG Tabs tablet Stopped by: Lucie Hones       TAKE these medications    albuterol 108 (90 Base) MCG/ACT inhaler Commonly known as: VENTOLIN HFA Inhale 2 puffs into the lungs 2 (two) times daily.   amLODipine 5 MG tablet Commonly known as: NORVASC Take 5 mg by mouth daily.   azithromycin  250 MG tablet Commonly known as: ZITHROMAX  Take 250 mg by mouth daily.   buPROPion  150 MG 24 hr tablet Commonly known as: WELLBUTRIN  XL Take 75 mg by mouth every morning.   dutasteride  0.5 MG capsule Commonly known as: AVODART  Take 1 capsule (0.5 mg total) by mouth daily.   febuxostat  40 MG tablet Commonly known as: ULORIC  Take 40 mg by mouth every evening.   Fenofibric Acid  135 MG Cpdr Take 1 capsule by mouth  daily What changed:  how much to take how to take  this when to take this   fluticasone  50 MCG/ACT nasal spray Commonly known as: FLONASE  Place 1 spray into both nostrils at bedtime.   furosemide  20 MG tablet Commonly known as: LASIX  Take 1 tablet (20 mg total) by mouth daily as needed for fluid or edema (Take 1 tablet for worsening leg swelling, shortness of breath, or weight gain of greater than 3 lbs.).   ipratropium 0.03 % nasal spray Commonly known as: ATROVENT  Place 2 sprays into both nostrils every morning.   levocetirizine 5 MG tablet Commonly known as: XYZAL  Take 5 mg by mouth every evening.   omeprazole  20 MG tablet Commonly known as: PRILOSEC  OTC Take 20 mg by mouth daily before breakfast.   predniSONE 5 MG tablet Commonly known as: DELTASONE Take 5 mg by mouth daily.   sertraline  100 MG tablet Commonly known as: ZOLOFT  Take 150 mg by mouth every morning.   tamsulosin  0.4 MG  Caps capsule Commonly known as: Flomax  Take 1 capsule (0.4 mg total) by mouth daily after supper.   warfarin 2.5 MG tablet Commonly known as: COUMADIN  Take 1 tablet (2.5 mg total) by mouth every evening. Take as directed by anticoagulation clinic   zolpidem  10 MG tablet Commonly known as: AMBIEN  Take 10 mg by mouth at bedtime.        Allergies:  Allergies  Allergen Reactions   Baycol [Cerivastatin]    Pravastatin     Family History: Family History  Problem Relation Age of Onset   Diabetes Mother    Heart disease Mother    Heart disease Father    Aneurysm Father     Social History:   reports that he quit smoking about 34 years ago. His smoking use included cigarettes. He started smoking about 84 years ago. He has a 75 pack-year smoking history. He has been exposed to tobacco smoke. He has never used smokeless tobacco. He reports that he does not drink alcohol and does not use drugs.  Physical Exam: There were no vitals taken for this visit.  Constitutional:  Alert and oriented, no acute distress, nontoxic appearing HEENT: [place], AT Cardiovascular: No clubbing, cyanosis, or edema Respiratory: Normal respiratory effort, no increased work of breathing Skin: No rashes, bruises or suspicious lesions Neurologic: Grossly intact, no focal deficits, moving all 4 extremities Psychiatric: Normal mood and affect  Laboratory Data: Results for orders placed or performed in visit on 04/18/23  Bladder Scan (Post Void Residual) in office   Collection Time: 04/18/23  9:30 AM  Result Value Ref Range   PVR 392.0 WU   Assessment & Plan:   1. Urinary retention (Primary) Stable bladder scan this morning, we are in agreement that we are going to tolerate an element of chronic incomplete bladder emptying and him.  I will see him back in 3 months with a renal ultrasound prior to ensure no hydronephrosis.  They are in agreement with this plan.  Continue dutasteride  and tamsulosin , stop  Vesicare  and Gemtesa . - Bladder Scan (Post Void Residual) in office - US  RENAL; Future  Return in about 3 months (around 07/17/2023) for Symptom recheck with PVR and RUS prior.  Lucie Hones, PA-C  [facility] [place] [address], [address] [place], [place] [address]

## 2023-04-18 NOTE — Patient Instructions (Signed)
 Please CONTINUE the following medications: -Avodart (dutasteride) -Flomax (tamsulosin)  Please STOP the following medications:  -Vesicare (solifenacin) -Gemtesa (vibegron)

## 2023-04-21 ENCOUNTER — Ambulatory Visit: Payer: No Typology Code available for payment source | Admitting: Physical Therapy

## 2023-04-21 ENCOUNTER — Telehealth: Payer: Self-pay | Admitting: Physician Assistant

## 2023-04-21 DIAGNOSIS — N401 Enlarged prostate with lower urinary tract symptoms: Secondary | ICD-10-CM

## 2023-04-21 DIAGNOSIS — C44529 Squamous cell carcinoma of skin of other part of trunk: Secondary | ICD-10-CM | POA: Diagnosis not present

## 2023-04-21 DIAGNOSIS — C44329 Squamous cell carcinoma of skin of other parts of face: Secondary | ICD-10-CM | POA: Diagnosis not present

## 2023-04-21 NOTE — Telephone Encounter (Signed)
 Pt's wife stopped in to request a RX for Flomax to be sent to [facility] on [address]. Please advice patient.

## 2023-04-22 ENCOUNTER — Ambulatory Visit: Payer: No Typology Code available for payment source

## 2023-04-22 MED ORDER — TAMSULOSIN HCL 0.4 MG PO CAPS: 0.4000 mg | ORAL_CAPSULE | Freq: Every day | ORAL | 11 refills | Status: DC

## 2023-04-22 NOTE — Telephone Encounter (Signed)
 Refill sent.

## 2023-04-23 ENCOUNTER — Ambulatory Visit: Payer: No Typology Code available for payment source | Admitting: Physical Therapy

## 2023-04-23 NOTE — Telephone Encounter (Signed)
LM informing pt refill was sent in

## 2023-04-24 ENCOUNTER — Ambulatory Visit: Payer: No Typology Code available for payment source | Admitting: Physical Therapy

## 2023-04-24 ENCOUNTER — Other Ambulatory Visit: Payer: Self-pay

## 2023-04-24 DIAGNOSIS — I482 Chronic atrial fibrillation, unspecified: Secondary | ICD-10-CM | POA: Diagnosis not present

## 2023-04-24 DIAGNOSIS — N1832 Chronic kidney disease, stage 3b: Secondary | ICD-10-CM | POA: Diagnosis not present

## 2023-04-24 DIAGNOSIS — I10 Essential (primary) hypertension: Secondary | ICD-10-CM | POA: Diagnosis not present

## 2023-04-24 MED ORDER — ABRYSVO 120 MCG/0.5ML IM SOLR
0.5000 mL | Freq: Once | INTRAMUSCULAR | 0 refills | Status: AC
  Filled 2023-04-24: qty 0.5, 1d supply, fill #0

## 2023-04-24 NOTE — Therapy (Deleted)
OUTPATIENT PHYSICAL THERAPY NEURO TREATMENT   Patient Name: Craig Gilmore MRN: [id number] DOB:04-24-1934, 89 y.o., male Today's Date: 04/24/2023   PCP: Dr. Jerl Mina   REFERRING PROVIDER: Dr. Jerl Mina  END OF SESSION:            Past Medical History:  Diagnosis Date   Chronic atrial fibrillation [facility])    a.) CHA2DS2VASc = 3 (age x 2, HTN);  b.) s/p DCCV 08/20/2004 --> 50 J x 1 and 100 J x 1 --> coverted with SB. (+) post-procedure nausea; c.) rate/rhythm maintained without pharmacological intervention; chronically anticoagulated with warfarin   Colonic polyp    COPD (chronic obstructive pulmonary disease) (HCC)    Depression    Dyspnea    Edema    GERD (gastroesophageal reflux disease)    Gout    Hemorrhoids    Hyperlipidemia    Hypertension    Insomnia    a.) on hypnotic (zolpidem)   Long term current use of anticoagulant    a.) warfarin   OSA on CPAP    Pneumonia    Pulmonary HTN (HCC) 02/14/2016   a.) TTE 02/14/2016: RVSP 37.5 mmHg   Sick sinus syndrome (HCC)    Squamous cell carcinoma in situ 10/03/2009   L medial infrapectoral   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   L nose supratip   Stage 3b chronic kidney disease (CKD) (HCC)    Past Surgical History:  Procedure Laterality Date   CARDIOVERSION N/A 08/21/2004   Procedure: CIRECT CURRENT CARDIOVERSION; Location: [facility]; Surgeon: Harold Hedge, MD   CATARACT EXTRACTION, BILATERAL     CHOLECYSTECTOMY     COLONOSCOPY     IR EMBO TUMOR ORGAN ISCHEMIA INFARCT INC GUIDE ROADMAPPING  03/12/2023   IR RADIOLOGIST EVAL & MGMT  02/25/2023   IR RADIOLOGIST EVAL & MGMT  03/27/2023   NASAL SINUS SURGERY     Patient Active Problem List   Diagnosis Date Noted   Hyponatremia 12/19/2022   Sick sinus syndrome (HCC) 12/16/2022   Interstitial lung disease (HCC) 12/16/2022   Thoracic aortic aneurysm (HCC) 12/16/2022   Sepsis due to pneumonia (HCC) 12/16/2022   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  tophus 02/07/2022   Stage 3b chronic kidney disease (HCC) 02/07/2022   Hyperlipidemia 05/04/2015   Encounter for therapeutic drug monitoring 11/24/2013   Exertional dyspnea 11/23/2013   Chronic atrial fibrillation (HCC)    Pneumonia, organism unspecified(486) 08/17/2012    ONSET DATE: 2-3  REFERRING DIAG:  R26.81 (ICD-10-CM) - Unsteadiness on feet  R68.89 (ICD-10-CM) - Other general symptoms and signs  Z87.01 (ICD-10-CM) - Personal history of pneumonia (recurrent)    THERAPY DIAG:  No diagnosis found.  Rationale for Evaluation and Treatment: Rehabilitation  SUBJECTIVE:  SUBJECTIVE STATEMENT:  ***    Pt accompanied by:  wife- [name] and son  PERTINENT HISTORY: Patient is a 89 year old male with referall from PCP for imbalance and past medical history includes a recent hospitalization- 12/16/2022- 5 days for sepsis and PNA. PMH includes renal lesion, A-Fib, COPD, Depression, HTN, GOUT, Sleep apnea,  and HLD.  Wife reports he has been having balance issues for 2-3 years but having more unsteadiness recently  PAIN:  Are you having pain? No  PRECAUTIONS: Fall  RED FLAGS: None   WEIGHT BEARING RESTRICTIONS: No  FALLS: Has patient fallen in last 6 months?  No falls but several near falls.   LIVING ENVIRONMENT: Lives with: lives with their family- wife (son stays during the day)  Lives in: House/apartment Stairs: Yes: External: 3 steps; on right going up Has following equipment at home: Single point cane, Rollator  PLOF: Requires assistive device for independence and Needs assistance with ADLs  PATIENT GOALS: To improve my balance and not fall  OBJECTIVE:  Note: Objective measures were completed at Evaluation unless otherwise noted.  DIAGNOSTIC FINDINGS:  CT SCAN-  12/16/2022 IMPRESSION: 1. No evidence for aortic dissection or aneurysm. 2. Ascending thoracic aortic aneurysm measuring 4.3 x 3.7 cm. Recommend annual imaging followup by CTA or MRA. This recommendation follows 2010 ACCF/AHA/AATS/ACR/ASA/SCA/SCAI/SIR/STS/SVM Guidelines for the Diagnosis and Management of Patients with Thoracic Aortic Disease. Circulation. 2010; 121: Z610-R604. Aortic aneurysm NOS (ICD10-I71.9) 3. Pathologic mediastinal and right hilar lymphadenopathy of uncertain etiology. 4. Findings compatible with interstitial lung disease. 5. Ground-glass opacity in the right lower lobe measuring 3.3 cm. This may be infectious/inflammatory, but neoplasm can not be excluded. 6. 4.8 cm indeterminate right renal lesion, possibly complex cysts. Recommend further evaluation with dedicated CT or MRI. 7. Bladder diverticulum. 8. Colonic diverticulosis. 9. Prostatomegaly. 10. Small fat containing left inguinal hernia.     Electronically Signed   By: Darliss Cheney M.D.   On: 12/16/2022 23:30  COGNITION: Overall cognitive status: Impaired- forgetful   SENSATION: WFL  COORDINATION: Delayed at times with BLE  EDEMA:  None observed   POSTURE: rounded shoulders and forward head    LOWER EXTREMITY MMT:    MMT Right Eval Left Eval  Hip flexion 4 4  Hip extension    Hip abduction 4 4  Hip adduction 4 4  Hip internal rotation 4 4  Hip external rotation 4 4  Knee flexion 4 4  Knee extension 4 4  Ankle dorsiflexion 4 4  Ankle plantarflexion    Ankle inversion    Ankle eversion    (Blank rows = not tested)   TRANSFERS: Assistive device utilized: Single point cane  Sit to stand: Modified independence Stand to sit: Modified independence Chair to chair: Modified independence Floor:  Not tested   GAIT: Gait pattern: decreased arm swing- Right, decreased arm swing- Left, decreased step length- Right, decreased step length- Left, decreased stance time- Right,  decreased stance time- Left, and decreased stride length Distance walked: > 200 feet in total Assistive device utilized: Single point cane Level of assistance: CGA Comments: decreased gait speed and VC to pick up feet  FUNCTIONAL TESTS:  5 times sit to stand: 22.68 sec withuout  Timed up and go (TUG): 13.99 sec with cane 6 minute walk test: To be performed next visit 10 meter walk test: 0.59 m/s Berg Balance Scale: 45/56  PATIENT SURVEYS:  FOTO 54  TODAY'S TREATMENT:  DATE: 04/24/23  Physical therapy treatment session today consisted of completing assessment of goals and administration of testing as demonstrated and documented in flow sheet, treatment, and goals section of this note. Addition treatments may be found below.  TherEx: -STS with orange medicine ball ( 5KG) 2 x 10 -Scap retraction with BTB 2 x 10 reps -Shoulder ext with BTB  x 10 reps -Ambulation 2 x 300 ft with SPC, intermittent use, not proper pattern.   NMR:  -Forwards walking 40 ft x 2 without AD alternating with -forwards then Backwards walking 40 ft x 2 without cane -Step tap onto 1st step without UE support 2*10 reps each LE - airex SLS progression ( contralateral foot on step 2 x 30 sec ea LE  - Airex step on / off x 10 ea LE lateral, UE support throughout, difficulty with sequencing, fading cues   PATIENT EDUCATION: Education details: PT plan of care; goals Person educated: Patient Education method: Explanation Education comprehension: verbalized understanding  HOME EXERCISE PROGRAM:  Access Code: [email or web address] URL: [email or web address] Date: 03/20/2023 Prepared by: Maureen Ralphs  Exercises - Scapular Retraction with Resistance  - 1 x daily - 3 sets - 10 reps - Shoulder extension with resistance - Neutral  - 1 x daily - 7 x weekly - 3 sets - 10 reps   Access  Code: [email or web address] URL: [email or web address] Date: 02/04/2023 Prepared by: Precious Bard  Exercises - Seated March  - 1 x daily - 7 x weekly - 2 sets - 10 reps - 5 hold - Leg Extension  - 1 x daily - 7 x weekly - 2 sets - 10 reps - 3 hold - Seated Hip Abduction with Resistance  - 1 x daily - 7 x weekly - 2 sets - 10 reps - 5 hold  GOALS: Goals reviewed with patient? Yes  SHORT TERM GOALS: Target date: 03/13/2023  Pt will be independent with HEP in order to improve strength and balance in order to decrease fall risk and improve function at home and work.  Baseline: EVAL: No formal HEP in place Goal status: INITIAL    LONG TERM GOALS: Target date: 04/24/2023  1.  Patient (> 67 years old) will complete five times sit to stand test in < 15 seconds indicating an increased LE strength and improved balance. Baseline: EVAL: 22.68 without UE support Goal status: INITIAL  2.  Patient will increase FOTO score to equal to or greater than  55   to demonstrate statistically significant improvement in mobility and quality of life.  Baseline: EVAL=54 Goal status: INITIAL   3.  Patient will increase Berg Balance score by > 4 points to demonstrate decreased fall risk during functional activities. Baseline: 45 Goal status: INITIAL   4.   Patient will reduce timed up and go to <11 seconds to reduce fall risk and demonstrate improved transfer/gait ability. Baseline: EVAL=13.99 sec with cane Goal status: INITIAL  5.   Patient will increase 10 meter walk test to >1.67m/s as to improve gait speed for better community ambulation and to reduce fall risk. Baseline: EVAL= 0.58 m/s Goal status: INITIAL  6.   Patient will increase six minute walk test distance to >1000 for progression to community ambulator and improve gait ability Baseline: EVAL: to be assessed 2nd visit 10/29: 715 ft  Goal status: INITIAL   ASSESSMENT:  CLINICAL IMPRESSION:  ***The anticipated improvement is  attainable and reasonable in a generally predictable time.      OBJECTIVE IMPAIRMENTS: Abnormal gait,  decreased activity tolerance, decreased balance, decreased cognition, decreased coordination, decreased endurance, decreased mobility, difficulty walking, decreased strength, and impaired flexibility.   ACTIVITY LIMITATIONS: carrying, lifting, bending, sitting, standing, squatting, stairs, and transfers  PARTICIPATION LIMITATIONS: cleaning, laundry, shopping, community activity, and yard work  PERSONAL FACTORS: Age and 1-2 comorbidities: COPD, HTN  are also affecting patient's functional outcome.   REHAB POTENTIAL: Good  CLINICAL DECISION MAKING: Evolving/moderate complexity  EVALUATION COMPLEXITY: Moderate  PLAN:  PT FREQUENCY: 1-2x/week  PT DURATION: 12 weeks  PLANNED INTERVENTIONS: [id number]- PT Re-evaluation, 97110-Therapeutic exercises, 97530- Therapeutic activity, 97112- Neuromuscular re-education, 97535- Self Care, 62130- Manual therapy, [phone]- Gait training, [phone]- Orthotic Fit/training, [phone]- Canalith repositioning, [phone]- Electrical stimulation (manual), Patient/Family education, Balance training, Stair training, Taping, Dry Needling, Joint mobilization, Spinal mobilization, Vestibular training, Visual/preceptual remediation/compensation, DME instructions, Cryotherapy, and Moist heat  PLAN FOR NEXT SESSION:  balance and LE strengthening    Norman Herrlich, PT 04/24/2023, 9:24 AM

## 2023-04-28 ENCOUNTER — Ambulatory Visit: Payer: No Typology Code available for payment source

## 2023-04-28 ENCOUNTER — Ambulatory Visit: Payer: No Typology Code available for payment source | Admitting: Physical Therapy

## 2023-04-29 ENCOUNTER — Ambulatory Visit: Payer: No Typology Code available for payment source | Admitting: Physical Therapy

## 2023-04-29 DIAGNOSIS — I482 Chronic atrial fibrillation, unspecified: Secondary | ICD-10-CM | POA: Diagnosis not present

## 2023-04-29 DIAGNOSIS — J849 Interstitial pulmonary disease, unspecified: Secondary | ICD-10-CM | POA: Diagnosis not present

## 2023-04-30 ENCOUNTER — Ambulatory Visit: Payer: No Typology Code available for payment source | Admitting: Physical Therapy

## 2023-05-01 ENCOUNTER — Ambulatory Visit: Payer: No Typology Code available for payment source

## 2023-05-05 ENCOUNTER — Ambulatory Visit: Payer: No Typology Code available for payment source

## 2023-05-06 ENCOUNTER — Ambulatory Visit: Payer: No Typology Code available for payment source | Attending: Family Medicine

## 2023-05-06 DIAGNOSIS — R262 Difficulty in walking, not elsewhere classified: Secondary | ICD-10-CM | POA: Insufficient documentation

## 2023-05-06 DIAGNOSIS — R2689 Other abnormalities of gait and mobility: Secondary | ICD-10-CM | POA: Insufficient documentation

## 2023-05-06 DIAGNOSIS — R2681 Unsteadiness on feet: Secondary | ICD-10-CM | POA: Diagnosis not present

## 2023-05-06 DIAGNOSIS — R269 Unspecified abnormalities of gait and mobility: Secondary | ICD-10-CM | POA: Insufficient documentation

## 2023-05-06 DIAGNOSIS — M6281 Muscle weakness (generalized): Secondary | ICD-10-CM | POA: Diagnosis not present

## 2023-05-06 DIAGNOSIS — R278 Other lack of coordination: Secondary | ICD-10-CM | POA: Insufficient documentation

## 2023-05-06 NOTE — Therapy (Signed)
OUTPATIENT PHYSICAL THERAPY NEURO TREATMENT/RECERT   Patient Name: Craig Gilmore MRN: [id number] DOB:1934/09/25, 89 y.o., male Today's Date: 05/07/2023   PCP: Dr. Jerl Mina   REFERRING PROVIDER: Dr. Jerl Mina  END OF SESSION:  PT End of Session - 05/06/23 1532     Visit Number 11    Number of Visits 24    Date for PT Re-Evaluation 07/29/23    Progress Note Due on Visit 20    PT Start Time 1532    PT Stop Time 1613    PT Time Calculation (min) 41 min    Equipment Utilized During Treatment Gait belt    Activity Tolerance Patient tolerated treatment well    Behavior During Therapy WFL for tasks assessed/performed                      Past Medical History:  Diagnosis Date   Chronic atrial fibrillation (HCC)    a.) CHA2DS2VASc = 3 (age x 2, HTN);  b.) s/p DCCV 08/20/2004 --> 50 J x 1 and 100 J x 1 --> coverted with SB. (+) post-procedure nausea; c.) rate/rhythm maintained without pharmacological intervention; chronically anticoagulated with warfarin   Colonic polyp    COPD (chronic obstructive pulmonary disease) (HCC)    Depression    Dyspnea    Edema    GERD (gastroesophageal reflux disease)    Gout    Hemorrhoids    Hyperlipidemia    Hypertension    Insomnia    a.) on hypnotic (zolpidem)   Long term current use of anticoagulant    a.) warfarin   OSA on CPAP    Pneumonia    Pulmonary HTN (HCC) 02/14/2016   a.) TTE 02/14/2016: RVSP 37.5 mmHg   Sick sinus syndrome (HCC)    Squamous cell carcinoma in situ 10/03/2009   L medial infrapectoral   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   L nose supratip   Stage 3b chronic kidney disease (CKD) (HCC)    Past Surgical History:  Procedure Laterality Date   CARDIOVERSION N/A 08/21/2004   Procedure: CIRECT CURRENT CARDIOVERSION; Location: [facility]; Surgeon: Harold Hedge, MD   CATARACT EXTRACTION, BILATERAL     CHOLECYSTECTOMY     COLONOSCOPY     IR EMBO TUMOR ORGAN ISCHEMIA INFARCT INC GUIDE  ROADMAPPING  03/12/2023   IR RADIOLOGIST EVAL & MGMT  02/25/2023   IR RADIOLOGIST EVAL & MGMT  03/27/2023   NASAL SINUS SURGERY     Patient Active Problem List   Diagnosis Date Noted   Hyponatremia 12/19/2022   Sick sinus syndrome (HCC) 12/16/2022   Interstitial lung disease (HCC) 12/16/2022   Thoracic aortic aneurysm (HCC) 12/16/2022   Sepsis due to pneumonia (HCC) 12/16/2022   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 tophus 02/07/2022   Stage 3b chronic kidney disease (HCC) 02/07/2022   Hyperlipidemia 05/04/2015   Encounter for therapeutic drug monitoring 11/24/2013   Exertional dyspnea 11/23/2013   Chronic atrial fibrillation (HCC)    Pneumonia, organism unspecified(486) 08/17/2012    ONSET DATE: 2-3  REFERRING DIAG:  R26.81 (ICD-10-CM) - Unsteadiness on feet  R68.89 (ICD-10-CM) - Other general symptoms and signs  Z87.01 (ICD-10-CM) - Personal history of pneumonia (recurrent)    THERAPY DIAG:  Abnormality of gait and mobility  Unsteadiness on feet  Muscle weakness (generalized)  Difficulty in walking, not elsewhere classified  Other abnormalities of gait and mobility  Other lack of coordination  Rationale for Evaluation and Treatment: Rehabilitation  SUBJECTIVE:  SUBJECTIVE STATEMENT:  Patient reports feeling better- had a recent illness and didn't want to come out in the freezing cold for past couple of weeks.     Pt accompanied by:  wife- Craig Gilmore   PERTINENT HISTORY: Patient is a 89 year old male with referall from PCP for imbalance and past medical history includes a recent hospitalization- 12/16/2022- 5 days for sepsis and PNA. PMH includes renal lesion, A-Fib, COPD, Depression, HTN, GOUT, Sleep apnea,  and HLD.  Wife reports he has been having balance issues for 2-3 years but  having more unsteadiness recently  PAIN:  Are you having pain? No  PRECAUTIONS: Fall  RED FLAGS: None   WEIGHT BEARING RESTRICTIONS: No  FALLS: Has patient fallen in last 6 months?  No falls but several near falls.   LIVING ENVIRONMENT: Lives with: lives with their family- wife (son stays during the day)  Lives in: House/apartment Stairs: Yes: External: 3 steps; on right going up Has following equipment at home: Single point cane, Rollator  PLOF: Requires assistive device for independence and Needs assistance with ADLs  PATIENT GOALS: To improve my balance and not fall  OBJECTIVE:  Note: Objective measures were completed at Evaluation unless otherwise noted.  DIAGNOSTIC FINDINGS:  CT SCAN- 12/16/2022 IMPRESSION: 1. No evidence for aortic dissection or aneurysm. 2. Ascending thoracic aortic aneurysm measuring 4.3 x 3.7 cm. Recommend annual imaging followup by CTA or MRA. This recommendation follows 2010 ACCF/AHA/AATS/ACR/ASA/SCA/SCAI/SIR/STS/SVM Guidelines for the Diagnosis and Management of Patients with Thoracic Aortic Disease. Circulation. 2010; 121: U981-X914. Aortic aneurysm NOS (ICD10-I71.9) 3. Pathologic mediastinal and right hilar lymphadenopathy of uncertain etiology. 4. Findings compatible with interstitial lung disease. 5. Ground-glass opacity in the right lower lobe measuring 3.3 cm. This may be infectious/inflammatory, but neoplasm can not be excluded. 6. 4.8 cm indeterminate right renal lesion, possibly complex cysts. Recommend further evaluation with dedicated CT or MRI. 7. Bladder diverticulum. 8. Colonic diverticulosis. 9. Prostatomegaly. 10. Small fat containing left inguinal hernia.     Electronically Signed   By: Darliss Cheney M.D.   On: 12/16/2022 23:30  COGNITION: Overall cognitive status: Impaired- forgetful   SENSATION: WFL  COORDINATION: Delayed at times with BLE  EDEMA:  None observed   POSTURE: rounded shoulders and forward  head    LOWER EXTREMITY MMT:    MMT Right Eval Left Eval  Hip flexion 4 4  Hip extension    Hip abduction 4 4  Hip adduction 4 4  Hip internal rotation 4 4  Hip external rotation 4 4  Knee flexion 4 4  Knee extension 4 4  Ankle dorsiflexion 4 4  Ankle plantarflexion    Ankle inversion    Ankle eversion    (Blank rows = not tested)   TRANSFERS: Assistive device utilized: Single point cane  Sit to stand: Modified independence Stand to sit: Modified independence Chair to chair: Modified independence Floor:  Not tested   GAIT: Gait pattern: decreased arm swing- Right, decreased arm swing- Left, decreased step length- Right, decreased step length- Left, decreased stance time- Right, decreased stance time- Left, and decreased stride length Distance walked: > 200 feet in total Assistive device utilized: Single point cane Level of assistance: CGA Comments: decreased gait speed and VC to pick up feet  FUNCTIONAL TESTS:  5 times sit to stand: 22.68 sec withuout  Timed up and go (TUG): 13.99 sec with cane 6 minute walk test: To be performed next visit 10 meter walk test: 0.59 m/s [employer]  Scale: 45/56  PATIENT SURVEYS:  FOTO 54  TODAY'S TREATMENT:                                                                                                                              DATE: 05/07/23  Physical therapy treatment session today consisted of completing assessment of goals and administration of testing as demonstrated and documented in flow sheet, treatment, and goals section of this note. Addition treatments may be found below.    [facility] PT Assessment - 05/07/23 0833       Berg Balance Test   Sit to Stand Able to stand without using hands and stabilize independently    Standing Unsupported Able to stand safely 2 minutes    Sitting with Back Unsupported but Feet Supported on Floor or Stool Able to sit safely and securely 2 minutes    Stand to Sit Sits safely with  minimal use of hands    Transfers Able to transfer safely, minor use of hands    Standing Unsupported with Eyes Closed Able to stand 10 seconds safely    Standing Unsupported with Feet Together Able to place feet together independently and stand 1 minute safely    From Standing, Reach Forward with Outstretched Arm Can reach confidently >25 cm (10")    From Standing Position, Pick up Object from Floor Able to pick up shoe safely and easily    From Standing Position, Turn to Look Behind Over each Shoulder Turn sideways only but maintains balance    Turn 360 Degrees Able to turn 360 degrees safely but slowly    Standing Unsupported, Alternately Place Feet on Step/Stool Able to stand independently and safely and complete 8 steps in 20 seconds    Standing Unsupported, One Foot in Front Able to take small step independently and hold 30 seconds    Standing on One Leg Tries to lift leg/unable to hold 3 seconds but remains standing independently    Total Score 47                PATIENT EDUCATION: Education details: PT plan of care; goals Person educated: Patient Education method: Explanation Education comprehension: verbalized understanding  HOME EXERCISE PROGRAM:  Access Code: [email or web address] URL: [email or web address] Date: 03/20/2023 Prepared by: Maureen Ralphs  Exercises - Scapular Retraction with Resistance  - 1 x daily - 3 sets - 10 reps - Shoulder extension with resistance - Neutral  - 1 x daily - 7 x weekly - 3 sets - 10 reps   Access Code: [email or web address] URL: [email or web address] Date: 02/04/2023 Prepared by: Precious Bard  Exercises - Seated March  - 1 x daily - 7 x weekly - 2 sets - 10 reps - 5 hold - Leg Extension  - 1 x daily - 7 x weekly - 2 sets - 10 reps - 3 hold - Seated Hip Abduction with Resistance  - 1 x daily - 7 x weekly - 2 sets - 10  reps - 5 hold  GOALS: Goals reviewed with patient? Yes  SHORT TERM GOALS: Target date:  03/13/2023  Pt will be independent with HEP in order to improve strength and balance in order to decrease fall risk and improve function at home and work.  Baseline: EVAL: No formal HEP in place; 05/06/2023= Patient reports knowledgeable of HEP but states has not been very compliant.  Goal status: Partially Met    LONG TERM GOALS: Target date: 04/24/2023  1.  Patient (> 87 years old) will complete five times sit to stand test in < 15 seconds indicating an increased LE strength and improved balance. Baseline: EVAL: 22.68 without UE support; 05/06/2023= 16.79 sec without UE support Goal status: PROGRESSING  2.  Patient will increase FOTO score to equal to or greater than  55   to demonstrate statistically significant improvement in mobility and quality of life.  Baseline: EVAL=54; 05/06/2023=62 Goal status: MET   3.  Patient will increase Berg Balance score by > 4 points to demonstrate decreased fall risk during functional activities. Baseline: 45; 05/06/2023= 47/56 Goal status: PROGRESSING   4.   Patient will reduce timed up and go to <11 seconds to reduce fall risk and demonstrate improved transfer/gait ability. Baseline: EVAL=13.99 sec with cane; 05/06/2023= 13.01 sec without a cane.  Goal status: PROGRESSING  5.   Patient will increase 10 meter walk test to >1.78m/s as to improve gait speed for better community ambulation and to reduce fall risk. Baseline: EVAL= 0.58 m/s; 05/06/2023= 0.75 m/s without an AD Goal status: PROGRESSING  6.   Patient will increase six minute walk test distance to >1000 for progression to community ambulator and improve gait ability Baseline: EVAL: to be assessed 2nd visit 10/29: 715 ft; 05/06/2023= 750 feet in 3 min 54 sec without AD Goal status: PROGRESSING    ASSESSMENT:  CLINICAL IMPRESSION: Patient presents with good motivation for today's recert visit. He performed well despite being sick recently. He was able to demonstrate progress overall- able to  walk with some unsteadiness yet no device today- continuing to exhibit fatigue as limiting factor- unable to walk 4 min without rest break today. He did demonstrate improvement in self perceived condition as seen by improved FOTO score. He also demonstrated improved gait speed, improved LE strength (as seen by improved 5 x STS test), and improved timed up and go test. Patient's condition has the potential to improve in response to therapy. Maximum improvement is yet to be obtained. The anticipated improvement is attainable and reasonable in a generally predictable time.  Pt will continue to benefit from skilled physical therapy intervention to address impairments, improve QOL, and attain therapy goals      OBJECTIVE IMPAIRMENTS: Abnormal gait, decreased activity tolerance, decreased balance, decreased cognition, decreased coordination, decreased endurance, decreased mobility, difficulty walking, decreased strength, and impaired flexibility.   ACTIVITY LIMITATIONS: carrying, lifting, bending, sitting, standing, squatting, stairs, and transfers  PARTICIPATION LIMITATIONS: cleaning, laundry, shopping, community activity, and yard work  PERSONAL FACTORS: Age and 1-2 comorbidities: COPD, HTN  are also affecting patient's functional outcome.   REHAB POTENTIAL: Good  CLINICAL DECISION MAKING: Evolving/moderate complexity  EVALUATION COMPLEXITY: Moderate  PLAN:  PT FREQUENCY: 1-2x/week  PT DURATION: 12 weeks  PLANNED INTERVENTIONS: 97164- PT Re-evaluation, 97110-Therapeutic exercises, 97530- Therapeutic activity, 97112- Neuromuscular re-education, 97535- Self Care, 16109- Manual therapy, [phone]- Gait training, [phone]- Orthotic Fit/training, [phone]- Canalith repositioning, [phone]- Electrical stimulation (manual), Patient/Family education, Balance training, Stair training, Taping, Dry Needling, Joint mobilization, Spinal mobilization,  Vestibular training, Visual/preceptual remediation/compensation, DME  instructions, Cryotherapy, and Moist heat  PLAN FOR NEXT SESSION:  Continue with balance training, and LE strengthening/conditioning to improve his overall functional endurance.    Lenda Kelp, PT 05/07/2023, 8:42 AM

## 2023-05-07 ENCOUNTER — Ambulatory Visit: Payer: No Typology Code available for payment source | Admitting: Physical Therapy

## 2023-05-07 NOTE — Therapy (Signed)
OUTPATIENT PHYSICAL THERAPY NEURO TREATMENT  Patient Name: Craig Gilmore MRN: [id number] DOB:01/04/1935, 89 y.o., male Today's Date: 05/08/2023   PCP: Dr. Jerl Mina   REFERRING PROVIDER: Dr. Jerl Mina  END OF SESSION:  PT End of Session - 05/08/23 1358     Visit Number 12    Number of Visits 24    Date for PT Re-Evaluation 07/29/23    Progress Note Due on Visit 20    PT Start Time 1359    PT Stop Time 1444    PT Time Calculation (min) 45 min    Equipment Utilized During Treatment Gait belt    Activity Tolerance Patient tolerated treatment well    Behavior During Therapy WFL for tasks assessed/performed                       Past Medical History:  Diagnosis Date   Chronic atrial fibrillation (HCC)    a.) CHA2DS2VASc = 3 (age x 2, HTN);  b.) s/p DCCV 08/20/2004 --> 50 J x 1 and 100 J x 1 --> coverted with SB. (+) post-procedure nausea; c.) rate/rhythm maintained without pharmacological intervention; chronically anticoagulated with warfarin   Colonic polyp    COPD (chronic obstructive pulmonary disease) (HCC)    Depression    Dyspnea    Edema    GERD (gastroesophageal reflux disease)    Gout    Hemorrhoids    Hyperlipidemia    Hypertension    Insomnia    a.) on hypnotic (zolpidem)   Long term current use of anticoagulant    a.) warfarin   OSA on CPAP    Pneumonia    Pulmonary HTN (HCC) 02/14/2016   a.) TTE 02/14/2016: RVSP 37.5 mmHg   Sick sinus syndrome (HCC)    Squamous cell carcinoma in situ 10/03/2009   L medial infrapectoral   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   L nose supratip   Stage 3b chronic kidney disease (CKD) (HCC)    Past Surgical History:  Procedure Laterality Date   CARDIOVERSION N/A 08/21/2004   Procedure: CIRECT CURRENT CARDIOVERSION; Location: [facility]; Surgeon: Harold Hedge, MD   CATARACT EXTRACTION, BILATERAL     CHOLECYSTECTOMY     COLONOSCOPY     IR EMBO TUMOR ORGAN ISCHEMIA INFARCT INC GUIDE ROADMAPPING   03/12/2023   IR RADIOLOGIST EVAL & MGMT  02/25/2023   IR RADIOLOGIST EVAL & MGMT  03/27/2023   NASAL SINUS SURGERY     Patient Active Problem List   Diagnosis Date Noted   Hyponatremia 12/19/2022   Sick sinus syndrome (HCC) 12/16/2022   Interstitial lung disease (HCC) 12/16/2022   Thoracic aortic aneurysm (HCC) 12/16/2022   Sepsis due to pneumonia (HCC) 12/16/2022   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 tophus 02/07/2022   Stage 3b chronic kidney disease (HCC) 02/07/2022   Hyperlipidemia 05/04/2015   Encounter for therapeutic drug monitoring 11/24/2013   Exertional dyspnea 11/23/2013   Chronic atrial fibrillation (HCC)    Pneumonia, organism unspecified(486) 08/17/2012    ONSET DATE: 2-3  REFERRING DIAG:  R26.81 (ICD-10-CM) - Unsteadiness on feet  R68.89 (ICD-10-CM) - Other general symptoms and signs  Z87.01 (ICD-10-CM) - Personal history of pneumonia (recurrent)    THERAPY DIAG:  Abnormality of gait and mobility  Unsteadiness on feet  Muscle weakness (generalized)  Difficulty in walking, not elsewhere classified  Rationale for Evaluation and Treatment: Rehabilitation  SUBJECTIVE:  SUBJECTIVE STATEMENT:  Patient presents with wife.     Pt accompanied by:  wife- Craig Gilmore   PERTINENT HISTORY: Patient is a 89 year old male with referall from PCP for imbalance and past medical history includes a recent hospitalization- 12/16/2022- 5 days for sepsis and PNA. PMH includes renal lesion, A-Fib, COPD, Depression, HTN, GOUT, Sleep apnea,  and HLD.  Wife reports he has been having balance issues for 2-3 years but having more unsteadiness recently  PAIN:  Are you having pain? No  PRECAUTIONS: Fall  RED FLAGS: None   WEIGHT BEARING RESTRICTIONS: No  FALLS: Has patient fallen in last 6  months?  No falls but several near falls.   LIVING ENVIRONMENT: Lives with: lives with their family- wife (son stays during the day)  Lives in: House/apartment Stairs: Yes: External: 3 steps; on right going up Has following equipment at home: Single point cane, Rollator  PLOF: Requires assistive device for independence and Needs assistance with ADLs  PATIENT GOALS: To improve my balance and not fall  OBJECTIVE:  Note: Objective measures were completed at Evaluation unless otherwise noted.  DIAGNOSTIC FINDINGS:  CT SCAN- 12/16/2022 IMPRESSION: 1. No evidence for aortic dissection or aneurysm. 2. Ascending thoracic aortic aneurysm measuring 4.3 x 3.7 cm. Recommend annual imaging followup by CTA or MRA. This recommendation follows 2010 ACCF/AHA/AATS/ACR/ASA/SCA/SCAI/SIR/STS/SVM Guidelines for the Diagnosis and Management of Patients with Thoracic Aortic Disease. Circulation. 2010; 121: X914-N829. Aortic aneurysm NOS (ICD10-I71.9) 3. Pathologic mediastinal and right hilar lymphadenopathy of uncertain etiology. 4. Findings compatible with interstitial lung disease. 5. Ground-glass opacity in the right lower lobe measuring 3.3 cm. This may be infectious/inflammatory, but neoplasm can not be excluded. 6. 4.8 cm indeterminate right renal lesion, possibly complex cysts. Recommend further evaluation with dedicated CT or MRI. 7. Bladder diverticulum. 8. Colonic diverticulosis. 9. Prostatomegaly. 10. Small fat containing left inguinal hernia.     Electronically Signed   By: Darliss Cheney M.D.   On: 12/16/2022 23:30  COGNITION: Overall cognitive status: Impaired- forgetful   SENSATION: WFL  COORDINATION: Delayed at times with BLE  EDEMA:  None observed   POSTURE: rounded shoulders and forward head    LOWER EXTREMITY MMT:    MMT Right Eval Left Eval  Hip flexion 4 4  Hip extension    Hip abduction 4 4  Hip adduction 4 4  Hip internal rotation 4 4  Hip external  rotation 4 4  Knee flexion 4 4  Knee extension 4 4  Ankle dorsiflexion 4 4  Ankle plantarflexion    Ankle inversion    Ankle eversion    (Blank rows = not tested)   TRANSFERS: Assistive device utilized: Single point cane  Sit to stand: Modified independence Stand to sit: Modified independence Chair to chair: Modified independence Floor:  Not tested   GAIT: Gait pattern: decreased arm swing- Right, decreased arm swing- Left, decreased step length- Right, decreased step length- Left, decreased stance time- Right, decreased stance time- Left, and decreased stride length Distance walked: > 200 feet in total Assistive device utilized: Single point cane Level of assistance: CGA Comments: decreased gait speed and VC to pick up feet  FUNCTIONAL TESTS:  5 times sit to stand: 22.68 sec withuout  Timed up and go (TUG): 13.99 sec with cane 6 minute walk test: To be performed next visit 10 meter walk test: 0.59 m/s Berg Balance Scale: 45/56  PATIENT SURVEYS:  FOTO 54  TODAY'S TREATMENT:  DATE: 05/08/23  TherEx: -STS 2x10 -Scap retraction with GTB 2 x 10 rep -3lb AW 150 ft with CGA   -GTB hamstring curl 15x each LE   NMR:  Standing with CGA next to support surface:  Airex pad: static stand 30 seconds x 2 trials, noticeable trembling of ankles/LE's with fatigue and challenge to maintain stability Airex pad: horizontal head turns 30 seconds scanning room 10x ; cueing for arc of motion  Airex pad: vertical head turns 30 seconds, cueing for arc of motion, noticeable sway with upward gaze increasing demand on ankle righting reaction musculature Airex pad: dual task word game placing in alphabetical order x8 minutes   Step over half foam roller 15x each LE with UE support 6" step toe taps 10x each LE, no UE support   PATIENT EDUCATION: Education details: PT  plan of care; goals Person educated: Patient Education method: Explanation Education comprehension: verbalized understanding  HOME EXERCISE PROGRAM:  Access Code: [email or web address] URL: [email or web address] Date: 03/20/2023 Prepared by: Maureen Ralphs  Exercises - Scapular Retraction with Resistance  - 1 x daily - 3 sets - 10 reps - Shoulder extension with resistance - Neutral  - 1 x daily - 7 x weekly - 3 sets - 10 reps   Access Code: [email or web address] URL: [email or web address] Date: 02/04/2023 Prepared by: Precious Bard  Exercises - Seated March  - 1 x daily - 7 x weekly - 2 sets - 10 reps - 5 hold - Leg Extension  - 1 x daily - 7 x weekly - 2 sets - 10 reps - 3 hold - Seated Hip Abduction with Resistance  - 1 x daily - 7 x weekly - 2 sets - 10 reps - 5 hold  GOALS: Goals reviewed with patient? Yes  SHORT TERM GOALS: Target date: 03/13/2023  Pt will be independent with HEP in order to improve strength and balance in order to decrease fall risk and improve function at home and work.  Baseline: EVAL: No formal HEP in place; 05/06/2023= Patient reports knowledgeable of HEP but states has not been very compliant.  Goal status: Partially Met    LONG TERM GOALS: Target date: 07/29/23  1.  Patient (> 49 years old) will complete five times sit to stand test in < 15 seconds indicating an increased LE strength and improved balance. Baseline: EVAL: 22.68 without UE support; 05/06/2023= 16.79 sec without UE support Goal status: PROGRESSING  2.  Patient will increase FOTO score to equal to or greater than  55   to demonstrate statistically significant improvement in mobility and quality of life.  Baseline: EVAL=54; 05/06/2023=62 Goal status: MET   3.  Patient will increase Berg Balance score by > 4 points to demonstrate decreased fall risk during functional activities. Baseline: 45; 05/06/2023= 47/56 Goal status: PROGRESSING   4.   Patient will reduce timed up and  go to <11 seconds to reduce fall risk and demonstrate improved transfer/gait ability. Baseline: EVAL=13.99 sec with cane; 05/06/2023= 13.01 sec without a cane.  Goal status: PROGRESSING  5.   Patient will increase 10 meter walk test to >1.72m/s as to improve gait speed for better community ambulation and to reduce fall risk. Baseline: EVAL= 0.58 m/s; 05/06/2023= 0.75 m/s without an AD Goal status: PROGRESSING  6.   Patient will increase six minute walk test distance to >1000 for progression to community ambulator and improve gait ability Baseline: EVAL: to be assessed 2nd visit 10/29: 715 ft; 05/06/2023= 750 feet in 3 min 54 sec  without AD Goal status: PROGRESSING    ASSESSMENT:  CLINICAL IMPRESSION: Patient tolerates progressive strengthening and mobility throughout session. He does report being fatigued from prolonged ambulation prior to PT session. He is eager to become more steady and not have as much assistance.  Pt will continue to benefit from skilled physical therapy intervention to address impairments, improve QOL, and attain therapy goals      OBJECTIVE IMPAIRMENTS: Abnormal gait, decreased activity tolerance, decreased balance, decreased cognition, decreased coordination, decreased endurance, decreased mobility, difficulty walking, decreased strength, and impaired flexibility.   ACTIVITY LIMITATIONS: carrying, lifting, bending, sitting, standing, squatting, stairs, and transfers  PARTICIPATION LIMITATIONS: cleaning, laundry, shopping, community activity, and yard work  PERSONAL FACTORS: Age and 1-2 comorbidities: COPD, HTN  are also affecting patient's functional outcome.   REHAB POTENTIAL: Good  CLINICAL DECISION MAKING: Evolving/moderate complexity  EVALUATION COMPLEXITY: Moderate  PLAN:  PT FREQUENCY: 1-2x/week  PT DURATION: 12 weeks  PLANNED INTERVENTIONS: 97164- PT Re-evaluation, 97110-Therapeutic exercises, 97530- Therapeutic activity, [id number]- Neuromuscular  re-education, 97535- Self Care, 16109- Manual therapy, [id number]- Gait training, [phone]- Orthotic Fit/training, [phone]- Canalith repositioning, [phone]- Electrical stimulation (manual), Patient/Family education, Balance training, Stair training, Taping, Dry Needling, Joint mobilization, Spinal mobilization, Vestibular training, Visual/preceptual remediation/compensation, DME instructions, Cryotherapy, and Moist heat  PLAN FOR NEXT SESSION:  Continue with balance training, and LE strengthening/conditioning to improve his overall functional endurance.    Precious Bard, PT 05/08/2023, 4:36 PM

## 2023-05-08 ENCOUNTER — Ambulatory Visit: Payer: No Typology Code available for payment source

## 2023-05-08 DIAGNOSIS — M6281 Muscle weakness (generalized): Secondary | ICD-10-CM

## 2023-05-08 DIAGNOSIS — R262 Difficulty in walking, not elsewhere classified: Secondary | ICD-10-CM

## 2023-05-08 DIAGNOSIS — R269 Unspecified abnormalities of gait and mobility: Secondary | ICD-10-CM | POA: Diagnosis not present

## 2023-05-08 DIAGNOSIS — Z7901 Long term (current) use of anticoagulants: Secondary | ICD-10-CM | POA: Diagnosis not present

## 2023-05-08 DIAGNOSIS — I482 Chronic atrial fibrillation, unspecified: Secondary | ICD-10-CM | POA: Diagnosis not present

## 2023-05-08 DIAGNOSIS — R2681 Unsteadiness on feet: Secondary | ICD-10-CM

## 2023-05-09 ENCOUNTER — Telehealth: Payer: Self-pay | Admitting: Physician Assistant

## 2023-05-09 NOTE — Telephone Encounter (Signed)
Appt with sam was pushed out to after CT appt. Pt's wife aware

## 2023-05-09 NOTE — Telephone Encounter (Signed)
His wife called and his appt is on 4/10 at 11:20 but his Renal Ultrasound in on 4/14 @ 2:00pm. She thought you wanted him to have his ultrasound before his appt. So she wants to know if you want to move his office visit out after his ultrasound?

## 2023-05-12 ENCOUNTER — Ambulatory Visit: Payer: No Typology Code available for payment source

## 2023-05-13 ENCOUNTER — Ambulatory Visit: Payer: No Typology Code available for payment source | Attending: Family Medicine | Admitting: Physical Therapy

## 2023-05-13 DIAGNOSIS — R269 Unspecified abnormalities of gait and mobility: Secondary | ICD-10-CM | POA: Insufficient documentation

## 2023-05-13 DIAGNOSIS — R278 Other lack of coordination: Secondary | ICD-10-CM | POA: Diagnosis not present

## 2023-05-13 DIAGNOSIS — R2681 Unsteadiness on feet: Secondary | ICD-10-CM | POA: Diagnosis not present

## 2023-05-13 DIAGNOSIS — M6281 Muscle weakness (generalized): Secondary | ICD-10-CM | POA: Insufficient documentation

## 2023-05-13 DIAGNOSIS — R2689 Other abnormalities of gait and mobility: Secondary | ICD-10-CM | POA: Insufficient documentation

## 2023-05-13 DIAGNOSIS — R262 Difficulty in walking, not elsewhere classified: Secondary | ICD-10-CM | POA: Insufficient documentation

## 2023-05-13 NOTE — Therapy (Signed)
 OUTPATIENT PHYSICAL THERAPY NEURO TREATMENT  Patient Name: Craig Gilmore MRN: [id number] DOB:29-Mar-1935, 89 y.o., male Today's Date: 05/13/2023   PCP: Dr. Lynwood Null   REFERRING PROVIDER: Dr. Lynwood Null  END OF SESSION:  PT End of Session - 05/13/23 1142     Visit Number 13    Number of Visits 24    Date for PT Re-Evaluation 07/29/23    Progress Note Due on Visit 20    PT Start Time 1145    PT Stop Time 1224    PT Time Calculation (min) 39 min    Equipment Utilized During Treatment Gait belt    Activity Tolerance Patient tolerated treatment well    Behavior During Therapy WFL for tasks assessed/performed                        Past Medical History:  Diagnosis Date   Chronic atrial fibrillation (HCC)    a.) CHA2DS2VASc = 3 (age x 2, HTN);  b.) s/p DCCV 08/20/2004 --> 50 J x 1 and 100 J x 1 --> coverted with SB. (+) post-procedure nausea; c.) rate/rhythm maintained without pharmacological intervention; chronically anticoagulated with warfarin   Colonic polyp    COPD (chronic obstructive pulmonary disease) (HCC)    Depression    Dyspnea    Edema    GERD (gastroesophageal reflux disease)    Gout    Hemorrhoids    Hyperlipidemia    Hypertension    Insomnia    a.) on hypnotic (zolpidem )   Long term current use of anticoagulant    a.) warfarin   OSA on CPAP    Pneumonia    Pulmonary HTN (HCC) 02/14/2016   a.) TTE 02/14/2016: RVSP 37.5 mmHg   Sick sinus syndrome (HCC)    Squamous cell carcinoma in situ 10/03/2009   L medial infrapectoral   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   L nose supratip   Stage 3b chronic kidney disease (CKD) (HCC)    Past Surgical History:  Procedure Laterality Date   CARDIOVERSION N/A 08/21/2004   Procedure: CIRECT CURRENT CARDIOVERSION; Location: [facility]; Surgeon: Vinie Jude, MD   CATARACT EXTRACTION, BILATERAL     CHOLECYSTECTOMY     COLONOSCOPY     IR EMBO TUMOR ORGAN ISCHEMIA INFARCT INC GUIDE ROADMAPPING   03/12/2023   IR RADIOLOGIST EVAL & MGMT  02/25/2023   IR RADIOLOGIST EVAL & MGMT  03/27/2023   NASAL SINUS SURGERY     Patient Active Problem List   Diagnosis Date Noted   Hyponatremia 12/19/2022   Sick sinus syndrome (HCC) 12/16/2022   Interstitial lung disease (HCC) 12/16/2022   Thoracic aortic aneurysm (HCC) 12/16/2022   Sepsis due to pneumonia (HCC) 12/16/2022   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 tophus 02/07/2022   Stage 3b chronic kidney disease (HCC) 02/07/2022   Hyperlipidemia 05/04/2015   Encounter for therapeutic drug monitoring 11/24/2013   Exertional dyspnea 11/23/2013   Chronic atrial fibrillation (HCC)    Pneumonia, organism unspecified(486) 08/17/2012    ONSET DATE: 2-3  REFERRING DIAG:  R26.81 (ICD-10-CM) - Unsteadiness on feet  R68.89 (ICD-10-CM) - Other general symptoms and signs  Z87.01 (ICD-10-CM) - Personal history of pneumonia (recurrent)    THERAPY DIAG:  Abnormality of gait and mobility  Unsteadiness on feet  Muscle weakness (generalized)  Difficulty in walking, not elsewhere classified  Other abnormalities of gait and mobility  Rationale for Evaluation and Treatment: Rehabilitation  SUBJECTIVE:  SUBJECTIVE STATEMENT:  Patient presents with wife. No new changes to report.     Pt accompanied by:  wife- [name]   PERTINENT HISTORY: Patient is a 89 year old male with referall from PCP for imbalance and past medical history includes a recent hospitalization- 12/16/2022- 5 days for sepsis and PNA. PMH includes renal lesion, A-Fib, COPD, Depression, HTN, GOUT, Sleep apnea,  and HLD.  Wife reports he has been having balance issues for 2-3 years but having more unsteadiness recently  PAIN:  Are you having pain? No  PRECAUTIONS: Fall  RED  FLAGS: None   WEIGHT BEARING RESTRICTIONS: No  FALLS: Has patient fallen in last 6 months?  No falls but several near falls.   LIVING ENVIRONMENT: Lives with: lives with their family- wife (son stays during the day)  Lives in: House/apartment Stairs: Yes: External: 3 steps; on right going up Has following equipment at home: Single point cane, Rollator  PLOF: Requires assistive device for independence and Needs assistance with ADLs  PATIENT GOALS: To improve my balance and not fall  OBJECTIVE:  Note: Objective measures were completed at Evaluation unless otherwise noted.  DIAGNOSTIC FINDINGS:  CT SCAN- 12/16/2022 IMPRESSION: 1. No evidence for aortic dissection or aneurysm. 2. Ascending thoracic aortic aneurysm measuring 4.3 x 3.7 cm. Recommend annual imaging followup by CTA or MRA. This recommendation follows 2010 ACCF/AHA/AATS/ACR/ASA/SCA/SCAI/SIR/STS/SVM Guidelines for the Diagnosis and Management of Patients with Thoracic Aortic Disease. Circulation. 2010; 121: Z733-z630. Aortic aneurysm NOS (ICD10-I71.9) 3. Pathologic mediastinal and right hilar lymphadenopathy of uncertain etiology. 4. Findings compatible with interstitial lung disease. 5. Ground-glass opacity in the right lower lobe measuring 3.3 cm. This may be infectious/inflammatory, but neoplasm can not be excluded. 6. 4.8 cm indeterminate right renal lesion, possibly complex cysts. Recommend further evaluation with dedicated CT or MRI. 7. Bladder diverticulum. 8. Colonic diverticulosis. 9. Prostatomegaly. 10. Small fat containing left inguinal hernia.     Electronically Signed   By: Greig Pique M.D.   On: 12/16/2022 23:30  COGNITION: Overall cognitive status: Impaired- forgetful   SENSATION: WFL  COORDINATION: Delayed at times with BLE  EDEMA:  None observed   POSTURE: rounded shoulders and forward head    LOWER EXTREMITY MMT:    MMT Right Eval Left Eval  Hip flexion 4 4  Hip extension     Hip abduction 4 4  Hip adduction 4 4  Hip internal rotation 4 4  Hip external rotation 4 4  Knee flexion 4 4  Knee extension 4 4  Ankle dorsiflexion 4 4  Ankle plantarflexion    Ankle inversion    Ankle eversion    (Blank rows = not tested)   TRANSFERS: Assistive device utilized: Single point cane  Sit to stand: Modified independence Stand to sit: Modified independence Chair to chair: Modified independence Floor:  Not tested   GAIT: Gait pattern: decreased arm swing- Right, decreased arm swing- Left, decreased step length- Right, decreased step length- Left, decreased stance time- Right, decreased stance time- Left, and decreased stride length Distance walked: > 200 feet in total Assistive device utilized: Single point cane Level of assistance: CGA Comments: decreased gait speed and VC to pick up feet  FUNCTIONAL TESTS:  5 times sit to stand: 22.68 sec withuout  Timed up and go (TUG): 13.99 sec with cane 6 minute walk test: To be performed next visit 10 meter walk test: 0.59 m/s Berg Balance Scale: 45/56  PATIENT SURVEYS:  FOTO 54  TODAY'S TREATMENT:  DATE: 05/13/23  TA- To improve functional movements patterns for everyday tasks alternating with  TE- To improve strength, endurance, mobility, and function of specific targeted muscle groups or improve joint range of motion or improve muscle flexibility  -STS 2x10 TE -Scap retraction with GTB 2 x 10 rep TA -Ambulation x 320 ft with CGA   TE -GTB hamstring curl 2*15 each LE TA  - Ambulation with 3# AW x 320 ft    NMR: To facilitate reeducation of movement, balance, posture, coordination, and/or proprioception/kinesthetic sense.  Standing with CGA next to support surface:   Airex pad: horizontal head turns x 10  scanning room 10x ; cueing for arc of motion  Airex pad: vertical head  turns x 10 , cueing for arc of motion, noticeable sway with upward gaze increasing demand on ankle righting reaction musculature NBOS on airex pad x 1 min   TE:  LAQ 2 x 10 x 3 sec hold   PATIENT EDUCATION: Education details: PT plan of care; goals Person educated: Patient Education method: Explanation Education comprehension: verbalized understanding  HOME EXERCISE PROGRAM:  Access Code: [email or web address] URL: [email or web address] Date: 03/20/2023 Prepared by: Reyes London  Exercises - Scapular Retraction with Resistance  - 1 x daily - 3 sets - 10 reps - Shoulder extension with resistance - Neutral  - 1 x daily - 7 x weekly - 3 sets - 10 reps   Access Code: [email or web address] URL: [email or web address] Date: 02/04/2023 Prepared by: Marina  Moser  Exercises - Seated March  - 1 x daily - 7 x weekly - 2 sets - 10 reps - 5 hold - Leg Extension  - 1 x daily - 7 x weekly - 2 sets - 10 reps - 3 hold - Seated Hip Abduction with Resistance  - 1 x daily - 7 x weekly - 2 sets - 10 reps - 5 hold  GOALS: Goals reviewed with patient? Yes  SHORT TERM GOALS: Target date: 03/13/2023  Pt will be independent with HEP in order to improve strength and balance in order to decrease fall risk and improve function at home and work.  Baseline: EVAL: No formal HEP in place; 05/06/2023= Patient reports knowledgeable of HEP but states has not been very compliant.  Goal status: Partially Met    LONG TERM GOALS: Target date: 07/29/23  1.  Patient (> 70 years old) will complete five times sit to stand test in < 15 seconds indicating an increased LE strength and improved balance. Baseline: EVAL: 22.68 without UE support; 05/06/2023= 16.79 sec without UE support Goal status: PROGRESSING  2.  Patient will increase FOTO score to equal to or greater than  55   to demonstrate statistically significant improvement in mobility and quality of life.  Baseline: EVAL=54; 05/06/2023=62 Goal  status: MET   3.  Patient will increase Berg Balance score by > 4 points to demonstrate decreased fall risk during functional activities. Baseline: 45; 05/06/2023= 47/56 Goal status: PROGRESSING   4.   Patient will reduce timed up and go to <11 seconds to reduce fall risk and demonstrate improved transfer/gait ability. Baseline: EVAL=13.99 sec with cane; 05/06/2023= 13.01 sec without a cane.  Goal status: PROGRESSING  5.   Patient will increase 10 meter walk test to >1.31m/s as to improve gait speed for better community ambulation and to reduce fall risk. Baseline: EVAL= 0.58 m/s; 05/06/2023= 0.75 m/s without an AD Goal status: PROGRESSING  6.   Patient will increase six minute walk test distance to >1000  for progression to community ambulator and improve gait ability Baseline: EVAL: to be assessed 2nd visit 10/29: 715 ft; 05/06/2023= 750 feet in 3 min 54 sec without AD Goal status: PROGRESSING    ASSESSMENT:  CLINICAL IMPRESSION: Patient tolerates progressive strengthening and mobility throughout session. Pt ambulating further compared to last date with good balance while ambulating. Pt also more verbal this date compared to last visit with this PT.   He is eager to become more steady and not have as much assistance.  Pt will continue to benefit from skilled physical therapy intervention to address impairments, improve QOL, and attain therapy goals      OBJECTIVE IMPAIRMENTS: Abnormal gait, decreased activity tolerance, decreased balance, decreased cognition, decreased coordination, decreased endurance, decreased mobility, difficulty walking, decreased strength, and impaired flexibility.   ACTIVITY LIMITATIONS: carrying, lifting, bending, sitting, standing, squatting, stairs, and transfers  PARTICIPATION LIMITATIONS: cleaning, laundry, shopping, community activity, and yard work  PERSONAL FACTORS: Age and 1-2 comorbidities: COPD, HTN  are also affecting patient's functional outcome.    REHAB POTENTIAL: Good  CLINICAL DECISION MAKING: Evolving/moderate complexity  EVALUATION COMPLEXITY: Moderate  PLAN:  PT FREQUENCY: 1-2x/week  PT DURATION: 12 weeks  PLANNED INTERVENTIONS: 97164- PT Re-evaluation, 97110-Therapeutic exercises, 97530- Therapeutic activity, [id number]- Neuromuscular re-education, 97535- Self Care, 02859- Manual therapy, [id number]- Gait training, [phone]- Orthotic Fit/training, [phone]- Canalith repositioning, [phone]- Electrical stimulation (manual), Patient/Family education, Balance training, Stair training, Taping, Dry Needling, Joint mobilization, Spinal mobilization, Vestibular training, Visual/preceptual remediation/compensation, DME instructions, Cryotherapy, and Moist heat  PLAN FOR NEXT SESSION:  Continue with balance training, and LE strengthening/conditioning to improve his overall functional endurance.    Lonni KATHEE Gainer, PT 05/13/2023, 11:42 AM

## 2023-05-14 ENCOUNTER — Ambulatory Visit: Payer: No Typology Code available for payment source | Admitting: Physical Therapy

## 2023-05-14 NOTE — Therapy (Deleted)
 OUTPATIENT PHYSICAL THERAPY NEURO TREATMENT  Patient Name: Craig Gilmore MRN: [id number] DOB:1934/12/30, 89 y.o., male Today's Date: 05/14/2023   PCP: Dr. Lynwood Null   REFERRING PROVIDER: Dr. Lynwood Null  END OF SESSION:               Past Medical History:  Diagnosis Date   Chronic atrial fibrillation [facility])    a.) CHA2DS2VASc = 3 (age x 2, HTN);  b.) s/p DCCV 08/20/2004 --> 50 J x 1 and 100 J x 1 --> coverted with SB. (+) post-procedure nausea; c.) rate/rhythm maintained without pharmacological intervention; chronically anticoagulated with warfarin   Colonic polyp    COPD (chronic obstructive pulmonary disease) (HCC)    Depression    Dyspnea    Edema    GERD (gastroesophageal reflux disease)    Gout    Hemorrhoids    Hyperlipidemia    Hypertension    Insomnia    a.) on hypnotic (zolpidem )   Long term current use of anticoagulant    a.) warfarin   OSA on CPAP    Pneumonia    Pulmonary HTN (HCC) 02/14/2016   a.) TTE 02/14/2016: RVSP 37.5 mmHg   Sick sinus syndrome (HCC)    Squamous cell carcinoma in situ 10/03/2009   L medial infrapectoral   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   L nose supratip   Stage 3b chronic kidney disease (CKD) (HCC)    Past Surgical History:  Procedure Laterality Date   CARDIOVERSION N/A 08/21/2004   Procedure: CIRECT CURRENT CARDIOVERSION; Location: [facility]; Surgeon: Vinie Jude, MD   CATARACT EXTRACTION, BILATERAL     CHOLECYSTECTOMY     COLONOSCOPY     IR EMBO TUMOR ORGAN ISCHEMIA INFARCT INC GUIDE ROADMAPPING  03/12/2023   IR RADIOLOGIST EVAL & MGMT  02/25/2023   IR RADIOLOGIST EVAL & MGMT  03/27/2023   NASAL SINUS SURGERY     Patient Active Problem List   Diagnosis Date Noted   Hyponatremia 12/19/2022   Sick sinus syndrome (HCC) 12/16/2022   Interstitial lung disease (HCC) 12/16/2022   Thoracic aortic aneurysm (HCC) 12/16/2022   Sepsis due to pneumonia (HCC) 12/16/2022   Idiopathic chronic gout of foot  without tophus 02/07/2022   Stage 3b chronic kidney disease (HCC) 02/07/2022   Hyperlipidemia 05/04/2015   Encounter for therapeutic drug monitoring 11/24/2013   Exertional dyspnea 11/23/2013   Chronic atrial fibrillation (HCC)    Pneumonia, organism unspecified(486) 08/17/2012    ONSET DATE: 2-3  REFERRING DIAG:  R26.81 (ICD-10-CM) - Unsteadiness on feet  R68.89 (ICD-10-CM) - Other general symptoms and signs  Z87.01 (ICD-10-CM) - Personal history of pneumonia (recurrent)    THERAPY DIAG:  No diagnosis found.  Rationale for Evaluation and Treatment: Rehabilitation  SUBJECTIVE:  SUBJECTIVE STATEMENT:  Patient presents with wife. No new changes to report.     Pt accompanied by:  wife- [name]   PERTINENT HISTORY: Patient is a 89 year old male with referall from PCP for imbalance and past medical history includes a recent hospitalization- 12/16/2022- 5 days for sepsis and PNA. PMH includes renal lesion, A-Fib, COPD, Depression, HTN, GOUT, Sleep apnea,  and HLD.  Wife reports he has been having balance issues for 2-3 years but having more unsteadiness recently  PAIN:  Are you having pain? No  PRECAUTIONS: Fall  RED FLAGS: None   WEIGHT BEARING RESTRICTIONS: No  FALLS: Has patient fallen in last 6 months?  No falls but several near falls.   LIVING ENVIRONMENT: Lives with: lives with their family- wife (son stays during the day)  Lives in: House/apartment Stairs: Yes: External: 3 steps; on right going up Has following equipment at home: Single point cane, Rollator  PLOF: Requires assistive device for independence and Needs assistance with ADLs  PATIENT GOALS: To improve my balance and not fall  OBJECTIVE:  Note: Objective measures were completed at Evaluation unless otherwise  noted.  DIAGNOSTIC FINDINGS:  CT SCAN- 12/16/2022 IMPRESSION: 1. No evidence for aortic dissection or aneurysm. 2. Ascending thoracic aortic aneurysm measuring 4.3 x 3.7 cm. Recommend annual imaging followup by CTA or MRA. This recommendation follows 2010 ACCF/AHA/AATS/ACR/ASA/SCA/SCAI/SIR/STS/SVM Guidelines for the Diagnosis and Management of Patients with Thoracic Aortic Disease. Circulation. 2010; 121: Z733-z630. Aortic aneurysm NOS (ICD10-I71.9) 3. Pathologic mediastinal and right hilar lymphadenopathy of uncertain etiology. 4. Findings compatible with interstitial lung disease. 5. Ground-glass opacity in the right lower lobe measuring 3.3 cm. This may be infectious/inflammatory, but neoplasm can not be excluded. 6. 4.8 cm indeterminate right renal lesion, possibly complex cysts. Recommend further evaluation with dedicated CT or MRI. 7. Bladder diverticulum. 8. Colonic diverticulosis. 9. Prostatomegaly. 10. Small fat containing left inguinal hernia.     Electronically Signed   By: Greig Pique M.D.   On: 12/16/2022 23:30  COGNITION: Overall cognitive status: Impaired- forgetful   SENSATION: WFL  COORDINATION: Delayed at times with BLE  EDEMA:  None observed   POSTURE: rounded shoulders and forward head    LOWER EXTREMITY MMT:    MMT Right Eval Left Eval  Hip flexion 4 4  Hip extension    Hip abduction 4 4  Hip adduction 4 4  Hip internal rotation 4 4  Hip external rotation 4 4  Knee flexion 4 4  Knee extension 4 4  Ankle dorsiflexion 4 4  Ankle plantarflexion    Ankle inversion    Ankle eversion    (Blank rows = not tested)   TRANSFERS: Assistive device utilized: Single point cane  Sit to stand: Modified independence Stand to sit: Modified independence Chair to chair: Modified independence Floor:  Not tested   GAIT: Gait pattern: decreased arm swing- Right, decreased arm swing- Left, decreased step length- Right, decreased step length-  Left, decreased stance time- Right, decreased stance time- Left, and decreased stride length Distance walked: > 200 feet in total Assistive device utilized: Single point cane Level of assistance: CGA Comments: decreased gait speed and VC to pick up feet  FUNCTIONAL TESTS:  5 times sit to stand: 22.68 sec withuout  Timed up and go (TUG): 13.99 sec with cane 6 minute walk test: To be performed next visit 10 meter walk test: 0.59 m/s Berg Balance Scale: 45/56  PATIENT SURVEYS:  FOTO 54  TODAY'S TREATMENT:  DATE: 05/14/23  TA- To improve functional movements patterns for everyday tasks alternating with  TE- To improve strength, endurance, mobility, and function of specific targeted muscle groups or improve joint range of motion or improve muscle flexibility  -STS 2x10 TE -Scap retraction with GTB 2 x 10 rep TA -Ambulation x 320 ft with CGA   TE -GTB hamstring curl 2*15 each LE TA  - Ambulation with 3# AW x 320 ft    NMR: To facilitate reeducation of movement, balance, posture, coordination, and/or proprioception/kinesthetic sense.  Standing with CGA next to support surface:   Airex pad: horizontal head turns x 10  scanning room 10x ; cueing for arc of motion  Airex pad: vertical head turns x 10 , cueing for arc of motion, noticeable sway with upward gaze increasing demand on ankle righting reaction musculature NBOS on airex pad x 1 min   TE:  LAQ 2 x 10 x 3 sec hold   PATIENT EDUCATION: Education details: PT plan of care; goals Person educated: Patient Education method: Explanation Education comprehension: verbalized understanding  HOME EXERCISE PROGRAM:  Access Code: [email or web address] URL: [email or web address] Date: 03/20/2023 Prepared by: Reyes London  Exercises - Scapular Retraction with Resistance  - 1 x daily - 3 sets - 10  reps - Shoulder extension with resistance - Neutral  - 1 x daily - 7 x weekly - 3 sets - 10 reps   Access Code: [email or web address] URL: [email or web address] Date: 02/04/2023 Prepared by: Marina  Moser  Exercises - Seated March  - 1 x daily - 7 x weekly - 2 sets - 10 reps - 5 hold - Leg Extension  - 1 x daily - 7 x weekly - 2 sets - 10 reps - 3 hold - Seated Hip Abduction with Resistance  - 1 x daily - 7 x weekly - 2 sets - 10 reps - 5 hold  GOALS: Goals reviewed with patient? Yes  SHORT TERM GOALS: Target date: 03/13/2023  Pt will be independent with HEP in order to improve strength and balance in order to decrease fall risk and improve function at home and work.  Baseline: EVAL: No formal HEP in place; 05/06/2023= Patient reports knowledgeable of HEP but states has not been very compliant.  Goal status: Partially Met    LONG TERM GOALS: Target date: 07/29/23  1.  Patient (> 72 years old) will complete five times sit to stand test in < 15 seconds indicating an increased LE strength and improved balance. Baseline: EVAL: 22.68 without UE support; 05/06/2023= 16.79 sec without UE support Goal status: PROGRESSING  2.  Patient will increase FOTO score to equal to or greater than  55   to demonstrate statistically significant improvement in mobility and quality of life.  Baseline: EVAL=54; 05/06/2023=62 Goal status: MET   3.  Patient will increase Berg Balance score by > 4 points to demonstrate decreased fall risk during functional activities. Baseline: 45; 05/06/2023= 47/56 Goal status: PROGRESSING   4.   Patient will reduce timed up and go to <11 seconds to reduce fall risk and demonstrate improved transfer/gait ability. Baseline: EVAL=13.99 sec with cane; 05/06/2023= 13.01 sec without a cane.  Goal status: PROGRESSING  5.   Patient will increase 10 meter walk test to >1.32m/s as to improve gait speed for better community ambulation and to reduce fall risk. Baseline: EVAL= 0.58  m/s; 05/06/2023= 0.75 m/s without an AD Goal status: PROGRESSING  6.   Patient will increase six minute walk test distance to >1000  for progression to community ambulator and improve gait ability Baseline: EVAL: to be assessed 2nd visit 10/29: 715 ft; 05/06/2023= 750 feet in 3 min 54 sec without AD Goal status: PROGRESSING    ASSESSMENT:  CLINICAL IMPRESSION: Patient tolerates progressive strengthening and mobility throughout session. Pt ambulating further compared to last date with good balance while ambulating. Pt also more verbal this date compared to last visit with this PT.   He is eager to become more steady and not have as much assistance.  Pt will continue to benefit from skilled physical therapy intervention to address impairments, improve QOL, and attain therapy goals      OBJECTIVE IMPAIRMENTS: Abnormal gait, decreased activity tolerance, decreased balance, decreased cognition, decreased coordination, decreased endurance, decreased mobility, difficulty walking, decreased strength, and impaired flexibility.   ACTIVITY LIMITATIONS: carrying, lifting, bending, sitting, standing, squatting, stairs, and transfers  PARTICIPATION LIMITATIONS: cleaning, laundry, shopping, community activity, and yard work  PERSONAL FACTORS: Age and 1-2 comorbidities: COPD, HTN  are also affecting patient's functional outcome.   REHAB POTENTIAL: Good  CLINICAL DECISION MAKING: Evolving/moderate complexity  EVALUATION COMPLEXITY: Moderate  PLAN:  PT FREQUENCY: 1-2x/week  PT DURATION: 12 weeks  PLANNED INTERVENTIONS: 97164- PT Re-evaluation, 97110-Therapeutic exercises, 97530- Therapeutic activity, [id number]- Neuromuscular re-education, 97535- Self Care, 02859- Manual therapy, [id number]- Gait training, [phone]- Orthotic Fit/training, [phone]- Canalith repositioning, [phone]- Electrical stimulation (manual), Patient/Family education, Balance training, Stair training, Taping, Dry Needling, Joint mobilization,  Spinal mobilization, Vestibular training, Visual/preceptual remediation/compensation, DME instructions, Cryotherapy, and Moist heat  PLAN FOR NEXT SESSION:  Continue with balance training, and LE strengthening/conditioning to improve his overall functional endurance.    Lonni KATHEE Gainer, PT 05/14/2023, 4:43 PM

## 2023-05-15 ENCOUNTER — Ambulatory Visit: Payer: No Typology Code available for payment source

## 2023-05-15 ENCOUNTER — Ambulatory Visit: Payer: No Typology Code available for payment source | Admitting: Physical Therapy

## 2023-05-15 DIAGNOSIS — R262 Difficulty in walking, not elsewhere classified: Secondary | ICD-10-CM

## 2023-05-15 DIAGNOSIS — M6281 Muscle weakness (generalized): Secondary | ICD-10-CM

## 2023-05-15 DIAGNOSIS — R269 Unspecified abnormalities of gait and mobility: Secondary | ICD-10-CM | POA: Diagnosis not present

## 2023-05-15 DIAGNOSIS — R2689 Other abnormalities of gait and mobility: Secondary | ICD-10-CM

## 2023-05-15 DIAGNOSIS — R2681 Unsteadiness on feet: Secondary | ICD-10-CM

## 2023-05-15 NOTE — Therapy (Signed)
 OUTPATIENT PHYSICAL THERAPY NEURO TREATMENT  Patient Name: Craig Gilmore MRN: [id number] DOB:Dec 04, 1934, 89 y.o., male Today's Date: 05/15/2023   PCP: Dr. Lynwood Null   REFERRING PROVIDER: Dr. Lynwood Null  END OF SESSION:  PT End of Session - 05/15/23 1142     Visit Number 14    Number of Visits 24    Date for PT Re-Evaluation 07/29/23    Progress Note Due on Visit 20    PT Start Time 1144    PT Stop Time 1229    PT Time Calculation (min) 45 min    Equipment Utilized During Treatment Gait belt    Activity Tolerance Patient tolerated treatment well    Behavior During Therapy WFL for tasks assessed/performed                         Past Medical History:  Diagnosis Date   Chronic atrial fibrillation (HCC)    a.) CHA2DS2VASc = 3 (age x 2, HTN);  b.) s/p DCCV 08/20/2004 --> 50 J x 1 and 100 J x 1 --> coverted with SB. (+) post-procedure nausea; c.) rate/rhythm maintained without pharmacological intervention; chronically anticoagulated with warfarin   Colonic polyp    COPD (chronic obstructive pulmonary disease) (HCC)    Depression    Dyspnea    Edema    GERD (gastroesophageal reflux disease)    Gout    Hemorrhoids    Hyperlipidemia    Hypertension    Insomnia    a.) on hypnotic (zolpidem )   Long term current use of anticoagulant    a.) warfarin   OSA on CPAP    Pneumonia    Pulmonary HTN (HCC) 02/14/2016   a.) TTE 02/14/2016: RVSP 37.5 mmHg   Sick sinus syndrome (HCC)    Squamous cell carcinoma in situ 10/03/2009   L medial infrapectoral   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   L nose supratip   Stage 3b chronic kidney disease (CKD) (HCC)    Past Surgical History:  Procedure Laterality Date   CARDIOVERSION N/A 08/21/2004   Procedure: CIRECT CURRENT CARDIOVERSION; Location: [facility]; Surgeon: Vinie Jude, MD   CATARACT EXTRACTION, BILATERAL     CHOLECYSTECTOMY     COLONOSCOPY     IR EMBO TUMOR ORGAN ISCHEMIA INFARCT INC GUIDE ROADMAPPING   03/12/2023   IR RADIOLOGIST EVAL & MGMT  02/25/2023   IR RADIOLOGIST EVAL & MGMT  03/27/2023   NASAL SINUS SURGERY     Patient Active Problem List   Diagnosis Date Noted   Hyponatremia 12/19/2022   Sick sinus syndrome (HCC) 12/16/2022   Interstitial lung disease (HCC) 12/16/2022   Thoracic aortic aneurysm (HCC) 12/16/2022   Sepsis due to pneumonia (HCC) 12/16/2022   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 tophus 02/07/2022   Stage 3b chronic kidney disease (HCC) 02/07/2022   Hyperlipidemia 05/04/2015   Encounter for therapeutic drug monitoring 11/24/2013   Exertional dyspnea 11/23/2013   Chronic atrial fibrillation (HCC)    Pneumonia, organism unspecified(486) 08/17/2012    ONSET DATE: 2-3  REFERRING DIAG:  R26.81 (ICD-10-CM) - Unsteadiness on feet  R68.89 (ICD-10-CM) - Other general symptoms and signs  Z87.01 (ICD-10-CM) - Personal history of pneumonia (recurrent)    THERAPY DIAG:  Abnormality of gait and mobility  Unsteadiness on feet  Muscle weakness (generalized)  Difficulty in walking, not elsewhere classified  Other abnormalities of gait and mobility  Rationale for Evaluation and Treatment: Rehabilitation  SUBJECTIVE:  SUBJECTIVE STATEMENT: Patient reports feeling blah today.   Pt accompanied by:  wife- [name]   PERTINENT HISTORY: Patient is a 89 year old male with referall from PCP for imbalance and past medical history includes a recent hospitalization- 12/16/2022- 5 days for sepsis and PNA. PMH includes renal lesion, A-Fib, COPD, Depression, HTN, GOUT, Sleep apnea,  and HLD.  Wife reports he has been having balance issues for 2-3 years but having more unsteadiness recently  PAIN:  Are you having pain? No  PRECAUTIONS: Fall  RED FLAGS: None   WEIGHT BEARING  RESTRICTIONS: No  FALLS: Has patient fallen in last 6 months?  No falls but several near falls.   LIVING ENVIRONMENT: Lives with: lives with their family- wife (son stays during the day)  Lives in: House/apartment Stairs: Yes: External: 3 steps; on right going up Has following equipment at home: Single point cane, Rollator  PLOF: Requires assistive device for independence and Needs assistance with ADLs  PATIENT GOALS: To improve my balance and not fall  OBJECTIVE:  Note: Objective measures were completed at Evaluation unless otherwise noted.  DIAGNOSTIC FINDINGS:  CT SCAN- 12/16/2022 IMPRESSION: 1. No evidence for aortic dissection or aneurysm. 2. Ascending thoracic aortic aneurysm measuring 4.3 x 3.7 cm. Recommend annual imaging followup by CTA or MRA. This recommendation follows 2010 ACCF/AHA/AATS/ACR/ASA/SCA/SCAI/SIR/STS/SVM Guidelines for the Diagnosis and Management of Patients with Thoracic Aortic Disease. Circulation. 2010; 121: Z733-z630. Aortic aneurysm NOS (ICD10-I71.9) 3. Pathologic mediastinal and right hilar lymphadenopathy of uncertain etiology. 4. Findings compatible with interstitial lung disease. 5. Ground-glass opacity in the right lower lobe measuring 3.3 cm. This may be infectious/inflammatory, but neoplasm can not be excluded. 6. 4.8 cm indeterminate right renal lesion, possibly complex cysts. Recommend further evaluation with dedicated CT or MRI. 7. Bladder diverticulum. 8. Colonic diverticulosis. 9. Prostatomegaly. 10. Small fat containing left inguinal hernia.     Electronically Signed   By: Greig Pique M.D.   On: 12/16/2022 23:30  COGNITION: Overall cognitive status: Impaired- forgetful   SENSATION: WFL  COORDINATION: Delayed at times with BLE  EDEMA:  None observed   POSTURE: rounded shoulders and forward head    LOWER EXTREMITY MMT:    MMT Right Eval Left Eval  Hip flexion 4 4  Hip extension    Hip abduction 4 4  Hip  adduction 4 4  Hip internal rotation 4 4  Hip external rotation 4 4  Knee flexion 4 4  Knee extension 4 4  Ankle dorsiflexion 4 4  Ankle plantarflexion    Ankle inversion    Ankle eversion    (Blank rows = not tested)   TRANSFERS: Assistive device utilized: Single point cane  Sit to stand: Modified independence Stand to sit: Modified independence Chair to chair: Modified independence Floor:  Not tested   GAIT: Gait pattern: decreased arm swing- Right, decreased arm swing- Left, decreased step length- Right, decreased step length- Left, decreased stance time- Right, decreased stance time- Left, and decreased stride length Distance walked: > 200 feet in total Assistive device utilized: Single point cane Level of assistance: CGA Comments: decreased gait speed and VC to pick up feet  FUNCTIONAL TESTS:  5 times sit to stand: 22.68 sec withuout  Timed up and go (TUG): 13.99 sec with cane 6 minute walk test: To be performed next visit 10 meter walk test: 0.59 m/s Berg Balance Scale: 45/56  PATIENT SURVEYS:  FOTO 54  TODAY'S TREATMENT:  DATE: 05/15/23 BP : 139/92  TA- To improve functional movements patterns for everyday tasks alternating with   -10 x STS  -ambulate 150 ft with cues for heel strike and large steps with dual task of negotiating busy gym.  -airex pad lateral step onto 6 step then onto other side sandwhiched 6 step.  -sit to stand throw ball to PT x 10  TE- To improve strength, endurance, mobility, and function of specific targeted muscle groups or improve joint range of motion or improve muscle flexibility  GTB hamstring curl 15x each LE LAQ with adduction ball squeeze 15x   NMR: To facilitate reeducation of movement, balance, posture, coordination, and/or proprioception/kinesthetic sense.  Standing with CGA next to support  surface:   Airex pad:6 steptandem stance 30 seconds x 2 trials     PATIENT EDUCATION: Education details: PT plan of care; goals Person educated: Patient Education method: Explanation Education comprehension: verbalized understanding  HOME EXERCISE PROGRAM:  Access Code: [email or web address] URL: [email or web address] Date: 03/20/2023 Prepared by: Reyes London  Exercises - Scapular Retraction with Resistance  - 1 x daily - 3 sets - 10 reps - Shoulder extension with resistance - Neutral  - 1 x daily - 7 x weekly - 3 sets - 10 reps   Access Code: [email or web address] URL: [email or web address] Date: 02/04/2023 Prepared by: Jeris Easterly  Exercises - Seated March  - 1 x daily - 7 x weekly - 2 sets - 10 reps - 5 hold - Leg Extension  - 1 x daily - 7 x weekly - 2 sets - 10 reps - 3 hold - Seated Hip Abduction with Resistance  - 1 x daily - 7 x weekly - 2 sets - 10 reps - 5 hold  GOALS: Goals reviewed with patient? Yes  SHORT TERM GOALS: Target date: 03/13/2023  Pt will be independent with HEP in order to improve strength and balance in order to decrease fall risk and improve function at home and work.  Baseline: EVAL: No formal HEP in place; 05/06/2023= Patient reports knowledgeable of HEP but states has not been very compliant.  Goal status: Partially Met    LONG TERM GOALS: Target date: 07/29/23  1.  Patient (> 30 years old) will complete five times sit to stand test in < 15 seconds indicating an increased LE strength and improved balance. Baseline: EVAL: 22.68 without UE support; 05/06/2023= 16.79 sec without UE support Goal status: PROGRESSING  2.  Patient will increase FOTO score to equal to or greater than  55   to demonstrate statistically significant improvement in mobility and quality of life.  Baseline: EVAL=54; 05/06/2023=62 Goal status: MET   3.  Patient will increase Berg Balance score by > 4 points to demonstrate decreased fall risk during  functional activities. Baseline: 45; 05/06/2023= 47/56 Goal status: PROGRESSING   4.   Patient will reduce timed up and go to <11 seconds to reduce fall risk and demonstrate improved transfer/gait ability. Baseline: EVAL=13.99 sec with cane; 05/06/2023= 13.01 sec without a cane.  Goal status: PROGRESSING  5.   Patient will increase 10 meter walk test to >1.60m/s as to improve gait speed for better community ambulation and to reduce fall risk. Baseline: EVAL= 0.58 m/s; 05/06/2023= 0.75 m/s without an AD Goal status: PROGRESSING  6.   Patient will increase six minute walk test distance to >1000 for progression to community ambulator and improve gait ability Baseline: EVAL: to be assessed 2nd visit 10/29: 715 ft; 05/06/2023= 750 feet in 3 min  54 sec without AD Goal status: PROGRESSING    ASSESSMENT:  CLINICAL IMPRESSION: Patient presented at different time, was able to be seen. Patient is initially sleepy upon arrival but does improve as session progresses. Vitals monitored throughout session to ensure within therapeutic range due to prepense of headache.  Pt will continue to benefit from skilled physical therapy intervention to address impairments, improve QOL, and attain therapy goals      OBJECTIVE IMPAIRMENTS: Abnormal gait, decreased activity tolerance, decreased balance, decreased cognition, decreased coordination, decreased endurance, decreased mobility, difficulty walking, decreased strength, and impaired flexibility.   ACTIVITY LIMITATIONS: carrying, lifting, bending, sitting, standing, squatting, stairs, and transfers  PARTICIPATION LIMITATIONS: cleaning, laundry, shopping, community activity, and yard work  PERSONAL FACTORS: Age and 1-2 comorbidities: COPD, HTN  are also affecting patient's functional outcome.   REHAB POTENTIAL: Good  CLINICAL DECISION MAKING: Evolving/moderate complexity  EVALUATION COMPLEXITY: Moderate  PLAN:  PT FREQUENCY: 1-2x/week  PT DURATION: 12  weeks  PLANNED INTERVENTIONS: 97164- PT Re-evaluation, 97110-Therapeutic exercises, 97530- Therapeutic activity, [id number]- Neuromuscular re-education, 97535- Self Care, [id number]- Manual therapy, [id number]- Gait training, [phone]- Orthotic Fit/training, [phone]- Canalith repositioning, [phone]- Electrical stimulation (manual), Patient/Family education, Balance training, Stair training, Taping, Dry Needling, Joint mobilization, Spinal mobilization, Vestibular training, Visual/preceptual remediation/compensation, DME instructions, Cryotherapy, and Moist heat  PLAN FOR NEXT SESSION:  Continue with balance training, and LE strengthening/conditioning to improve his overall functional endurance.    Keiarra Charon, PT 05/15/2023, 1:16 PM

## 2023-05-19 ENCOUNTER — Ambulatory Visit: Payer: No Typology Code available for payment source

## 2023-05-19 ENCOUNTER — Other Ambulatory Visit: Payer: Self-pay | Admitting: Interventional Radiology

## 2023-05-19 DIAGNOSIS — N400 Enlarged prostate without lower urinary tract symptoms: Secondary | ICD-10-CM

## 2023-05-20 ENCOUNTER — Ambulatory Visit: Payer: No Typology Code available for payment source

## 2023-05-20 DIAGNOSIS — R2689 Other abnormalities of gait and mobility: Secondary | ICD-10-CM

## 2023-05-20 DIAGNOSIS — R269 Unspecified abnormalities of gait and mobility: Secondary | ICD-10-CM | POA: Diagnosis not present

## 2023-05-20 DIAGNOSIS — R278 Other lack of coordination: Secondary | ICD-10-CM

## 2023-05-20 DIAGNOSIS — R262 Difficulty in walking, not elsewhere classified: Secondary | ICD-10-CM

## 2023-05-20 DIAGNOSIS — R2681 Unsteadiness on feet: Secondary | ICD-10-CM

## 2023-05-20 DIAGNOSIS — M6281 Muscle weakness (generalized): Secondary | ICD-10-CM

## 2023-05-20 NOTE — Therapy (Signed)
OUTPATIENT PHYSICAL THERAPY NEURO TREATMENT  Patient Name: Craig Gilmore MRN: [id number] DOB:Feb 19, 1935, 89 y.o., male Today's Date: 05/20/2023   PCP: Dr. Jerl Mina   REFERRING PROVIDER: Dr. Jerl Mina  END OF SESSION:  PT End of Session - 05/20/23 0931     Visit Number 15    Number of Visits 24    Date for PT Re-Evaluation 07/29/23    Progress Note Due on Visit 20    PT Start Time 0931    PT Stop Time 1013    PT Time Calculation (min) 42 min    Equipment Utilized During Treatment Gait belt    Activity Tolerance Patient tolerated treatment well    Behavior During Therapy WFL for tasks assessed/performed                          Past Medical History:  Diagnosis Date   Chronic atrial fibrillation (HCC)    a.) CHA2DS2VASc = 3 (age x 2, HTN);  b.) s/p DCCV 08/20/2004 --> 50 J x 1 and 100 J x 1 --> coverted with SB. (+) post-procedure nausea; c.) rate/rhythm maintained without pharmacological intervention; chronically anticoagulated with warfarin   Colonic polyp    COPD (chronic obstructive pulmonary disease) (HCC)    Depression    Dyspnea    Edema    GERD (gastroesophageal reflux disease)    Gout    Hemorrhoids    Hyperlipidemia    Hypertension    Insomnia    a.) on hypnotic (zolpidem)   Long term current use of anticoagulant    a.) warfarin   OSA on CPAP    Pneumonia    Pulmonary HTN (HCC) 02/14/2016   a.) TTE 02/14/2016: RVSP 37.5 mmHg   Sick sinus syndrome (HCC)    Squamous cell carcinoma in situ 10/03/2009   L medial infrapectoral   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   L nose supratip   Stage 3b chronic kidney disease (CKD) (HCC)    Past Surgical History:  Procedure Laterality Date   CARDIOVERSION N/A 08/21/2004   Procedure: CIRECT CURRENT CARDIOVERSION; Location: [facility]; Surgeon: Harold Hedge, MD   CATARACT EXTRACTION, BILATERAL     CHOLECYSTECTOMY     COLONOSCOPY     IR EMBO TUMOR ORGAN ISCHEMIA INFARCT INC GUIDE  ROADMAPPING  03/12/2023   IR RADIOLOGIST EVAL & MGMT  02/25/2023   IR RADIOLOGIST EVAL & MGMT  03/27/2023   NASAL SINUS SURGERY     Patient Active Problem List   Diagnosis Date Noted   Hyponatremia 12/19/2022   Sick sinus syndrome (HCC) 12/16/2022   Interstitial lung disease (HCC) 12/16/2022   Thoracic aortic aneurysm (HCC) 12/16/2022   Sepsis due to pneumonia (HCC) 12/16/2022   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 tophus 02/07/2022   Stage 3b chronic kidney disease (HCC) 02/07/2022   Hyperlipidemia 05/04/2015   Encounter for therapeutic drug monitoring 11/24/2013   Exertional dyspnea 11/23/2013   Chronic atrial fibrillation (HCC)    Pneumonia, organism unspecified(486) 08/17/2012    ONSET DATE: 2-3  REFERRING DIAG:  R26.81 (ICD-10-CM) - Unsteadiness on feet  R68.89 (ICD-10-CM) - Other general symptoms and signs  Z87.01 (ICD-10-CM) - Personal history of pneumonia (recurrent)    THERAPY DIAG:  Abnormality of gait and mobility  Muscle weakness (generalized)  Unsteadiness on feet  Difficulty in walking, not elsewhere classified  Other abnormalities of gait and mobility  Other lack of coordination  Rationale for Evaluation and Treatment: Rehabilitation  SUBJECTIVE:                                                                                                                                                                                             SUBJECTIVE STATEMENT: Patient reports No significant issues since last visit.   Pt accompanied by:  wife- [name]   PERTINENT HISTORY: Patient is a 89 year old male with referall from PCP for imbalance and past medical history includes a recent hospitalization- 12/16/2022- 5 days for sepsis and PNA. PMH includes renal lesion, A-Fib, COPD, Depression, HTN, GOUT, Sleep apnea,  and HLD.  Wife reports he has been having balance issues for 2-3 years but having more unsteadiness recently  PAIN:  Are you having pain?  No  PRECAUTIONS: Fall  RED FLAGS: None   WEIGHT BEARING RESTRICTIONS: No  FALLS: Has patient fallen in last 6 months?  No falls but several near falls.   LIVING ENVIRONMENT: Lives with: lives with their family- wife (son stays during the day)  Lives in: House/apartment Stairs: Yes: External: 3 steps; on right going up Has following equipment at home: Single point cane, Rollator  PLOF: Requires assistive device for independence and Needs assistance with ADLs  PATIENT GOALS: To improve my balance and not fall  OBJECTIVE:  Note: Objective measures were completed at Evaluation unless otherwise noted.  DIAGNOSTIC FINDINGS:  CT SCAN- 12/16/2022 IMPRESSION: 1. No evidence for aortic dissection or aneurysm. 2. Ascending thoracic aortic aneurysm measuring 4.3 x 3.7 cm. Recommend annual imaging followup by CTA or MRA. This recommendation follows 2010 ACCF/AHA/AATS/ACR/ASA/SCA/SCAI/SIR/STS/SVM Guidelines for the Diagnosis and Management of Patients with Thoracic Aortic Disease. Circulation. 2010; 121: Q657-Q469. Aortic aneurysm NOS (ICD10-I71.9) 3. Pathologic mediastinal and right hilar lymphadenopathy of uncertain etiology. 4. Findings compatible with interstitial lung disease. 5. Ground-glass opacity in the right lower lobe measuring 3.3 cm. This may be infectious/inflammatory, but neoplasm can not be excluded. 6. 4.8 cm indeterminate right renal lesion, possibly complex cysts. Recommend further evaluation with dedicated CT or MRI. 7. Bladder diverticulum. 8. Colonic diverticulosis. 9. Prostatomegaly. 10. Small fat containing left inguinal hernia.     Electronically Signed   By: Darliss Cheney M.D.   On: 12/16/2022 23:30  COGNITION: Overall cognitive status: Impaired- forgetful   SENSATION: WFL  COORDINATION: Delayed at times with BLE  EDEMA:  None observed   POSTURE: rounded shoulders and forward head    LOWER EXTREMITY MMT:    MMT Right Eval Left Eval   Hip flexion 4 4  Hip extension    Hip abduction 4 4  Hip adduction 4 4  Hip internal rotation 4  4  Hip external rotation 4 4  Knee flexion 4 4  Knee extension 4 4  Ankle dorsiflexion 4 4  Ankle plantarflexion    Ankle inversion    Ankle eversion    (Blank rows = not tested)   TRANSFERS: Assistive device utilized: Single point cane  Sit to stand: Modified independence Stand to sit: Modified independence Chair to chair: Modified independence Floor:  Not tested   GAIT: Gait pattern: decreased arm swing- Right, decreased arm swing- Left, decreased step length- Right, decreased step length- Left, decreased stance time- Right, decreased stance time- Left, and decreased stride length Distance walked: > 200 feet in total Assistive device utilized: Single point cane Level of assistance: CGA Comments: decreased gait speed and VC to pick up feet  FUNCTIONAL TESTS:  5 times sit to stand: 22.68 sec withuout  Timed up and go (TUG): 13.99 sec with cane 6 minute walk test: To be performed next visit 10 meter walk test: 0.59 m/s Berg Balance Scale: 45/56  PATIENT SURVEYS:  FOTO 54  TODAY'S TREATMENT:                                                                                                                              DATE: 05/20/23 BP : 139/92  TA- To improve functional movements patterns for everyday tasks alternating with   -10 x STS -holding onto 5kg ball- some posterior lean- VC to perform ant trunk lean- "nose over toes"  -Forward lunge squat walk in // bars - down and back - 3 trips- BUE Support.   TE- To improve strength, endurance, mobility, and function of specific targeted muscle groups or improve joint range of motion or improve muscle flexibility Seated hip march 3# AW- 2 sets of 10 reps Seated LAQ 3# AW- 2 sets of 10 reps  GTB hamstring curl 2 sets of 10 reps  each LE   NMR: To facilitate reeducation of movement, balance, posture, coordination, and/or  proprioception/kinesthetic sense.  Standing with CGA next to support surface:   Forward step/walking in // bars up/over 1/2 foam roll x 3 in // bars then onto airex pad- 180 deg then repeat back to start- focusing on picking his feet.   Side stepping in // bars- over 1/2 foam roll x 3 and then up/onto 6" airex pad  Postural strengthening:  - Scap retraction GTB 2 sets x 10 reps -Lumbar flex into standing erect x 10 reps   tandem stance 30 seconds x 3 trials each LE    PATIENT EDUCATION: Education details: PT plan of care; goals Person educated: Patient Education method: Explanation Education comprehension: verbalized understanding  HOME EXERCISE PROGRAM:  Access Code: [email or web address] URL: [email or web address] Date: 03/20/2023 Prepared by: Maureen Ralphs  Exercises - Scapular Retraction with Resistance  - 1 x daily - 3 sets - 10 reps - Shoulder extension with resistance - Neutral  - 1 x daily - 7 x weekly - 3 sets -  10 reps   Access Code: [email or web address] URL: [email or web address] Date: 02/04/2023 Prepared by: Precious Bard  Exercises - Seated March  - 1 x daily - 7 x weekly - 2 sets - 10 reps - 5 hold - Leg Extension  - 1 x daily - 7 x weekly - 2 sets - 10 reps - 3 hold - Seated Hip Abduction with Resistance  - 1 x daily - 7 x weekly - 2 sets - 10 reps - 5 hold  GOALS: Goals reviewed with patient? Yes  SHORT TERM GOALS: Target date: 03/13/2023  Pt will be independent with HEP in order to improve strength and balance in order to decrease fall risk and improve function at home and work.  Baseline: EVAL: No formal HEP in place; 05/06/2023= Patient reports knowledgeable of HEP but states has not been very compliant.  Goal status: Partially Met    LONG TERM GOALS: Target date: 07/29/23  1.  Patient (> 69 years old) will complete five times sit to stand test in < 15 seconds indicating an increased LE strength and improved balance. Baseline: EVAL:  22.68 without UE support; 05/06/2023= 16.79 sec without UE support Goal status: PROGRESSING  2.  Patient will increase FOTO score to equal to or greater than  55   to demonstrate statistically significant improvement in mobility and quality of life.  Baseline: EVAL=54; 05/06/2023=62 Goal status: MET   3.  Patient will increase Berg Balance score by > 4 points to demonstrate decreased fall risk during functional activities. Baseline: 45; 05/06/2023= 47/56 Goal status: PROGRESSING   4.   Patient will reduce timed up and go to <11 seconds to reduce fall risk and demonstrate improved transfer/gait ability. Baseline: EVAL=13.99 sec with cane; 05/06/2023= 13.01 sec without a cane.  Goal status: PROGRESSING  5.   Patient will increase 10 meter walk test to >1.90m/s as to improve gait speed for better community ambulation and to reduce fall risk. Baseline: EVAL= 0.58 m/s; 05/06/2023= 0.75 m/s without an AD Goal status: PROGRESSING  6.   Patient will increase six minute walk test distance to >1000 for progression to community ambulator and improve gait ability Baseline: EVAL: to be assessed 2nd visit 10/29: 715 ft; 05/06/2023= 750 feet in 3 min 54 sec without AD Goal status: PROGRESSING    ASSESSMENT:  CLINICAL IMPRESSION: Patient presents with good motivation for today's session. He was able to progress with light resistance- using 3# AW with most activities. He was able to progress with improved foot clearance over obstacles with practice and improving lunge without any complaint of pain today. He participated well with minimal rest breaks.   Pt will continue to benefit from skilled physical therapy intervention to address impairments, improve QOL, and attain therapy goals      OBJECTIVE IMPAIRMENTS: Abnormal gait, decreased activity tolerance, decreased balance, decreased cognition, decreased coordination, decreased endurance, decreased mobility, difficulty walking, decreased strength, and  impaired flexibility.   ACTIVITY LIMITATIONS: carrying, lifting, bending, sitting, standing, squatting, stairs, and transfers  PARTICIPATION LIMITATIONS: cleaning, laundry, shopping, community activity, and yard work  PERSONAL FACTORS: Age and 1-2 comorbidities: COPD, HTN  are also affecting patient's functional outcome.   REHAB POTENTIAL: Good  CLINICAL DECISION MAKING: Evolving/moderate complexity  EVALUATION COMPLEXITY: Moderate  PLAN:  PT FREQUENCY: 1-2x/week  PT DURATION: 12 weeks  PLANNED INTERVENTIONS: 97164- PT Re-evaluation, 97110-Therapeutic exercises, 97530- Therapeutic activity, [id number]- Neuromuscular re-education, 97535- Self Care, 16109- Manual therapy, [id number]- Gait training, [phone]- Orthotic Fit/training, [phone]- Canalith repositioning, [id number]- Electrical stimulation (  manual), Patient/Family education, Balance training, Stair training, Taping, Dry Needling, Joint mobilization, Spinal mobilization, Vestibular training, Visual/preceptual remediation/compensation, DME instructions, Cryotherapy, and Moist heat  PLAN FOR NEXT SESSION:  Continue with balance training, and LE strengthening/conditioning to improve his overall functional endurance.    Lenda Kelp, PT 05/20/2023, 10:16 AM

## 2023-05-21 ENCOUNTER — Ambulatory Visit: Payer: No Typology Code available for payment source | Admitting: Physical Therapy

## 2023-05-22 ENCOUNTER — Ambulatory Visit: Payer: No Typology Code available for payment source

## 2023-05-22 DIAGNOSIS — R269 Unspecified abnormalities of gait and mobility: Secondary | ICD-10-CM | POA: Diagnosis not present

## 2023-05-22 DIAGNOSIS — R2681 Unsteadiness on feet: Secondary | ICD-10-CM

## 2023-05-22 DIAGNOSIS — M6281 Muscle weakness (generalized): Secondary | ICD-10-CM

## 2023-05-22 DIAGNOSIS — I480 Paroxysmal atrial fibrillation: Secondary | ICD-10-CM | POA: Diagnosis not present

## 2023-05-22 DIAGNOSIS — R262 Difficulty in walking, not elsewhere classified: Secondary | ICD-10-CM

## 2023-05-22 DIAGNOSIS — I10 Essential (primary) hypertension: Secondary | ICD-10-CM | POA: Diagnosis not present

## 2023-05-22 NOTE — Therapy (Signed)
OUTPATIENT PHYSICAL THERAPY NEURO TREATMENT  Patient Name: Craig Gilmore MRN: [id number] DOB:[birth year], 89 y.o., male Today's Date: 05/22/2023   PCP: Dr. Jerl Mina   REFERRING PROVIDER: Dr. Jerl Mina  END OF SESSION:  PT End of Session - 05/22/23 1445     Visit Number 16    Number of Visits 24    Date for PT Re-Evaluation 07/29/23    Progress Note Due on Visit 20    PT Start Time 1445    PT Stop Time 1529    PT Time Calculation (min) 44 min    Equipment Utilized During Treatment Gait belt    Activity Tolerance Patient tolerated treatment well    Behavior During Therapy WFL for tasks assessed/performed                           Past Medical History:  Diagnosis Date   Chronic atrial fibrillation (HCC)    a.) CHA2DS2VASc = 3 (age x 2, HTN);  b.) s/p DCCV 08/20/2004 --> 50 J x 1 and 100 J x 1 --> coverted with SB. (+) post-procedure nausea; c.) rate/rhythm maintained without pharmacological intervention; chronically anticoagulated with warfarin   Colonic polyp    COPD (chronic obstructive pulmonary disease) (HCC)    Depression    Dyspnea    Edema    GERD (gastroesophageal reflux disease)    Gout    Hemorrhoids    Hyperlipidemia    Hypertension    Insomnia    a.) on hypnotic (zolpidem)   Long term current use of anticoagulant    a.) warfarin   OSA on CPAP    Pneumonia    Pulmonary HTN (HCC) 02/14/2016   a.) TTE 02/14/2016: RVSP 37.5 mmHg   Sick sinus syndrome (HCC)    Squamous cell carcinoma in situ 10/03/2009   L medial infrapectoral   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   L nose supratip   Stage 3b chronic kidney disease (CKD) (HCC)    Past Surgical History:  Procedure Laterality Date   CARDIOVERSION N/A 08/21/2004   Procedure: CIRECT CURRENT CARDIOVERSION; Location: [facility]; Surgeon: Harold Hedge, MD   CATARACT EXTRACTION, BILATERAL     CHOLECYSTECTOMY     COLONOSCOPY     IR EMBO TUMOR ORGAN ISCHEMIA INFARCT INC GUIDE  ROADMAPPING  03/12/2023   IR RADIOLOGIST EVAL & MGMT  02/25/2023   IR RADIOLOGIST EVAL & MGMT  03/27/2023   NASAL SINUS SURGERY     Patient Active Problem List   Diagnosis Date Noted   Hyponatremia 12/19/2022   Sick sinus syndrome (HCC) 12/16/2022   Interstitial lung disease (HCC) 12/16/2022   Thoracic aortic aneurysm (HCC) 12/16/2022   Sepsis due to pneumonia (HCC) 12/16/2022   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 tophus 02/07/2022   Stage 3b chronic kidney disease (HCC) 02/07/2022   Hyperlipidemia 05/04/2015   Encounter for therapeutic drug monitoring 11/24/2013   Exertional dyspnea 11/23/2013   Chronic atrial fibrillation (HCC)    Pneumonia, organism unspecified(486) 08/17/2012    ONSET DATE: 2-3  REFERRING DIAG:  R26.81 (ICD-10-CM) - Unsteadiness on feet  R68.89 (ICD-10-CM) - Other general symptoms and signs  Z87.01 (ICD-10-CM) - Personal history of pneumonia (recurrent)    THERAPY DIAG:  Abnormality of gait and mobility  Muscle weakness (generalized)  Unsteadiness on feet  Difficulty in walking, not elsewhere classified  Rationale for Evaluation and Treatment: Rehabilitation  SUBJECTIVE:  SUBJECTIVE STATEMENT: Patient reports he is tired, got up early for blood work.   Pt accompanied by:  wife- [name]   PERTINENT HISTORY: Patient is a 89 year old male with referall from PCP for imbalance and past medical history includes a recent hospitalization- 12/16/2022- 5 days for sepsis and PNA. PMH includes renal lesion, A-Fib, COPD, Depression, HTN, GOUT, Sleep apnea,  and HLD.  Wife reports he has been having balance issues for 2-3 years but having more unsteadiness recently  PAIN:  Are you having pain? No  PRECAUTIONS: Fall  RED FLAGS: None   WEIGHT BEARING RESTRICTIONS:  No  FALLS: Has patient fallen in last 6 months?  No falls but several near falls.   LIVING ENVIRONMENT: Lives with: lives with their family- wife (son stays during the day)  Lives in: House/apartment Stairs: Yes: External: 3 steps; on right going up Has following equipment at home: Single point cane, Rollator  PLOF: Requires assistive device for independence and Needs assistance with ADLs  PATIENT GOALS: To improve my balance and not fall  OBJECTIVE:  Note: Objective measures were completed at Evaluation unless otherwise noted.  DIAGNOSTIC FINDINGS:  CT SCAN- 12/16/2022 IMPRESSION: 1. No evidence for aortic dissection or aneurysm. 2. Ascending thoracic aortic aneurysm measuring 4.3 x 3.7 cm. Recommend annual imaging followup by CTA or MRA. This recommendation follows 2010 ACCF/AHA/AATS/ACR/ASA/SCA/SCAI/SIR/STS/SVM Guidelines for the Diagnosis and Management of Patients with Thoracic Aortic Disease. Circulation. 2010; 121: Z610-R604. Aortic aneurysm NOS (ICD10-I71.9) 3. Pathologic mediastinal and right hilar lymphadenopathy of uncertain etiology. 4. Findings compatible with interstitial lung disease. 5. Ground-glass opacity in the right lower lobe measuring 3.3 cm. This may be infectious/inflammatory, but neoplasm can not be excluded. 6. 4.8 cm indeterminate right renal lesion, possibly complex cysts. Recommend further evaluation with dedicated CT or MRI. 7. Bladder diverticulum. 8. Colonic diverticulosis. 9. Prostatomegaly. 10. Small fat containing left inguinal hernia.     Electronically Signed   By: Darliss Cheney M.D.   On: 12/16/2022 23:30  COGNITION: Overall cognitive status: Impaired- forgetful   SENSATION: WFL  COORDINATION: Delayed at times with BLE  EDEMA:  None observed   POSTURE: rounded shoulders and forward head    LOWER EXTREMITY MMT:    MMT Right Eval Left Eval  Hip flexion 4 4  Hip extension    Hip abduction 4 4  Hip adduction 4 4   Hip internal rotation 4 4  Hip external rotation 4 4  Knee flexion 4 4  Knee extension 4 4  Ankle dorsiflexion 4 4  Ankle plantarflexion    Ankle inversion    Ankle eversion    (Blank rows = not tested)   TRANSFERS: Assistive device utilized: Single point cane  Sit to stand: Modified independence Stand to sit: Modified independence Chair to chair: Modified independence Floor:  Not tested   GAIT: Gait pattern: decreased arm swing- Right, decreased arm swing- Left, decreased step length- Right, decreased step length- Left, decreased stance time- Right, decreased stance time- Left, and decreased stride length Distance walked: > 200 feet in total Assistive device utilized: Single point cane Level of assistance: CGA Comments: decreased gait speed and VC to pick up feet  FUNCTIONAL TESTS:  5 times sit to stand: 22.68 sec withuout  Timed up and go (TUG): 13.99 sec with cane 6 minute walk test: To be performed next visit 10 meter walk test: 0.59 m/s Berg Balance Scale: 45/56  PATIENT SURVEYS:  FOTO 54  TODAY'S TREATMENT:  DATE: 05/22/23 BP : 139/92  TA- To improve functional movements patterns for everyday tasks alternating with   -10 x STS  some posterior lean- VC to perform ant trunk lean- "nose over toes"  Ambulate 150 ft no AD with cues for upright posture ; cues for foot clearance  Airex pad 4" step airex pad lateral step up/down 10x each side  Airex pad 4" step: forward/backward step/down 10x each side with UE support  4" step: seated toe taps 30 seconds x 3 trials   TE- To improve strength, endurance, mobility, and function of specific targeted muscle groups or improve joint range of motion or improve muscle flexibility Seated hip march 3# AW- 2 sets of 10 reps Seated LAQ 3# AW- 2 sets of 10 reps  Seated heel raise 3# AW 20x GTB hamstring  curl 2 sets of 10 reps  each LE  Slider abduction seated 15x   NMR: To facilitate reeducation of movement, balance, posture, coordination, and/or proprioception/kinesthetic sense.  Standing with CGA next to support surface:   Postural strengthening:  - Scap retraction GTB 2 sets x 15x      PATIENT EDUCATION: Education details: PT plan of care; goals Person educated: Patient Education method: Explanation Education comprehension: verbalized understanding  HOME EXERCISE PROGRAM:  Access Code: [email or web address] URL: [email or web address] Date: 03/20/2023 Prepared by: Maureen Ralphs  Exercises - Scapular Retraction with Resistance  - 1 x daily - 3 sets - 10 reps - Shoulder extension with resistance - Neutral  - 1 x daily - 7 x weekly - 3 sets - 10 reps   Access Code: [email or web address] URL: [email or web address] Date: 02/04/2023 Prepared by: Precious Bard  Exercises - Seated March  - 1 x daily - 7 x weekly - 2 sets - 10 reps - 5 hold - Leg Extension  - 1 x daily - 7 x weekly - 2 sets - 10 reps - 3 hold - Seated Hip Abduction with Resistance  - 1 x daily - 7 x weekly - 2 sets - 10 reps - 5 hold  GOALS: Goals reviewed with patient? Yes  SHORT TERM GOALS: Target date: 03/13/2023  Pt will be independent with HEP in order to improve strength and balance in order to decrease fall risk and improve function at home and work.  Baseline: EVAL: No formal HEP in place; 05/06/2023= Patient reports knowledgeable of HEP but states has not been very compliant.  Goal status: Partially Met    LONG TERM GOALS: Target date: 07/29/23  1.  Patient (> 41 years old) will complete five times sit to stand test in < 15 seconds indicating an increased LE strength and improved balance. Baseline: EVAL: 22.68 without UE support; 05/06/2023= 16.79 sec without UE support Goal status: PROGRESSING  2.  Patient will increase FOTO score to equal to or greater than  55   to demonstrate  statistically significant improvement in mobility and quality of life.  Baseline: EVAL=54; 05/06/2023=62 Goal status: MET   3.  Patient will increase Berg Balance score by > 4 points to demonstrate decreased fall risk during functional activities. Baseline: 45; 05/06/2023= 47/56 Goal status: PROGRESSING   4.   Patient will reduce timed up and go to <11 seconds to reduce fall risk and demonstrate improved transfer/gait ability. Baseline: EVAL=13.99 sec with cane; 05/06/2023= 13.01 sec without a cane.  Goal status: PROGRESSING  5.   Patient will increase 10 meter walk test to >1.18m/s as to improve gait speed for better community ambulation and to  reduce fall risk. Baseline: EVAL= 0.58 m/s; 05/06/2023= 0.75 m/s without an AD Goal status: PROGRESSING  6.   Patient will increase six minute walk test distance to >1000 for progression to community ambulator and improve gait ability Baseline: EVAL: to be assessed 2nd visit 10/29: 715 ft; 05/06/2023= 750 feet in 3 min 54 sec without AD Goal status: PROGRESSING    ASSESSMENT:  CLINICAL IMPRESSION: Patient presents with good motivation. He is fatigued today but able to perform exercises with occasional rest breaks. Patient is more challenged with stabilization on RLE this session than LLE.   Pt will continue to benefit from skilled physical therapy intervention to address impairments, improve QOL, and attain therapy goals      OBJECTIVE IMPAIRMENTS: Abnormal gait, decreased activity tolerance, decreased balance, decreased cognition, decreased coordination, decreased endurance, decreased mobility, difficulty walking, decreased strength, and impaired flexibility.   ACTIVITY LIMITATIONS: carrying, lifting, bending, sitting, standing, squatting, stairs, and transfers  PARTICIPATION LIMITATIONS: cleaning, laundry, shopping, community activity, and yard work  PERSONAL FACTORS: Age and 1-2 comorbidities: COPD, HTN  are also affecting patient's  functional outcome.   REHAB POTENTIAL: Good  CLINICAL DECISION MAKING: Evolving/moderate complexity  EVALUATION COMPLEXITY: Moderate  PLAN:  PT FREQUENCY: 1-2x/week  PT DURATION: 12 weeks  PLANNED INTERVENTIONS: [id number]- PT Re-evaluation, 97110-Therapeutic exercises, 97530- Therapeutic activity, [id number]- Neuromuscular re-education, 97535- Self Care, [id number]- Manual therapy, [id number]- Gait training, [phone]- Orthotic Fit/training, [phone]- Canalith repositioning, [phone]- Electrical stimulation (manual), Patient/Family education, Balance training, Stair training, Taping, Dry Needling, Joint mobilization, Spinal mobilization, Vestibular training, Visual/preceptual remediation/compensation, DME instructions, Cryotherapy, and Moist heat  PLAN FOR NEXT SESSION:  Continue with balance training, and LE strengthening/conditioning to improve his overall functional endurance.    Precious Bard, PT 05/22/2023, 3:29 PM

## 2023-05-26 ENCOUNTER — Ambulatory Visit: Payer: No Typology Code available for payment source

## 2023-05-26 NOTE — Therapy (Signed)
 OUTPATIENT PHYSICAL THERAPY NEURO TREATMENT  Patient Name: Craig Gilmore MRN: [id number] DOB:1934/06/09, 89 y.o., male Today's Date: 05/27/2023   PCP: Dr. Jerl Mina   REFERRING PROVIDER: Dr. Jerl Mina  END OF SESSION:  PT End of Session - 05/27/23 0943     Visit Number 17    Number of Visits 24    Date for PT Re-Evaluation 07/29/23    Progress Note Due on Visit 20    PT Start Time 0938    PT Stop Time 1014    PT Time Calculation (min) 36 min    Equipment Utilized During Treatment Gait belt    Activity Tolerance Patient tolerated treatment well    Behavior During Therapy WFL for tasks assessed/performed                            Past Medical History:  Diagnosis Date   Chronic atrial fibrillation (HCC)    a.) CHA2DS2VASc = 3 (age x 2, HTN);  b.) s/p DCCV 08/20/2004 --> 50 J x 1 and 100 J x 1 --> coverted with SB. (+) post-procedure nausea; c.) rate/rhythm maintained without pharmacological intervention; chronically anticoagulated with warfarin   Colonic polyp    COPD (chronic obstructive pulmonary disease) (HCC)    Depression    Dyspnea    Edema    GERD (gastroesophageal reflux disease)    Gout    Hemorrhoids    Hyperlipidemia    Hypertension    Insomnia    a.) on hypnotic (zolpidem)   Long term current use of anticoagulant    a.) warfarin   OSA on CPAP    Pneumonia    Pulmonary HTN (HCC) 02/14/2016   a.) TTE 02/14/2016: RVSP 37.5 mmHg   Sick sinus syndrome (HCC)    Squamous cell carcinoma in situ 10/03/2009   L medial infrapectoral   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   L nose supratip   Stage 3b chronic kidney disease (CKD) (HCC)    Past Surgical History:  Procedure Laterality Date   CARDIOVERSION N/A 08/21/2004   Procedure: CIRECT CURRENT CARDIOVERSION; Location: [facility]; Surgeon: Harold Hedge, MD   CATARACT EXTRACTION, BILATERAL     CHOLECYSTECTOMY     COLONOSCOPY     IR EMBO TUMOR ORGAN ISCHEMIA INFARCT INC GUIDE  ROADMAPPING  03/12/2023   IR RADIOLOGIST EVAL & MGMT  02/25/2023   IR RADIOLOGIST EVAL & MGMT  03/27/2023   NASAL SINUS SURGERY     Patient Active Problem List   Diagnosis Date Noted   Hyponatremia 12/19/2022   Sick sinus syndrome (HCC) 12/16/2022   Interstitial lung disease (HCC) 12/16/2022   Thoracic aortic aneurysm (HCC) 12/16/2022   Sepsis due to pneumonia (HCC) 12/16/2022   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 tophus 02/07/2022   Stage 3b chronic kidney disease (HCC) 02/07/2022   Hyperlipidemia 05/04/2015   Encounter for therapeutic drug monitoring 11/24/2013   Exertional dyspnea 11/23/2013   Chronic atrial fibrillation (HCC)    Pneumonia, organism unspecified(486) 08/17/2012    ONSET DATE: 2-3  REFERRING DIAG:  R26.81 (ICD-10-CM) - Unsteadiness on feet  R68.89 (ICD-10-CM) - Other general symptoms and signs  Z87.01 (ICD-10-CM) - Personal history of pneumonia (recurrent)    THERAPY DIAG:  Abnormality of gait and mobility  Muscle weakness (generalized)  Unsteadiness on feet  Difficulty in walking, not elsewhere classified  Other abnormalities of gait and mobility  Other lack of coordination  Rationale for Evaluation and  Treatment: Rehabilitation  SUBJECTIVE:                                                                                                                                                                                             SUBJECTIVE STATEMENT: Patient reports feeling a little "Off" today. No pain or dizziness and wife reports near fall coming down steps this morning.   Pt accompanied by:  wife- [name]   PERTINENT HISTORY: Patient is a 89 year old male with referall from PCP for imbalance and past medical history includes a recent hospitalization- 12/16/2022- 5 days for sepsis and PNA. PMH includes renal lesion, A-Fib, COPD, Depression, HTN, GOUT, Sleep apnea,  and HLD.  Wife reports he has been having balance issues for 2-3 years but having  more unsteadiness recently  PAIN:  Are you having pain? No  PRECAUTIONS: Fall  RED FLAGS: None   WEIGHT BEARING RESTRICTIONS: No  FALLS: Has patient fallen in last 6 months?  No falls but several near falls.   LIVING ENVIRONMENT: Lives with: lives with their family- wife (son stays during the day)  Lives in: House/apartment Stairs: Yes: External: 3 steps; on right going up Has following equipment at home: Single point cane, Rollator  PLOF: Requires assistive device for independence and Needs assistance with ADLs  PATIENT GOALS: To improve my balance and not fall  OBJECTIVE:  Note: Objective measures were completed at Evaluation unless otherwise noted.  DIAGNOSTIC FINDINGS:  CT SCAN- 12/16/2022 IMPRESSION: 1. No evidence for aortic dissection or aneurysm. 2. Ascending thoracic aortic aneurysm measuring 4.3 x 3.7 cm. Recommend annual imaging followup by CTA or MRA. This recommendation follows 2010 ACCF/AHA/AATS/ACR/ASA/SCA/SCAI/SIR/STS/SVM Guidelines for the Diagnosis and Management of Patients with Thoracic Aortic Disease. Circulation. 2010; 121: U981-X914. Aortic aneurysm NOS (ICD10-I71.9) 3. Pathologic mediastinal and right hilar lymphadenopathy of uncertain etiology. 4. Findings compatible with interstitial lung disease. 5. Ground-glass opacity in the right lower lobe measuring 3.3 cm. This may be infectious/inflammatory, but neoplasm can not be excluded. 6. 4.8 cm indeterminate right renal lesion, possibly complex cysts. Recommend further evaluation with dedicated CT or MRI. 7. Bladder diverticulum. 8. Colonic diverticulosis. 9. Prostatomegaly. 10. Small fat containing left inguinal hernia.     Electronically Signed   By: Darliss Cheney M.D.   On: 12/16/2022 23:30  COGNITION: Overall cognitive status: Impaired- forgetful   SENSATION: WFL  COORDINATION: Delayed at times with BLE  EDEMA:  None observed   POSTURE: rounded shoulders and forward  head    LOWER EXTREMITY MMT:    MMT Right Eval Left Eval  Hip flexion 4 4  Hip extension  Hip abduction 4 4  Hip adduction 4 4  Hip internal rotation 4 4  Hip external rotation 4 4  Knee flexion 4 4  Knee extension 4 4  Ankle dorsiflexion 4 4  Ankle plantarflexion    Ankle inversion    Ankle eversion    (Blank rows = not tested)   TRANSFERS: Assistive device utilized: Single point cane  Sit to stand: Modified independence Stand to sit: Modified independence Chair to chair: Modified independence Floor:  Not tested   GAIT: Gait pattern: decreased arm swing- Right, decreased arm swing- Left, decreased step length- Right, decreased step length- Left, decreased stance time- Right, decreased stance time- Left, and decreased stride length Distance walked: > 200 feet in total Assistive device utilized: Single point cane Level of assistance: CGA Comments: decreased gait speed and VC to pick up feet  FUNCTIONAL TESTS:  5 times sit to stand: 22.68 sec withuout  Timed up and go (TUG): 13.99 sec with cane 6 minute walk test: To be performed next visit 10 meter walk test: 0.59 m/s Berg Balance Scale: 45/56  PATIENT SURVEYS:  FOTO 54  TODAY'S TREATMENT:                                                                                                                              DATE: 05/27/23   BP : 138/86 mmHg L UE  THERAPEUTIC ACTIVITIES: To improve functional movements patterns for everyday tasks    -15 x STS  while holding onto 5kg ball  without VC for "nose over toes."   Ambulate 300 ft no AD with cues for upright posture ; cues for foot clearance; intermittent veering to right but no LOB.      TE- To improve strength, endurance, mobility, and function of specific targeted muscle groups or improve joint range of motion or improve muscle flexibility Seated hip march 3# AW- 2 sets of 10 reps Seated LAQ 3# AW- 2 sets of 10 reps     NMR: To facilitate  reeducation of movement, balance, posture, coordination, and/or proprioception/kinesthetic sense.  Standing with CGA next to support surface:  Step tapping onto cone with minimal UE support x 20 reps ea LE  Dynamic Lateral steps 3#  x 20 reps L/R  Postural strengthening:  -Standing at wall with Overhead ball raise x 15 reps  -Standing horizontal shoulder abd at wall x 15 reps  - Scap retraction GTB 2 sets x 15x      PATIENT EDUCATION: Education details: PT plan of care; goals Person educated: Patient Education method: Explanation Education comprehension: verbalized understanding  HOME EXERCISE PROGRAM:  Access Code: [email or web address] URL: [email or web address] Date: 03/20/2023 Prepared by: Maureen Ralphs  Exercises - Scapular Retraction with Resistance  - 1 x daily - 3 sets - 10 reps - Shoulder extension with resistance - Neutral  - 1 x daily - 7 x weekly - 3 sets - 10 reps   Access Code: [email or web address] URL:  [email or web address] Date: 02/04/2023 Prepared by: Precious Bard  Exercises - Seated March  - 1 x daily - 7 x weekly - 2 sets - 10 reps - 5 hold - Leg Extension  - 1 x daily - 7 x weekly - 2 sets - 10 reps - 3 hold - Seated Hip Abduction with Resistance  - 1 x daily - 7 x weekly - 2 sets - 10 reps - 5 hold  GOALS: Goals reviewed with patient? Yes  SHORT TERM GOALS: Target date: 03/13/2023  Pt will be independent with HEP in order to improve strength and balance in order to decrease fall risk and improve function at home and work.  Baseline: EVAL: No formal HEP in place; 05/06/2023= Patient reports knowledgeable of HEP but states has not been very compliant.  Goal status: Partially Met    LONG TERM GOALS: Target date: 07/29/23  1.  Patient (> 63 years old) will complete five times sit to stand test in < 15 seconds indicating an increased LE strength and improved balance. Baseline: EVAL: 22.68 without UE support; 05/06/2023= 16.79 sec  without UE support Goal status: PROGRESSING  2.  Patient will increase FOTO score to equal to or greater than  55   to demonstrate statistically significant improvement in mobility and quality of life.  Baseline: EVAL=54; 05/06/2023=62 Goal status: MET   3.  Patient will increase Berg Balance score by > 4 points to demonstrate decreased fall risk during functional activities. Baseline: 45; 05/06/2023= 47/56 Goal status: PROGRESSING   4.   Patient will reduce timed up and go to <11 seconds to reduce fall risk and demonstrate improved transfer/gait ability. Baseline: EVAL=13.99 sec with cane; 05/06/2023= 13.01 sec without a cane.  Goal status: PROGRESSING  5.   Patient will increase 10 meter walk test to >1.44m/s as to improve gait speed for better community ambulation and to reduce fall risk. Baseline: EVAL= 0.58 m/s; 05/06/2023= 0.75 m/s without an AD Goal status: PROGRESSING  6.   Patient will increase six minute walk test distance to >1000 for progression to community ambulator and improve gait ability Baseline: EVAL: to be assessed 2nd visit 10/29: 715 ft; 05/06/2023= 750 feet in 3 min 54 sec without AD Goal status: PROGRESSING    ASSESSMENT:  CLINICAL IMPRESSION: Patient presents with good motivation. Patient was challenged with postural strengthening requiring increased VC and later with walking-repetitive VC to keep head up. He was fatigued some with postural strengthening as well today. Pt will continue to benefit from skilled physical therapy intervention to address impairments, improve QOL, and attain therapy goals      OBJECTIVE IMPAIRMENTS: Abnormal gait, decreased activity tolerance, decreased balance, decreased cognition, decreased coordination, decreased endurance, decreased mobility, difficulty walking, decreased strength, and impaired flexibility.   ACTIVITY LIMITATIONS: carrying, lifting, bending, sitting, standing, squatting, stairs, and transfers  PARTICIPATION  LIMITATIONS: cleaning, laundry, shopping, community activity, and yard work  PERSONAL FACTORS: Age and 1-2 comorbidities: COPD, HTN  are also affecting patient's functional outcome.   REHAB POTENTIAL: Good  CLINICAL DECISION MAKING: Evolving/moderate complexity  EVALUATION COMPLEXITY: Moderate  PLAN:  PT FREQUENCY: 1-2x/week  PT DURATION: 12 weeks  PLANNED INTERVENTIONS: 97164- PT Re-evaluation, 97110-Therapeutic exercises, 97530- Therapeutic activity, [id number]- Neuromuscular re-education, 97535- Self Care, 16109- Manual therapy, [id number]- Gait training, [phone]- Orthotic Fit/training, [phone]- Canalith repositioning, [phone]- Electrical stimulation (manual), Patient/Family education, Balance training, Stair training, Taping, Dry Needling, Joint mobilization, Spinal mobilization, Vestibular training, Visual/preceptual remediation/compensation, DME instructions, Cryotherapy, and Moist heat  PLAN FOR NEXT  SESSION:  Continue with balance training, and LE strengthening/conditioning to improve his overall functional endurance.    Lenda Kelp, PT 05/27/2023, 1:12 PM

## 2023-05-27 ENCOUNTER — Ambulatory Visit: Payer: No Typology Code available for payment source

## 2023-05-27 DIAGNOSIS — M6281 Muscle weakness (generalized): Secondary | ICD-10-CM

## 2023-05-27 DIAGNOSIS — R269 Unspecified abnormalities of gait and mobility: Secondary | ICD-10-CM | POA: Diagnosis not present

## 2023-05-27 DIAGNOSIS — R2689 Other abnormalities of gait and mobility: Secondary | ICD-10-CM

## 2023-05-27 DIAGNOSIS — R278 Other lack of coordination: Secondary | ICD-10-CM

## 2023-05-27 DIAGNOSIS — R262 Difficulty in walking, not elsewhere classified: Secondary | ICD-10-CM

## 2023-05-27 DIAGNOSIS — R2681 Unsteadiness on feet: Secondary | ICD-10-CM

## 2023-05-28 ENCOUNTER — Ambulatory Visit: Payer: No Typology Code available for payment source | Admitting: Physical Therapy

## 2023-05-29 ENCOUNTER — Ambulatory Visit: Payer: No Typology Code available for payment source

## 2023-05-30 DIAGNOSIS — Z85828 Personal history of other malignant neoplasm of skin: Secondary | ICD-10-CM | POA: Diagnosis not present

## 2023-06-02 ENCOUNTER — Ambulatory Visit: Payer: No Typology Code available for payment source

## 2023-06-02 NOTE — Therapy (Incomplete)
 OUTPATIENT PHYSICAL THERAPY NEURO TREATMENT  Patient Name: Craig Gilmore MRN: [id number] DOB:06/30/1934, 89 y.o., male Today's Date: 06/02/2023   PCP: Dr. Jerl Mina   REFERRING PROVIDER: Dr. Jerl Mina  END OF SESSION:                   Past Medical History:  Diagnosis Date   Chronic atrial fibrillation [facility])    a.) CHA2DS2VASc = 3 (age x 2, HTN);  b.) s/p DCCV 08/20/2004 --> 50 J x 1 and 100 J x 1 --> coverted with SB. (+) post-procedure nausea; c.) rate/rhythm maintained without pharmacological intervention; chronically anticoagulated with warfarin   Colonic polyp    COPD (chronic obstructive pulmonary disease) (HCC)    Depression    Dyspnea    Edema    GERD (gastroesophageal reflux disease)    Gout    Hemorrhoids    Hyperlipidemia    Hypertension    Insomnia    a.) on hypnotic (zolpidem)   Long term current use of anticoagulant    a.) warfarin   OSA on CPAP    Pneumonia    Pulmonary HTN (HCC) 02/14/2016   a.) TTE 02/14/2016: RVSP 37.5 mmHg   Sick sinus syndrome (HCC)    Squamous cell carcinoma in situ 10/03/2009   L medial infrapectoral   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   L nose supratip   Stage 3b chronic kidney disease (CKD) (HCC)    Past Surgical History:  Procedure Laterality Date   CARDIOVERSION N/A 08/21/2004   Procedure: CIRECT CURRENT CARDIOVERSION; Location: [facility]; Surgeon: Harold Hedge, MD   CATARACT EXTRACTION, BILATERAL     CHOLECYSTECTOMY     COLONOSCOPY     IR EMBO TUMOR ORGAN ISCHEMIA INFARCT INC GUIDE ROADMAPPING  03/12/2023   IR RADIOLOGIST EVAL & MGMT  02/25/2023   IR RADIOLOGIST EVAL & MGMT  03/27/2023   NASAL SINUS SURGERY     Patient Active Problem List   Diagnosis Date Noted   Hyponatremia 12/19/2022   Sick sinus syndrome (HCC) 12/16/2022   Interstitial lung disease (HCC) 12/16/2022   Thoracic aortic aneurysm (HCC) 12/16/2022   Sepsis due to pneumonia (HCC) 12/16/2022   Idiopathic chronic gout of  foot without tophus 02/07/2022   Stage 3b chronic kidney disease (HCC) 02/07/2022   Hyperlipidemia 05/04/2015   Encounter for therapeutic drug monitoring 11/24/2013   Exertional dyspnea 11/23/2013   Chronic atrial fibrillation (HCC)    Pneumonia, organism unspecified(486) 08/17/2012    ONSET DATE: 2-3  REFERRING DIAG:  R26.81 (ICD-10-CM) - Unsteadiness on feet  R68.89 (ICD-10-CM) - Other general symptoms and signs  Z87.01 (ICD-10-CM) - Personal history of pneumonia (recurrent)    THERAPY DIAG:  No diagnosis found.  Rationale for Evaluation and Treatment: Rehabilitation  SUBJECTIVE:  SUBJECTIVE STATEMENT: ***  Pt accompanied by:  wife- [name]   PERTINENT HISTORY: Patient is a 89 year old male with referall from PCP for imbalance and past medical history includes a recent hospitalization- 12/16/2022- 5 days for sepsis and PNA. PMH includes renal lesion, A-Fib, COPD, Depression, HTN, GOUT, Sleep apnea,  and HLD.  Wife reports he has been having balance issues for 2-3 years but having more unsteadiness recently  PAIN:  Are you having pain? No  PRECAUTIONS: Fall  RED FLAGS: None   WEIGHT BEARING RESTRICTIONS: No  FALLS: Has patient fallen in last 6 months?  No falls but several near falls.   LIVING ENVIRONMENT: Lives with: lives with their family- wife (son stays during the day)  Lives in: House/apartment Stairs: Yes: External: 3 steps; on right going up Has following equipment at home: Single point cane, Rollator  PLOF: Requires assistive device for independence and Needs assistance with ADLs  PATIENT GOALS: To improve my balance and not fall  OBJECTIVE:  Note: Objective measures were completed at Evaluation unless otherwise noted.  DIAGNOSTIC FINDINGS:  CT SCAN-  12/16/2022 IMPRESSION: 1. No evidence for aortic dissection or aneurysm. 2. Ascending thoracic aortic aneurysm measuring 4.3 x 3.7 cm. Recommend annual imaging followup by CTA or MRA. This recommendation follows 2010 ACCF/AHA/AATS/ACR/ASA/SCA/SCAI/SIR/STS/SVM Guidelines for the Diagnosis and Management of Patients with Thoracic Aortic Disease. Circulation. 2010; 121: Z610-R604. Aortic aneurysm NOS (ICD10-I71.9) 3. Pathologic mediastinal and right hilar lymphadenopathy of uncertain etiology. 4. Findings compatible with interstitial lung disease. 5. Ground-glass opacity in the right lower lobe measuring 3.3 cm. This may be infectious/inflammatory, but neoplasm can not be excluded. 6. 4.8 cm indeterminate right renal lesion, possibly complex cysts. Recommend further evaluation with dedicated CT or MRI. 7. Bladder diverticulum. 8. Colonic diverticulosis. 9. Prostatomegaly. 10. Small fat containing left inguinal hernia.     Electronically Signed   By: Darliss Cheney M.D.   On: 12/16/2022 23:30  COGNITION: Overall cognitive status: Impaired- forgetful   SENSATION: WFL  COORDINATION: Delayed at times with BLE  EDEMA:  None observed   POSTURE: rounded shoulders and forward head    LOWER EXTREMITY MMT:    MMT Right Eval Left Eval  Hip flexion 4 4  Hip extension    Hip abduction 4 4  Hip adduction 4 4  Hip internal rotation 4 4  Hip external rotation 4 4  Knee flexion 4 4  Knee extension 4 4  Ankle dorsiflexion 4 4  Ankle plantarflexion    Ankle inversion    Ankle eversion    (Blank rows = not tested)   TRANSFERS: Assistive device utilized: Single point cane  Sit to stand: Modified independence Stand to sit: Modified independence Chair to chair: Modified independence Floor:  Not tested   GAIT: Gait pattern: decreased arm swing- Right, decreased arm swing- Left, decreased step length- Right, decreased step length- Left, decreased stance time- Right,  decreased stance time- Left, and decreased stride length Distance walked: > 200 feet in total Assistive device utilized: Single point cane Level of assistance: CGA Comments: decreased gait speed and VC to pick up feet  FUNCTIONAL TESTS:  5 times sit to stand: 22.68 sec withuout  Timed up and go (TUG): 13.99 sec with cane 6 minute walk test: To be performed next visit 10 meter walk test: 0.59 m/s Berg Balance Scale: 45/56  PATIENT SURVEYS:  FOTO 54  TODAY'S TREATMENT:  DATE: 06/02/23   BP : 138/86 mmHg L UE  THERAPEUTIC ACTIVITIES: To improve functional movements patterns for everyday tasks    -15 x STS  while holding onto 5kg ball  without VC for "nose over toes."   Ambulate 300 ft no AD with cues for upright posture ; cues for foot clearance; intermittent veering to right but no LOB.      TE- To improve strength, endurance, mobility, and function of specific targeted muscle groups or improve joint range of motion or improve muscle flexibility Seated hip march 3# AW- 2 sets of 10 reps Seated LAQ 3# AW- 2 sets of 10 reps     NMR: To facilitate reeducation of movement, balance, posture, coordination, and/or proprioception/kinesthetic sense.  Standing with CGA next to support surface:  Step tapping onto cone with minimal UE support x 20 reps ea LE  Dynamic Lateral steps 3#  x 20 reps L/R  Postural strengthening:  -Standing at wall with Overhead ball raise x 15 reps  -Standing horizontal shoulder abd at wall x 15 reps  - Scap retraction GTB 2 sets x 15x      PATIENT EDUCATION: Education details: PT plan of care; goals Person educated: Patient Education method: Explanation Education comprehension: verbalized understanding  HOME EXERCISE PROGRAM:  Access Code: [email or web address] URL: [email or web address] Date:  03/20/2023 Prepared by: Maureen Ralphs  Exercises - Scapular Retraction with Resistance  - 1 x daily - 3 sets - 10 reps - Shoulder extension with resistance - Neutral  - 1 x daily - 7 x weekly - 3 sets - 10 reps   Access Code: [email or web address] URL: [email or web address] Date: 02/04/2023 Prepared by: Precious Bard  Exercises - Seated March  - 1 x daily - 7 x weekly - 2 sets - 10 reps - 5 hold - Leg Extension  - 1 x daily - 7 x weekly - 2 sets - 10 reps - 3 hold - Seated Hip Abduction with Resistance  - 1 x daily - 7 x weekly - 2 sets - 10 reps - 5 hold  GOALS: Goals reviewed with patient? Yes  SHORT TERM GOALS: Target date: 03/13/2023  Pt will be independent with HEP in order to improve strength and balance in order to decrease fall risk and improve function at home and work.  Baseline: EVAL: No formal HEP in place; 05/06/2023= Patient reports knowledgeable of HEP but states has not been very compliant.  Goal status: Partially Met    LONG TERM GOALS: Target date: 07/29/23  1.  Patient (> 24 years old) will complete five times sit to stand test in < 15 seconds indicating an increased LE strength and improved balance. Baseline: EVAL: 22.68 without UE support; 05/06/2023= 16.79 sec without UE support Goal status: PROGRESSING  2.  Patient will increase FOTO score to equal to or greater than  55   to demonstrate statistically significant improvement in mobility and quality of life.  Baseline: EVAL=54; 05/06/2023=62 Goal status: MET   3.  Patient will increase Berg Balance score by > 4 points to demonstrate decreased fall risk during functional activities. Baseline: 45; 05/06/2023= 47/56 Goal status: PROGRESSING   4.   Patient will reduce timed up and go to <11 seconds to reduce fall risk and demonstrate improved transfer/gait ability. Baseline: EVAL=13.99 sec with cane; 05/06/2023= 13.01 sec without a cane.  Goal status: PROGRESSING  5.   Patient will increase 10 meter  walk test to >1.51m/s as to improve gait speed for better community ambulation  and to reduce fall risk. Baseline: EVAL= 0.58 m/s; 05/06/2023= 0.75 m/s without an AD Goal status: PROGRESSING  6.   Patient will increase six minute walk test distance to >1000 for progression to community ambulator and improve gait ability Baseline: EVAL: to be assessed 2nd visit 10/29: 715 ft; 05/06/2023= 750 feet in 3 min 54 sec without AD Goal status: PROGRESSING    ASSESSMENT:  CLINICAL IMPRESSION: ***Pt will continue to benefit from skilled physical therapy intervention to address impairments, improve QOL, and attain therapy goals      OBJECTIVE IMPAIRMENTS: Abnormal gait, decreased activity tolerance, decreased balance, decreased cognition, decreased coordination, decreased endurance, decreased mobility, difficulty walking, decreased strength, and impaired flexibility.   ACTIVITY LIMITATIONS: carrying, lifting, bending, sitting, standing, squatting, stairs, and transfers  PARTICIPATION LIMITATIONS: cleaning, laundry, shopping, community activity, and yard work  PERSONAL FACTORS: Age and 1-2 comorbidities: COPD, HTN  are also affecting patient's functional outcome.   REHAB POTENTIAL: Good  CLINICAL DECISION MAKING: Evolving/moderate complexity  EVALUATION COMPLEXITY: Moderate  PLAN:  PT FREQUENCY: 1-2x/week  PT DURATION: 12 weeks  PLANNED INTERVENTIONS: 97164- PT Re-evaluation, 97110-Therapeutic exercises, 97530- Therapeutic activity, [id number]- Neuromuscular re-education, 97535- Self Care, 16109- Manual therapy, [id number]- Gait training, [phone]- Orthotic Fit/training, [phone]- Canalith repositioning, [phone]- Electrical stimulation (manual), Patient/Family education, Balance training, Stair training, Taping, Dry Needling, Joint mobilization, Spinal mobilization, Vestibular training, Visual/preceptual remediation/compensation, DME instructions, Cryotherapy, and Moist heat  PLAN FOR NEXT SESSION:  Continue  with balance training, and LE strengthening/conditioning to improve his overall functional endurance.    Precious Bard, PT 06/02/2023, 1:23 PM

## 2023-06-03 ENCOUNTER — Ambulatory Visit: Payer: No Typology Code available for payment source

## 2023-06-04 ENCOUNTER — Ambulatory Visit: Payer: No Typology Code available for payment source | Admitting: Physical Therapy

## 2023-06-04 NOTE — Therapy (Signed)
 OUTPATIENT PHYSICAL THERAPY NEURO TREATMENT  Patient Name: Craig Gilmore MRN: [id number] DOB:02/04/1935, 89 y.o., male Today's Date: 06/05/2023   PCP: Dr. Jerl Mina   REFERRING PROVIDER: Dr. Jerl Mina  END OF SESSION:  PT End of Session - 06/05/23 0931     Visit Number 18    Number of Visits 24    Date for PT Re-Evaluation 07/29/23    Progress Note Due on Visit 20    PT Start Time 0930    PT Stop Time 1014    PT Time Calculation (min) 44 min    Equipment Utilized During Treatment Gait belt    Activity Tolerance Patient tolerated treatment well    Behavior During Therapy WFL for tasks assessed/performed                             Past Medical History:  Diagnosis Date   Chronic atrial fibrillation (HCC)    a.) CHA2DS2VASc = 3 (age x 2, HTN);  b.) s/p DCCV 08/20/2004 --> 50 J x 1 and 100 J x 1 --> coverted with SB. (+) post-procedure nausea; c.) rate/rhythm maintained without pharmacological intervention; chronically anticoagulated with warfarin   Colonic polyp    COPD (chronic obstructive pulmonary disease) (HCC)    Depression    Dyspnea    Edema    GERD (gastroesophageal reflux disease)    Gout    Hemorrhoids    Hyperlipidemia    Hypertension    Insomnia    a.) on hypnotic (zolpidem)   Long term current use of anticoagulant    a.) warfarin   OSA on CPAP    Pneumonia    Pulmonary HTN (HCC) 02/14/2016   a.) TTE 02/14/2016: RVSP 37.5 mmHg   Sick sinus syndrome (HCC)    Squamous cell carcinoma in situ 10/03/2009   L medial infrapectoral   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   L nose supratip   Stage 3b chronic kidney disease (CKD) (HCC)    Past Surgical History:  Procedure Laterality Date   CARDIOVERSION N/A 08/21/2004   Procedure: CIRECT CURRENT CARDIOVERSION; Location: [facility]; Surgeon: Harold Hedge, MD   CATARACT EXTRACTION, BILATERAL     CHOLECYSTECTOMY     COLONOSCOPY     IR EMBO TUMOR ORGAN ISCHEMIA INFARCT INC GUIDE  ROADMAPPING  03/12/2023   IR RADIOLOGIST EVAL & MGMT  02/25/2023   IR RADIOLOGIST EVAL & MGMT  03/27/2023   NASAL SINUS SURGERY     Patient Active Problem List   Diagnosis Date Noted   Hyponatremia 12/19/2022   Sick sinus syndrome (HCC) 12/16/2022   Interstitial lung disease (HCC) 12/16/2022   Thoracic aortic aneurysm (HCC) 12/16/2022   Sepsis due to pneumonia (HCC) 12/16/2022   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 tophus 02/07/2022   Stage 3b chronic kidney disease (HCC) 02/07/2022   Hyperlipidemia 05/04/2015   Encounter for therapeutic drug monitoring 11/24/2013   Exertional dyspnea 11/23/2013   Chronic atrial fibrillation (HCC)    Pneumonia, organism unspecified(486) 08/17/2012    ONSET DATE: 2-3  REFERRING DIAG:  R26.81 (ICD-10-CM) - Unsteadiness on feet  R68.89 (ICD-10-CM) - Other general symptoms and signs  Z87.01 (ICD-10-CM) - Personal history of pneumonia (recurrent)    THERAPY DIAG:  Abnormality of gait and mobility  Muscle weakness (generalized)  Unsteadiness on feet  Difficulty in walking, not elsewhere classified  Rationale for Evaluation and Treatment: Rehabilitation  SUBJECTIVE:  SUBJECTIVE STATEMENT: Patient goes today to doctor for his stomach pain.   Pt accompanied by:  wife- [name]   PERTINENT HISTORY: Patient is a 89 year old male with referall from PCP for imbalance and past medical history includes a recent hospitalization- 12/16/2022- 5 days for sepsis and PNA. PMH includes renal lesion, A-Fib, COPD, Depression, HTN, GOUT, Sleep apnea,  and HLD.  Wife reports he has been having balance issues for 2-3 years but having more unsteadiness recently  PAIN:  Are you having pain? No  PRECAUTIONS: Fall  RED FLAGS: None   WEIGHT BEARING RESTRICTIONS: No  FALLS:  Has patient fallen in last 6 months?  No falls but several near falls.   LIVING ENVIRONMENT: Lives with: lives with their family- wife (son stays during the day)  Lives in: House/apartment Stairs: Yes: External: 3 steps; on right going up Has following equipment at home: Single point cane, Rollator  PLOF: Requires assistive device for independence and Needs assistance with ADLs  PATIENT GOALS: To improve my balance and not fall  OBJECTIVE:  Note: Objective measures were completed at Evaluation unless otherwise noted.  DIAGNOSTIC FINDINGS:  CT SCAN- 12/16/2022 IMPRESSION: 1. No evidence for aortic dissection or aneurysm. 2. Ascending thoracic aortic aneurysm measuring 4.3 x 3.7 cm. Recommend annual imaging followup by CTA or MRA. This recommendation follows 2010 ACCF/AHA/AATS/ACR/ASA/SCA/SCAI/SIR/STS/SVM Guidelines for the Diagnosis and Management of Patients with Thoracic Aortic Disease. Circulation. 2010; 121: Z610-R604. Aortic aneurysm NOS (ICD10-I71.9) 3. Pathologic mediastinal and right hilar lymphadenopathy of uncertain etiology. 4. Findings compatible with interstitial lung disease. 5. Ground-glass opacity in the right lower lobe measuring 3.3 cm. This may be infectious/inflammatory, but neoplasm can not be excluded. 6. 4.8 cm indeterminate right renal lesion, possibly complex cysts. Recommend further evaluation with dedicated CT or MRI. 7. Bladder diverticulum. 8. Colonic diverticulosis. 9. Prostatomegaly. 10. Small fat containing left inguinal hernia.     Electronically Signed   By: Darliss Cheney M.D.   On: 12/16/2022 23:30  COGNITION: Overall cognitive status: Impaired- forgetful   SENSATION: WFL  COORDINATION: Delayed at times with BLE  EDEMA:  None observed   POSTURE: rounded shoulders and forward head    LOWER EXTREMITY MMT:    MMT Right Eval Left Eval  Hip flexion 4 4  Hip extension    Hip abduction 4 4  Hip adduction 4 4  Hip internal  rotation 4 4  Hip external rotation 4 4  Knee flexion 4 4  Knee extension 4 4  Ankle dorsiflexion 4 4  Ankle plantarflexion    Ankle inversion    Ankle eversion    (Blank rows = not tested)   TRANSFERS: Assistive device utilized: Single point cane  Sit to stand: Modified independence Stand to sit: Modified independence Chair to chair: Modified independence Floor:  Not tested   GAIT: Gait pattern: decreased arm swing- Right, decreased arm swing- Left, decreased step length- Right, decreased step length- Left, decreased stance time- Right, decreased stance time- Left, and decreased stride length Distance walked: > 200 feet in total Assistive device utilized: Single point cane Level of assistance: CGA Comments: decreased gait speed and VC to pick up feet  FUNCTIONAL TESTS:  5 times sit to stand: 22.68 sec withuout  Timed up and go (TUG): 13.99 sec with cane 6 minute walk test: To be performed next visit 10 meter walk test: 0.59 m/s Berg Balance Scale: 45/56  PATIENT SURVEYS:  FOTO 54  TODAY'S TREATMENT:  DATE: 06/05/23   BP : 138/91  mmHg R UE  THERAPEUTIC ACTIVITIES: To improve functional movements patterns for everyday tasks    Superset: 2 sets -15 x STS  while holding onto 5kg ball  without VC for "nose over toes." -Ambulate 300 ft no AD with cues for upright posture ; cues for foot clearance; intermittent veering to right but no LOB.    Sit to stand punch mitts 10x each side   Weaving between 8 cones ; 4 sets   TE- To improve strength, endurance, mobility, and function of specific targeted muscle groups or improve joint range of motion or improve muscle flexibility Seated hip march 4# AW- 2 sets of 10 reps Seated LAQ 4# AW- 2 sets of 10 reps  Seated adduction with heel raise with #4 AW 10x; 2 sets   seated abduction/ER IR with adduction  green ball 10x       PATIENT EDUCATION: Education details: PT plan of care; goals Person educated: Patient Education method: Explanation Education comprehension: verbalized understanding  HOME EXERCISE PROGRAM:  Access Code: [email or web address] URL: [email or web address] Date: 03/20/2023 Prepared by: Maureen Ralphs  Exercises - Scapular Retraction with Resistance  - 1 x daily - 3 sets - 10 reps - Shoulder extension with resistance - Neutral  - 1 x daily - 7 x weekly - 3 sets - 10 reps   Access Code: [email or web address] URL: [email or web address] Date: 02/04/2023 Prepared by: Precious Bard  Exercises - Seated March  - 1 x daily - 7 x weekly - 2 sets - 10 reps - 5 hold - Leg Extension  - 1 x daily - 7 x weekly - 2 sets - 10 reps - 3 hold - Seated Hip Abduction with Resistance  - 1 x daily - 7 x weekly - 2 sets - 10 reps - 5 hold  GOALS: Goals reviewed with patient? Yes  SHORT TERM GOALS: Target date: 03/13/2023  Pt will be independent with HEP in order to improve strength and balance in order to decrease fall risk and improve function at home and work.  Baseline: EVAL: No formal HEP in place; 05/06/2023= Patient reports knowledgeable of HEP but states has not been very compliant.  Goal status: Partially Met    LONG TERM GOALS: Target date: 07/29/23  1.  Patient (> 45 years old) will complete five times sit to stand test in < 15 seconds indicating an increased LE strength and improved balance. Baseline: EVAL: 22.68 without UE support; 05/06/2023= 16.79 sec without UE support Goal status: PROGRESSING  2.  Patient will increase FOTO score to equal to or greater than  55   to demonstrate statistically significant improvement in mobility and quality of life.  Baseline: EVAL=54; 05/06/2023=62 Goal status: MET   3.  Patient will increase Berg Balance score by > 4 points to demonstrate decreased fall risk during functional activities. Baseline: 45; 05/06/2023=  47/56 Goal status: PROGRESSING   4.   Patient will reduce timed up and go to <11 seconds to reduce fall risk and demonstrate improved transfer/gait ability. Baseline: EVAL=13.99 sec with cane; 05/06/2023= 13.01 sec without a cane.  Goal status: PROGRESSING  5.   Patient will increase 10 meter walk test to >1.25m/s as to improve gait speed for better community ambulation and to reduce fall risk. Baseline: EVAL= 0.58 m/s; 05/06/2023= 0.75 m/s without an AD Goal status: PROGRESSING  6.   Patient will increase six minute walk test distance to >1000 for progression to community ambulator and improve  gait ability Baseline: EVAL: to be assessed 2nd visit 10/29: 715 ft; 05/06/2023= 750 feet in 3 min 54 sec without AD Goal status: PROGRESSING    ASSESSMENT:  CLINICAL IMPRESSION:  Patient presents with good motivation despite stomach pain. He tolerates superset with fatigue and need for rest break between sets. Patient tolerates progression of weights to 4lb AW.  Pt will continue to benefit from skilled physical therapy intervention to address impairments, improve QOL, and attain therapy goals      OBJECTIVE IMPAIRMENTS: Abnormal gait, decreased activity tolerance, decreased balance, decreased cognition, decreased coordination, decreased endurance, decreased mobility, difficulty walking, decreased strength, and impaired flexibility.   ACTIVITY LIMITATIONS: carrying, lifting, bending, sitting, standing, squatting, stairs, and transfers  PARTICIPATION LIMITATIONS: cleaning, laundry, shopping, community activity, and yard work  PERSONAL FACTORS: Age and 1-2 comorbidities: COPD, HTN  are also affecting patient's functional outcome.   REHAB POTENTIAL: Good  CLINICAL DECISION MAKING: Evolving/moderate complexity  EVALUATION COMPLEXITY: Moderate  PLAN:  PT FREQUENCY: 1-2x/week  PT DURATION: 12 weeks  PLANNED INTERVENTIONS: 97164- PT Re-evaluation, 97110-Therapeutic exercises, 97530-  Therapeutic activity, [id number]- Neuromuscular re-education, 97535- Self Care, 16109- Manual therapy, [id number]- Gait training, [phone]- Orthotic Fit/training, [phone]- Canalith repositioning, [phone]- Electrical stimulation (manual), Patient/Family education, Balance training, Stair training, Taping, Dry Needling, Joint mobilization, Spinal mobilization, Vestibular training, Visual/preceptual remediation/compensation, DME instructions, Cryotherapy, and Moist heat  PLAN FOR NEXT SESSION:  Continue with balance training, and LE strengthening/conditioning to improve his overall functional endurance.    Precious Bard, PT 06/05/2023, 10:14 AM

## 2023-06-05 ENCOUNTER — Ambulatory Visit
Admission: RE | Admit: 2023-06-05 | Discharge: 2023-06-05 | Disposition: A | Discharge status: Home / Self Care | Payer: No Typology Code available for payment source | Source: Ambulatory Visit | Attending: Interventional Radiology | Admitting: Interventional Radiology

## 2023-06-05 ENCOUNTER — Ambulatory Visit: Payer: No Typology Code available for payment source

## 2023-06-05 DIAGNOSIS — E875 Hyperkalemia: Secondary | ICD-10-CM | POA: Diagnosis not present

## 2023-06-05 DIAGNOSIS — M6281 Muscle weakness (generalized): Secondary | ICD-10-CM

## 2023-06-05 DIAGNOSIS — R269 Unspecified abnormalities of gait and mobility: Secondary | ICD-10-CM

## 2023-06-05 DIAGNOSIS — Z0000 Encounter for general adult medical examination without abnormal findings: Secondary | ICD-10-CM | POA: Diagnosis not present

## 2023-06-05 DIAGNOSIS — R262 Difficulty in walking, not elsewhere classified: Secondary | ICD-10-CM

## 2023-06-05 DIAGNOSIS — N400 Enlarged prostate without lower urinary tract symptoms: Secondary | ICD-10-CM

## 2023-06-05 DIAGNOSIS — I482 Chronic atrial fibrillation, unspecified: Secondary | ICD-10-CM | POA: Diagnosis not present

## 2023-06-05 DIAGNOSIS — N1832 Chronic kidney disease, stage 3b: Secondary | ICD-10-CM | POA: Diagnosis not present

## 2023-06-05 DIAGNOSIS — M1A0710 Idiopathic chronic gout, right ankle and foot, without tophus (tophi): Secondary | ICD-10-CM | POA: Diagnosis not present

## 2023-06-05 DIAGNOSIS — R2681 Unsteadiness on feet: Secondary | ICD-10-CM

## 2023-06-05 DIAGNOSIS — R1031 Right lower quadrant pain: Secondary | ICD-10-CM | POA: Diagnosis not present

## 2023-06-05 DIAGNOSIS — E782 Mixed hyperlipidemia: Secondary | ICD-10-CM | POA: Diagnosis not present

## 2023-06-05 DIAGNOSIS — R1032 Left lower quadrant pain: Secondary | ICD-10-CM | POA: Diagnosis not present

## 2023-06-05 DIAGNOSIS — I10 Essential (primary) hypertension: Secondary | ICD-10-CM | POA: Diagnosis not present

## 2023-06-05 HISTORY — PX: IR RADIOLOGIST EVAL & MGMT: IMG5224

## 2023-06-05 NOTE — Progress Notes (Signed)
 Chief Complaint: Patient was consulted remotely today (TeleHealth) for BPH with LUTS and foley catheter dependence at the request of Cleon Signorelli K.    Referring Physician(s): Antwone Capozzoli K  History of Present Illness: Craig Gilmore is a 89 y.o. male with a history of recent hospitalization for urinary retention this past September.  Prior to this hospitalization, he was having lower urinary tract symptoms including urinary frequency, postvoid dribbling and having to get up multiple times throughout the night to void.  He was initiated on tamsulosin and a Foley catheter was placed.  At the time of discharge, the Foley catheter was removed and he initially did well with some improvement in symptoms.  However, bladder scan on follow-up demonstrates significant postvoid residual (586 mL) necessitating replacement of the Foley catheter.   Due to his age and medical comorbidities, he is not an optimal candidate for surgical treatments of BPH.  Therefore, he Underwent prostate artery embolization on 03/12/2023.  The procedure was successful and without complication.  He was discharged home the same day in good condition.    I spoke with him and his wife today over the telephone for follow-up evaluation.  They were very pleased to report that Craig Gilmore is doing exceptionally well.  As we had hoped, he successfully passed his Foley catheter removal trial and he is now catheter free and urinating on his own!  He and his wife are both extremely pleased with this result and are very happy to be free from the Foley catheter.  He has had no hematuria urinary tract infections or other complicating issues.  No active complaints at this time.  Past Medical History:  Diagnosis Date   Chronic atrial fibrillation (HCC)    a.) CHA2DS2VASc = 3 (age x 2, HTN);  b.) s/p DCCV 08/20/2004 --> 50 J x 1 and 100 J x 1 --> coverted with SB. (+) post-procedure nausea; c.) rate/rhythm maintained without  pharmacological intervention; chronically anticoagulated with warfarin   Colonic polyp    COPD (chronic obstructive pulmonary disease) (HCC)    Depression    Dyspnea    Edema    GERD (gastroesophageal reflux disease)    Gout    Hemorrhoids    Hyperlipidemia    Hypertension    Insomnia    a.) on hypnotic (zolpidem)   Long term current use of anticoagulant    a.) warfarin   OSA on CPAP    Pneumonia    Pulmonary HTN (HCC) 02/14/2016   a.) TTE 02/14/2016: RVSP 37.5 mmHg   Sick sinus syndrome (HCC)    Squamous cell carcinoma in situ 10/03/2009   L medial infrapectoral   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   L nose supratip   Stage 3b chronic kidney disease (CKD) (HCC)     Past Surgical History:  Procedure Laterality Date   CARDIOVERSION N/A 08/21/2004   Procedure: CIRECT CURRENT CARDIOVERSION; Location: [facility]; Surgeon: Harold Hedge, MD   CATARACT EXTRACTION, BILATERAL     CHOLECYSTECTOMY     COLONOSCOPY     IR EMBO TUMOR ORGAN ISCHEMIA INFARCT INC GUIDE ROADMAPPING  03/12/2023   IR RADIOLOGIST EVAL & MGMT  02/25/2023   IR RADIOLOGIST EVAL & MGMT  03/27/2023   IR RADIOLOGIST EVAL & MGMT  06/05/2023   NASAL SINUS SURGERY      Allergies: Baycol [cerivastatin] and Pravastatin  Medications: Prior to Admission medications   Medication Sig Start Date End Date Taking? Authorizing Provider  albuterol (VENTOLIN HFA) 108 (90  Base) MCG/ACT inhaler Inhale 2 puffs into the lungs 2 (two) times daily. 01/27/23 01/27/24  [provider]  amLODipine (NORVASC) 5 MG tablet Take 5 mg by mouth daily.    [provider]  azithromycin (ZITHROMAX) 250 MG tablet Take 250 mg by mouth daily.    [provider]  buPROPion (WELLBUTRIN XL) 150 MG 24 hr tablet Take 75 mg by mouth every morning.    [provider]  Choline Fenofibrate (FENOFIBRIC ACID) 135 MG CPDR Take 1 capsule by mouth  daily Patient taking differently: Take 1 tablet by mouth every evening.  Take 1 capsule by mouth  daily 05/18/15   Iran Ouch, MD  dutasteride (AVODART) 0.5 MG capsule Take 1 capsule (0.5 mg total) by mouth daily. 01/31/23   Vaillancourt, Lelon Mast, PA-C  febuxostat (ULORIC) 40 MG tablet Take 40 mg by mouth every evening.    [provider]  fluticasone (FLONASE) 50 MCG/ACT nasal spray Place 1 spray into both nostrils at bedtime.    [provider]  furosemide (LASIX) 20 MG tablet Take 1 tablet (20 mg total) by mouth daily as needed for fluid or edema (Take 1 tablet for worsening leg swelling, shortness of breath, or weight gain of greater than 3 lbs.). 12/21/22   Marcelino Duster, MD  ipratropium (ATROVENT) 0.03 % nasal spray Place 2 sprays into both nostrils every morning.    [provider]  levocetirizine (XYZAL) 5 MG tablet Take 5 mg by mouth every evening.    [provider]  omeprazole (PRILOSEC OTC) 20 MG tablet Take 20 mg by mouth daily before breakfast.    [provider]  sertraline (ZOLOFT) 100 MG tablet Take 150 mg by mouth every morning.    [provider]  tamsulosin (FLOMAX) 0.4 MG CAPS capsule Take 1 capsule (0.4 mg total) by mouth daily after supper. 04/22/23   Carman Ching, PA-C  warfarin (COUMADIN) 2.5 MG tablet Take 1 tablet (2.5 mg total) by mouth every evening. Take as directed by anticoagulation clinic 12/23/22   Marcelino Duster, MD  zolpidem (AMBIEN) 10 MG tablet Take 10 mg by mouth at bedtime.    [provider]     Family History  Problem Relation Age of Onset   Diabetes Mother    Heart disease Mother    Heart disease Father    Aneurysm Father     Social History   Socioeconomic History   Marital status: Married    Spouse name: Not on file   Number of children: Not on file   Years of education: Not on file   Highest education level: Not on file  Occupational History   Not on file  Tobacco Use   Smoking status: Former    Current packs/day: 0.00     Average packs/day: 1.5 packs/day for 50.0 years (75.0 ttl pk-yrs)    Types: Cigarettes    Start date: 04/09/1939    Quit date: 04/08/1989    Years since quitting: 34.1    Passive exposure: Past   Smokeless tobacco: Never  Vaping Use   Vaping status: Never Used  Substance and Sexual Activity   Alcohol use: No   Drug use: No   Sexual activity: Not on file  Other Topics Concern   Not on file  Social History Narrative   Not on file   Social Drivers of Health   Financial Resource Strain: Patient Declined (05/28/2023)   Received from [facility]   Overall Financial  Resource Strain (CARDIA)    Difficulty of Paying Living Expenses: Patient declined  Food Insecurity: Patient Declined (05/28/2023)   Received from [facility]   Hunger Vital Sign    Worried About Running Out of Food in the Last Year: Patient declined    Ran Out of Food in the Last Year: Patient declined  Transportation Needs: Patient Declined (05/28/2023)   Received from [facility] - Transportation    In the past 12 months, has lack of transportation kept you from medical appointments or from getting medications?: Patient declined    Lack of Transportation (Non-Medical): Patient declined  Physical Activity: Not on file  Stress: Not on file  Social Connections: Not on file    Review of Systems  Review of Systems: A 12 point ROS discussed and pertinent positives are indicated in the HPI above.  All other systems are negative.  Advance Care Plan: The advanced care plan/surrogate decision maker was discussed at the time of visit and the patient did not wish to discuss or was not able to name a surrogate decision maker or provide an advance care plan.    Physical Exam No direct physical exam was performed (except for noted visual exam findings with Video Visits).    Vital Signs: There were no vitals taken for this visit.  Imaging: IR Radiologist Eval &  Mgmt Result Date: 06/05/2023 EXAM: ESTABLISHED PATIENT OFFICE VISIT CHIEF COMPLAINT: SEE NOTE IN EPIC HISTORY OF PRESENT ILLNESS: SEE NOTE IN EPIC REVIEW OF SYSTEMS: SEE NOTE IN EPIC PHYSICAL EXAMINATION: SEE NOTE IN EPIC ASSESSMENT AND PLAN: SEE NOTE IN EPIC Electronically Signed   By: Malachy Moan M.D.   On: 06/05/2023 10:15    Labs:  CBC: Recent Labs    12/18/22 0341 12/19/22 0630 12/20/22 0528 03/12/23 0800  WBC 9.1 14.1* 11.3* 8.7  HGB 13.0 13.8 13.2 14.1  HCT 39.9 41.0 40.2 44.5  PLT 231 260 250 201    COAGS: Recent Labs    12/18/22 0341 12/19/22 0630 12/20/22 0528 03/12/23 0800  INR 3.4* 3.4* 3.6* 1.4*    BMP: Recent Labs    12/18/22 0341 12/19/22 0630 12/20/22 0528 03/12/23 0800  NA 134* 133* 133* 138  K 3.5 3.5 3.7 4.4  CL 105 99 101 103  CO2 19* 19* 20* 28  GLUCOSE 98 105* 99 103*  BUN 33* 35* 37* 41*  CALCIUM 8.5* 9.1 8.7* 9.3  CREATININE 1.61* 1.79* 1.82* 2.16*  GFRNONAA 41* 36* 36* 29*    LIVER FUNCTION TESTS: Recent Labs    12/16/22 1907 12/17/22 0506  BILITOT 1.0 0.9  AST 36 29  ALT 23 18  ALKPHOS 69 53  PROT 7.8 6.4*  ALBUMIN 4.0 3.3*    TUMOR MARKERS: No results for input(s): "AFPTM", "CEA", "CA199", "CHROMGRNA" in the last 8760 hours.  Assessment and Plan:  Pleasant 89 year old gentleman now nearly 3 months status post prostate artery embolization.  He is fully recovered and doing well.  He is also now Foley catheter free and urinating on his own.  He is doing well and could not be happier with his outcome.  At this time, no further scheduled follow-up with interventional radiology.  If his symptoms recur, he knows to call and we will be happy to see him again.     Electronically Signed: Sterling Big 06/05/2023, 12:20 PM   I spent a total of 15 Minutes in remote  clinical consultation, greater than 50% of  which was counseling/coordinating care for BPH with LUTS and foley catheter dependence.    Visit type:  Audio only (telephone). Audio (no video) only due to patient's lack of internet/smartphone capability. Alternative for in-person consultation at [facility], [address] [place], [place], [place]. This visit type was conducted due to national recommendations for restrictions regarding the COVID-19 Pandemic (e.g. social distancing).  This format is felt to be most appropriate for this patient at this time.  All issues noted in this document were discussed and addressed.

## 2023-06-09 ENCOUNTER — Ambulatory Visit: Payer: No Typology Code available for payment source

## 2023-06-10 ENCOUNTER — Ambulatory Visit: Payer: No Typology Code available for payment source

## 2023-06-11 ENCOUNTER — Ambulatory Visit: Payer: No Typology Code available for payment source | Admitting: Physical Therapy

## 2023-06-12 ENCOUNTER — Ambulatory Visit: Payer: No Typology Code available for payment source

## 2023-06-16 ENCOUNTER — Ambulatory Visit: Payer: No Typology Code available for payment source

## 2023-06-16 NOTE — Therapy (Signed)
 OUTPATIENT PHYSICAL THERAPY NEURO TREATMENT  Patient Name: Craig Gilmore MRN: [id number] DOB:[birth year], 89 y.o., male Today's Date: 06/17/2023   PCP: Dr. Jerl Mina   REFERRING PROVIDER: Dr. Jerl Mina  END OF SESSION:  PT End of Session - 06/17/23 0927     Visit Number 19    Number of Visits 24    Date for PT Re-Evaluation 07/29/23    Progress Note Due on Visit 20    PT Start Time 0930    PT Stop Time 1014    PT Time Calculation (min) 44 min    Equipment Utilized During Treatment Gait belt    Activity Tolerance Patient tolerated treatment well    Behavior During Therapy WFL for tasks assessed/performed                              Past Medical History:  Diagnosis Date   Chronic atrial fibrillation (HCC)    a.) CHA2DS2VASc = 3 (age x 2, HTN);  b.) s/p DCCV 08/20/2004 --> 50 J x 1 and 100 J x 1 --> coverted with SB. (+) post-procedure nausea; c.) rate/rhythm maintained without pharmacological intervention; chronically anticoagulated with warfarin   Colonic polyp    COPD (chronic obstructive pulmonary disease) (HCC)    Depression    Dyspnea    Edema    GERD (gastroesophageal reflux disease)    Gout    Hemorrhoids    Hyperlipidemia    Hypertension    Insomnia    a.) on hypnotic (zolpidem)   Long term current use of anticoagulant    a.) warfarin   OSA on CPAP    Pneumonia    Pulmonary HTN (HCC) 02/14/2016   a.) TTE 02/14/2016: RVSP 37.5 mmHg   Sick sinus syndrome (HCC)    Squamous cell carcinoma in situ 10/03/2009   L medial infrapectoral   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   L nose supratip   Stage 3b chronic kidney disease (CKD) (HCC)    Past Surgical History:  Procedure Laterality Date   CARDIOVERSION N/A 08/21/2004   Procedure: CIRECT CURRENT CARDIOVERSION; Location: [facility]; Surgeon: Harold Hedge, MD   CATARACT EXTRACTION, BILATERAL     CHOLECYSTECTOMY     COLONOSCOPY     IR EMBO TUMOR ORGAN ISCHEMIA INFARCT INC GUIDE  ROADMAPPING  03/12/2023   IR RADIOLOGIST EVAL & MGMT  02/25/2023   IR RADIOLOGIST EVAL & MGMT  03/27/2023   IR RADIOLOGIST EVAL & MGMT  06/05/2023   NASAL SINUS SURGERY     Patient Active Problem List   Diagnosis Date Noted   Hyponatremia 12/19/2022   Sick sinus syndrome (HCC) 12/16/2022   Interstitial lung disease (HCC) 12/16/2022   Thoracic aortic aneurysm (HCC) 12/16/2022   Sepsis due to pneumonia (HCC) 12/16/2022   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 tophus 02/07/2022   Stage 3b chronic kidney disease (HCC) 02/07/2022   Hyperlipidemia 05/04/2015   Encounter for therapeutic drug monitoring 11/24/2013   Exertional dyspnea 11/23/2013   Chronic atrial fibrillation (HCC)    Pneumonia, organism unspecified(486) 08/17/2012    ONSET DATE: 2-3  REFERRING DIAG:  R26.81 (ICD-10-CM) - Unsteadiness on feet  R68.89 (ICD-10-CM) - Other general symptoms and signs  Z87.01 (ICD-10-CM) - Personal history of pneumonia (recurrent)    THERAPY DIAG:  Abnormality of gait and mobility  Muscle weakness (generalized)  Unsteadiness on feet  Difficulty in walking, not elsewhere classified  Rationale for Evaluation and Treatment:  Rehabilitation  SUBJECTIVE:                                                                                                                                                                                             SUBJECTIVE STATEMENT: Patient missed last session due to car issues.  Patient has not been compliant with HEP.   Pt accompanied by:  wife- [name]   PERTINENT HISTORY: Patient is a 89 year old male with referall from PCP for imbalance and past medical history includes a recent hospitalization- 12/16/2022- 5 days for sepsis and PNA. PMH includes renal lesion, A-Fib, COPD, Depression, HTN, GOUT, Sleep apnea,  and HLD.  Wife reports he has been having balance issues for 2-3 years but having more unsteadiness recently  PAIN:  Are you having pain?  No  PRECAUTIONS: Fall  RED FLAGS: None   WEIGHT BEARING RESTRICTIONS: No  FALLS: Has patient fallen in last 6 months?  No falls but several near falls.   LIVING ENVIRONMENT: Lives with: lives with their family- wife (son stays during the day)  Lives in: House/apartment Stairs: Yes: External: 3 steps; on right going up Has following equipment at home: Single point cane, Rollator  PLOF: Requires assistive device for independence and Needs assistance with ADLs  PATIENT GOALS: To improve my balance and not fall  OBJECTIVE:  Note: Objective measures were completed at Evaluation unless otherwise noted.  DIAGNOSTIC FINDINGS:  CT SCAN- 12/16/2022 IMPRESSION: 1. No evidence for aortic dissection or aneurysm. 2. Ascending thoracic aortic aneurysm measuring 4.3 x 3.7 cm. Recommend annual imaging followup by CTA or MRA. This recommendation follows 2010 ACCF/AHA/AATS/ACR/ASA/SCA/SCAI/SIR/STS/SVM Guidelines for the Diagnosis and Management of Patients with Thoracic Aortic Disease. Circulation. 2010; 121: K440-N027. Aortic aneurysm NOS (ICD10-I71.9) 3. Pathologic mediastinal and right hilar lymphadenopathy of uncertain etiology. 4. Findings compatible with interstitial lung disease. 5. Ground-glass opacity in the right lower lobe measuring 3.3 cm. This may be infectious/inflammatory, but neoplasm can not be excluded. 6. 4.8 cm indeterminate right renal lesion, possibly complex cysts. Recommend further evaluation with dedicated CT or MRI. 7. Bladder diverticulum. 8. Colonic diverticulosis. 9. Prostatomegaly. 10. Small fat containing left inguinal hernia.     Electronically Signed   By: Darliss Cheney M.D.   On: 12/16/2022 23:30  COGNITION: Overall cognitive status: Impaired- forgetful   SENSATION: WFL  COORDINATION: Delayed at times with BLE  EDEMA:  None observed   POSTURE: rounded shoulders and forward head    LOWER EXTREMITY MMT:    MMT Right Eval Left Eval   Hip flexion 4 4  Hip extension    Hip abduction 4 4  Hip adduction 4 4  Hip internal rotation 4 4  Hip external rotation 4 4  Knee flexion 4 4  Knee extension 4 4  Ankle dorsiflexion 4 4  Ankle plantarflexion    Ankle inversion    Ankle eversion    (Blank rows = not tested)   TRANSFERS: Assistive device utilized: Single point cane  Sit to stand: Modified independence Stand to sit: Modified independence Chair to chair: Modified independence Floor:  Not tested   GAIT: Gait pattern: decreased arm swing- Right, decreased arm swing- Left, decreased step length- Right, decreased step length- Left, decreased stance time- Right, decreased stance time- Left, and decreased stride length Distance walked: > 200 feet in total Assistive device utilized: Single point cane Level of assistance: CGA Comments: decreased gait speed and VC to pick up feet  FUNCTIONAL TESTS:  5 times sit to stand: 22.68 sec withuout  Timed up and go (TUG): 13.99 sec with cane 6 minute walk test: To be performed next visit 10 meter walk test: 0.59 m/s Berg Balance Scale: 45/56  PATIENT SURVEYS:  FOTO 54  TODAY'S TREATMENT:                                                                                                                              DATE: 06/17/23   BP : 128/73   mmHg R UE  THERAPEUTIC ACTIVITIES: To improve functional movements patterns for everyday tasks   6" step:  -step up/down 10x each LE -lateral step up/down 10x each LE  Activity Description: one pod per stair, up/down stair Activity Setting:  The Blaze Pod Random setting was chosen to enhance cognitive processing and agility, providing an unpredictable environment to simulate real-world scenarios, and fostering quick reactions and adaptability.   Number of Pods:  6 Cycles/Sets:  3 Duration (Time or Hit Count):  10  Orange hurdle: -step over and back 15x each LE -lateral step over/back 15x each side   Standing row with GTB  15x   TE- To improve strength, endurance, mobility, and function of specific targeted muscle groups or improve joint range of motion or improve muscle flexibility Seated hip march 4# AW- 15x Seated LAQ 4# AW-15x  Seated heel raise 4# 15x  4lb band side step over band 10x each side      PATIENT EDUCATION: Education details: PT plan of care; goals Person educated: Patient Education method: Explanation Education comprehension: verbalized understanding  HOME EXERCISE PROGRAM:  Access Code: [email or web address] URL: [email or web address] Date: 03/20/2023 Prepared by: Maureen Ralphs  Exercises - Scapular Retraction with Resistance  - 1 x daily - 3 sets - 10 reps - Shoulder extension with resistance - Neutral  - 1 x daily - 7 x weekly - 3 sets - 10 reps   Access Code: [email or web address] URL: [email or web address] Date: 02/04/2023 Prepared by: Precious Bard  Exercises - Seated March  - 1 x daily - 7 x weekly - 2 sets - 10 reps -  5 hold - Leg Extension  - 1 x daily - 7 x weekly - 2 sets - 10 reps - 3 hold - Seated Hip Abduction with Resistance  - 1 x daily - 7 x weekly - 2 sets - 10 reps - 5 hold  GOALS: Goals reviewed with patient? Yes  SHORT TERM GOALS: Target date: 03/13/2023  Pt will be independent with HEP in order to improve strength and balance in order to decrease fall risk and improve function at home and work.  Baseline: EVAL: No formal HEP in place; 05/06/2023= Patient reports knowledgeable of HEP but states has not been very compliant.  Goal status: Partially Met    LONG TERM GOALS: Target date: 07/29/23  1.  Patient (> 47 years old) will complete five times sit to stand test in < 15 seconds indicating an increased LE strength and improved balance. Baseline: EVAL: 22.68 without UE support; 05/06/2023= 16.79 sec without UE support Goal status: PROGRESSING  2.  Patient will increase FOTO score to equal to or greater than  55   to demonstrate statistically  significant improvement in mobility and quality of life.  Baseline: EVAL=54; 05/06/2023=62 Goal status: MET   3.  Patient will increase Berg Balance score by > 4 points to demonstrate decreased fall risk during functional activities. Baseline: 45; 05/06/2023= 47/56 Goal status: PROGRESSING   4.   Patient will reduce timed up and go to <11 seconds to reduce fall risk and demonstrate improved transfer/gait ability. Baseline: EVAL=13.99 sec with cane; 05/06/2023= 13.01 sec without a cane.  Goal status: PROGRESSING  5.   Patient will increase 10 meter walk test to >1.66m/s as to improve gait speed for better community ambulation and to reduce fall risk. Baseline: EVAL= 0.58 m/s; 05/06/2023= 0.75 m/s without an AD Goal status: PROGRESSING  6.   Patient will increase six minute walk test distance to >1000 for progression to community ambulator and improve gait ability Baseline: EVAL: to be assessed 2nd visit 10/29: 715 ft; 05/06/2023= 750 feet in 3 min 54 sec without AD Goal status: PROGRESSING    ASSESSMENT:  CLINICAL IMPRESSION:  Patient tolerates stair negotiation interventions with occasional rest breaks. Cues for foot placement required. Patient has extensive trunk flexion with challenging exercises that requires spatial awareness.  Pt will continue to benefit from skilled physical therapy intervention to address impairments, improve QOL, and attain therapy goals      OBJECTIVE IMPAIRMENTS: Abnormal gait, decreased activity tolerance, decreased balance, decreased cognition, decreased coordination, decreased endurance, decreased mobility, difficulty walking, decreased strength, and impaired flexibility.   ACTIVITY LIMITATIONS: carrying, lifting, bending, sitting, standing, squatting, stairs, and transfers  PARTICIPATION LIMITATIONS: cleaning, laundry, shopping, community activity, and yard work  PERSONAL FACTORS: Age and 1-2 comorbidities: COPD, HTN  are also affecting patient's  functional outcome.   REHAB POTENTIAL: Good  CLINICAL DECISION MAKING: Evolving/moderate complexity  EVALUATION COMPLEXITY: Moderate  PLAN:  PT FREQUENCY: 1-2x/week  PT DURATION: 12 weeks  PLANNED INTERVENTIONS: 97164- PT Re-evaluation, 97110-Therapeutic exercises, 97530- Therapeutic activity, [id number]- Neuromuscular re-education, 97535- Self Care, 16109- Manual therapy, [id number]- Gait training, [id number]- Orthotic Fit/training, [phone]- Canalith repositioning, [id number]- Electrical stimulation (manual), Patient/Family education, Balance training, Stair training, Taping, Dry Needling, Joint mobilization, Spinal mobilization, Vestibular training, Visual/preceptual remediation/compensation, DME instructions, Cryotherapy, and Moist heat  PLAN FOR NEXT SESSION:  Continue with balance training, and LE strengthening/conditioning to improve his overall functional endurance.    Precious Bard, PT 06/17/2023, 10:14 AM

## 2023-06-17 ENCOUNTER — Ambulatory Visit: Payer: No Typology Code available for payment source | Attending: Family Medicine

## 2023-06-17 DIAGNOSIS — I482 Chronic atrial fibrillation, unspecified: Secondary | ICD-10-CM | POA: Diagnosis not present

## 2023-06-17 DIAGNOSIS — D126 Benign neoplasm of colon, unspecified: Secondary | ICD-10-CM | POA: Diagnosis not present

## 2023-06-17 DIAGNOSIS — R262 Difficulty in walking, not elsewhere classified: Secondary | ICD-10-CM | POA: Insufficient documentation

## 2023-06-17 DIAGNOSIS — R269 Unspecified abnormalities of gait and mobility: Secondary | ICD-10-CM | POA: Insufficient documentation

## 2023-06-17 DIAGNOSIS — R140 Abdominal distension (gaseous): Secondary | ICD-10-CM | POA: Diagnosis not present

## 2023-06-17 DIAGNOSIS — R1032 Left lower quadrant pain: Secondary | ICD-10-CM | POA: Diagnosis not present

## 2023-06-17 DIAGNOSIS — Z7901 Long term (current) use of anticoagulants: Secondary | ICD-10-CM | POA: Diagnosis not present

## 2023-06-17 DIAGNOSIS — R1031 Right lower quadrant pain: Secondary | ICD-10-CM | POA: Diagnosis not present

## 2023-06-17 DIAGNOSIS — M6281 Muscle weakness (generalized): Secondary | ICD-10-CM | POA: Insufficient documentation

## 2023-06-17 DIAGNOSIS — K5900 Constipation, unspecified: Secondary | ICD-10-CM | POA: Diagnosis not present

## 2023-06-17 DIAGNOSIS — R2681 Unsteadiness on feet: Secondary | ICD-10-CM | POA: Diagnosis present

## 2023-06-18 ENCOUNTER — Ambulatory Visit: Payer: No Typology Code available for payment source | Admitting: Physical Therapy

## 2023-06-19 ENCOUNTER — Ambulatory Visit: Payer: No Typology Code available for payment source

## 2023-06-23 ENCOUNTER — Ambulatory Visit: Payer: No Typology Code available for payment source

## 2023-06-24 ENCOUNTER — Ambulatory Visit: Payer: No Typology Code available for payment source

## 2023-06-24 ENCOUNTER — Telehealth: Payer: Self-pay

## 2023-06-24 NOTE — Telephone Encounter (Signed)
 Patient no showed appt time. Left voicemail of time and date of next appt.   Precious Bard, PT, DPT Physical Therapist - [facility]  Outpatient Physical Therapy- Main Campus [phone]

## 2023-06-24 NOTE — Telephone Encounter (Signed)
 Note opened in error due to patient no-show at appointment.   Precious Bard, PT, DPT Physical Therapist - [place] [facility]  Outpatient Physical Therapy- Main Campus [phone]

## 2023-06-25 ENCOUNTER — Ambulatory Visit: Payer: No Typology Code available for payment source | Admitting: Physical Therapy

## 2023-06-25 NOTE — Therapy (Signed)
 OUTPATIENT PHYSICAL THERAPY NEURO TREATMENT/ Physical Therapy Progress Note   Dates of reporting period  03/31/23   to   06/26/23    Patient Name: Craig Gilmore MRN: [id number] DOB:1934-09-23, 89 y.o., male Today's Date: 06/26/2023   PCP: Dr. Jerl Mina   REFERRING PROVIDER: Dr. Jerl Mina  END OF SESSION:  PT End of Session - 06/26/23 1146     Visit Number 20    Number of Visits 24    Date for PT Re-Evaluation 07/29/23    Progress Note Due on Visit 20    PT Start Time 1145    PT Stop Time 1229    PT Time Calculation (min) 44 min    Equipment Utilized During Treatment Gait belt    Activity Tolerance Patient tolerated treatment well    Behavior During Therapy WFL for tasks assessed/performed                               Past Medical History:  Diagnosis Date   Chronic atrial fibrillation (HCC)    a.) CHA2DS2VASc = 3 (age x 2, HTN);  b.) s/p DCCV 08/20/2004 --> 50 J x 1 and 100 J x 1 --> coverted with SB. (+) post-procedure nausea; c.) rate/rhythm maintained without pharmacological intervention; chronically anticoagulated with warfarin   Colonic polyp    COPD (chronic obstructive pulmonary disease) (HCC)    Depression    Dyspnea    Edema    GERD (gastroesophageal reflux disease)    Gout    Hemorrhoids    Hyperlipidemia    Hypertension    Insomnia    a.) on hypnotic (zolpidem)   Long term current use of anticoagulant    a.) warfarin   OSA on CPAP    Pneumonia    Pulmonary HTN (HCC) 02/14/2016   a.) TTE 02/14/2016: RVSP 37.5 mmHg   Sick sinus syndrome (HCC)    Squamous cell carcinoma in situ 10/03/2009   L medial infrapectoral   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   L nose supratip   Stage 3b chronic kidney disease (CKD) (HCC)    Past Surgical History:  Procedure Laterality Date   CARDIOVERSION N/A 08/21/2004   Procedure: CIRECT CURRENT CARDIOVERSION; Location: [facility]; Surgeon: Harold Hedge, MD   CATARACT EXTRACTION,  BILATERAL     CHOLECYSTECTOMY     COLONOSCOPY     IR EMBO TUMOR ORGAN ISCHEMIA INFARCT INC GUIDE ROADMAPPING  03/12/2023   IR RADIOLOGIST EVAL & MGMT  02/25/2023   IR RADIOLOGIST EVAL & MGMT  03/27/2023   IR RADIOLOGIST EVAL & MGMT  06/05/2023   NASAL SINUS SURGERY     Patient Active Problem List   Diagnosis Date Noted   Hyponatremia 12/19/2022   Sick sinus syndrome (HCC) 12/16/2022   Interstitial lung disease (HCC) 12/16/2022   Thoracic aortic aneurysm (HCC) 12/16/2022   Sepsis due to pneumonia (HCC) 12/16/2022   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 tophus 02/07/2022   Stage 3b chronic kidney disease (HCC) 02/07/2022   Hyperlipidemia 05/04/2015   Encounter for therapeutic drug monitoring 11/24/2013   Exertional dyspnea 11/23/2013   Chronic atrial fibrillation (HCC)    Pneumonia, organism unspecified(486) 08/17/2012    ONSET DATE: 2-3  REFERRING DIAG:  R26.81 (ICD-10-CM) - Unsteadiness on feet  R68.89 (ICD-10-CM) - Other general symptoms and signs  Z87.01 (ICD-10-CM) - Personal history of pneumonia (recurrent)    THERAPY DIAG:  Abnormality of gait and mobility  Muscle weakness (generalized)  Unsteadiness on feet  Difficulty in walking, not elsewhere classified  Rationale for Evaluation and Treatment: Rehabilitation  SUBJECTIVE:                                                                                                                                                                                             SUBJECTIVE STATEMENT: Patient missed last two sessions.   Pt accompanied by:  wife- [name]   PERTINENT HISTORY: Patient is a 89 year old male with referall from PCP for imbalance and past medical history includes a recent hospitalization- 12/16/2022- 5 days for sepsis and PNA. PMH includes renal lesion, A-Fib, COPD, Depression, HTN, GOUT, Sleep apnea,  and HLD.  Wife reports he has been having balance issues for 2-3 years but having more unsteadiness  recently  PAIN:  Are you having pain? No  PRECAUTIONS: Fall  RED FLAGS: None   WEIGHT BEARING RESTRICTIONS: No  FALLS: Has patient fallen in last 6 months?  No falls but several near falls.   LIVING ENVIRONMENT: Lives with: lives with their family- wife (son stays during the day)  Lives in: House/apartment Stairs: Yes: External: 3 steps; on right going up Has following equipment at home: Single point cane, Rollator  PLOF: Requires assistive device for independence and Needs assistance with ADLs  PATIENT GOALS: To improve my balance and not fall  OBJECTIVE:  Note: Objective measures were completed at Evaluation unless otherwise noted.  DIAGNOSTIC FINDINGS:  CT SCAN- 12/16/2022 IMPRESSION: 1. No evidence for aortic dissection or aneurysm. 2. Ascending thoracic aortic aneurysm measuring 4.3 x 3.7 cm. Recommend annual imaging followup by CTA or MRA. This recommendation follows 2010 ACCF/AHA/AATS/ACR/ASA/SCA/SCAI/SIR/STS/SVM Guidelines for the Diagnosis and Management of Patients with Thoracic Aortic Disease. Circulation. 2010; 121: B284-X324. Aortic aneurysm NOS (ICD10-I71.9) 3. Pathologic mediastinal and right hilar lymphadenopathy of uncertain etiology. 4. Findings compatible with interstitial lung disease. 5. Ground-glass opacity in the right lower lobe measuring 3.3 cm. This may be infectious/inflammatory, but neoplasm can not be excluded. 6. 4.8 cm indeterminate right renal lesion, possibly complex cysts. Recommend further evaluation with dedicated CT or MRI. 7. Bladder diverticulum. 8. Colonic diverticulosis. 9. Prostatomegaly. 10. Small fat containing left inguinal hernia.     Electronically Signed   By: Darliss Cheney M.D.   On: 12/16/2022 23:30  COGNITION: Overall cognitive status: Impaired- forgetful   SENSATION: WFL  COORDINATION: Delayed at times with BLE  EDEMA:  None observed   POSTURE: rounded shoulders and forward head    LOWER  EXTREMITY MMT:    MMT Right Eval Left Eval  Hip flexion 4 4  Hip extension    Hip abduction 4 4  Hip adduction 4 4  Hip internal rotation 4 4  Hip external rotation 4 4  Knee flexion 4 4  Knee extension 4 4  Ankle dorsiflexion 4 4  Ankle plantarflexion    Ankle inversion    Ankle eversion    (Blank rows = not tested)   TRANSFERS: Assistive device utilized: Single point cane  Sit to stand: Modified independence Stand to sit: Modified independence Chair to chair: Modified independence Floor:  Not tested   GAIT: Gait pattern: decreased arm swing- Right, decreased arm swing- Left, decreased step length- Right, decreased step length- Left, decreased stance time- Right, decreased stance time- Left, and decreased stride length Distance walked: > 200 feet in total Assistive device utilized: Single point cane Level of assistance: CGA Comments: decreased gait speed and VC to pick up feet  FUNCTIONAL TESTS:  5 times sit to stand: 22.68 sec withuout  Timed up and go (TUG): 13.99 sec with cane 6 minute walk test: To be performed next visit 10 meter walk test: 0.59 m/s Berg Balance Scale: 45/56  PATIENT SURVEYS:  FOTO 54  TODAY'S TREATMENT:                                                                                                                              DATE: 06/26/23 Physical therapy treatment session today consisted of completing assessment of goals and administration of testing as demonstrated and documented in flow sheet, treatment, and goals section of this note. Addition treatments may be found below.    [facility] PT Assessment - 06/26/23 0001       Berg Balance Test   Sit to Stand Able to stand without using hands and stabilize independently    Standing Unsupported Able to stand safely 2 minutes    Sitting with Back Unsupported but Feet Supported on Floor or Stool Able to sit safely and securely 2 minutes    Stand to Sit Sits safely with minimal use of hands     Transfers Able to transfer safely, minor use of hands    Standing Unsupported with Eyes Closed Able to stand 10 seconds safely    Standing Unsupported with Feet Together Able to place feet together independently and stand 1 minute safely    From Standing, Reach Forward with Outstretched Arm Can reach confidently >25 cm (10")    From Standing Position, Pick up Object from Floor Able to pick up shoe safely and easily    From Standing Position, Turn to Look Behind Over each Shoulder Looks behind one side only/other side shows less weight shift    Turn 360 Degrees Able to turn 360 degrees safely but slowly    Standing Unsupported, Alternately Place Feet on Step/Stool Able to stand independently and complete 8 steps >20 seconds    Standing Unsupported, One Foot in Front Able to take small step independently and hold 30 seconds  Standing on One Leg Able to lift leg independently and hold equal to or more than 3 seconds    Total Score 48             10 Meter Walk Test: Patient instructed to walk 10 meters (32.8 ft) as quickly and as safely as possible at their normal speed x2 and at a fast speed x2. Time measured from 2 meter mark to 8 meter mark to accommodate ramp-up and ramp-down.   Average Fast speed: 1.2 m/s Cut off scores: <0.4 m/s = household Ambulator, 0.4-0.8 m/s = limited community Ambulator, >0.8 m/s = community Ambulator, >1.2 m/s = crossing a street, <1.0 = increased fall risk MCID 0.05 m/s (small), 0.13 m/s (moderate), 0.06 m/s (significant)  (ANPTA Core Set of Outcome Measures for Adults with Neurologic Conditions, 2018)   PT instructed pt in TUG: 11.71  sec (average of 3 trials; >13.5 sec indicates increased fall risk)  6 Min Walk Test:  Instructed patient to ambulate as quickly and as safely as possible for 6 minutes using LRAD. Patient was allowed to take standing rest breaks without stopping the test, but if the patient required a sitting rest break the clock would be  stopped and the test would be over.  Results: 925 feet ) using a SPC with CGA. Results indicate that the patient has reduced endurance with ambulation compared to age matched norms.  Age Matched Norms: 14-69 yo M: 30 F: 70, 51-79 yo M: 17 F: 471, 53-89 yo M: 417 F: 392 MDC: 58.21 meters (190.98 feet) or 50 meters (ANPTA Core Set of Outcome Measures for Adults with Neurologic Conditions, 2018)  BP after: 164/87 BP 10  minutes after: 155/98        PATIENT EDUCATION: Education details: PT plan of care; goals Person educated: Patient Education method: Explanation Education comprehension: verbalized understanding  HOME EXERCISE PROGRAM:  Access Code: [email or web address] URL: [email or web address] Date: 03/20/2023 Prepared by: Maureen Ralphs  Exercises - Scapular Retraction with Resistance  - 1 x daily - 3 sets - 10 reps - Shoulder extension with resistance - Neutral  - 1 x daily - 7 x weekly - 3 sets - 10 reps   Access Code: [email or web address] URL: [email or web address] Date: 02/04/2023 Prepared by: Precious Bard  Exercises - Seated March  - 1 x daily - 7 x weekly - 2 sets - 10 reps - 5 hold - Leg Extension  - 1 x daily - 7 x weekly - 2 sets - 10 reps - 3 hold - Seated Hip Abduction with Resistance  - 1 x daily - 7 x weekly - 2 sets - 10 reps - 5 hold  GOALS: Goals reviewed with patient? Yes  SHORT TERM GOALS: Target date: 03/13/2023  Pt will be independent with HEP in order to improve strength and balance in order to decrease fall risk and improve function at home and work.  Baseline: EVAL: No formal HEP in place; 05/06/2023= Patient reports knowledgeable of HEP but states has not been very compliant.  Goal status: Partially Met    LONG TERM GOALS: Target date: 07/29/23  1.  Patient (> 42 years old) will complete five times sit to stand test in < 15 seconds indicating an increased LE strength and improved balance. Baseline: EVAL: 22.68 without UE support;  05/06/2023= 16.79 sec without UE support 3/20: 11 seconds no hands Goal status: MET  2.  Patient will increase FOTO score to equal to or greater than  55   to  demonstrate statistically significant improvement in mobility and quality of life.  Baseline: EVAL=54; 05/06/2023=62 Goal status: MET   3.  Patient will increase Berg Balance score by > 4 points to demonstrate decreased fall risk during functional activities. Baseline: 45; 05/06/2023= 47/56 3/20: 48/56  Goal status: PROGRESSING   4.   Patient will reduce timed up and go to <11 seconds to reduce fall risk and demonstrate improved transfer/gait ability. Baseline: EVAL=13.99 sec with cane; 05/06/2023= 13.01 sec without a cane. 3/20: 11.71 seconds no AD  Goal status: PROGRESSING  5.   Patient will increase 10 meter walk test to >1.60m/s as to improve gait speed for better community ambulation and to reduce fall risk. Baseline: EVAL= 0.58 m/s; 05/06/2023= 0.75 m/s without an AD 3/20: 1.2 m/s no AD  Goal status: MET  6.   Patient will increase six minute walk test distance to >1000 for progression to community ambulator and improve gait ability Baseline: EVAL: to be assessed 2nd visit 10/29: 715 ft; 05/06/2023= 750 feet in 3 min 54 sec without AD 3/20: 925 ft with SPC  Goal status: PROGRESSING    ASSESSMENT:  CLINICAL IMPRESSION:  Patient's condition has the potential to improve in response to therapy. Maximum improvement is yet to be obtained. The anticipated improvement is attainable and reasonable in a generally predictable time.  Patient has met his 10 MWT and 5x STS goals and is progressing with TUG and BERG. He is highly motivated throughout session and eager to progress his functional mobility. Pt will continue to benefit from skilled physical therapy intervention to address impairments, improve QOL, and attain therapy goals      OBJECTIVE IMPAIRMENTS: Abnormal gait, decreased activity tolerance, decreased balance, decreased  cognition, decreased coordination, decreased endurance, decreased mobility, difficulty walking, decreased strength, and impaired flexibility.   ACTIVITY LIMITATIONS: carrying, lifting, bending, sitting, standing, squatting, stairs, and transfers  PARTICIPATION LIMITATIONS: cleaning, laundry, shopping, community activity, and yard work  PERSONAL FACTORS: Age and 1-2 comorbidities: COPD, HTN  are also affecting patient's functional outcome.   REHAB POTENTIAL: Good  CLINICAL DECISION MAKING: Evolving/moderate complexity  EVALUATION COMPLEXITY: Moderate  PLAN:  PT FREQUENCY: 1-2x/week  PT DURATION: 12 weeks  PLANNED INTERVENTIONS: 97164- PT Re-evaluation, 97110-Therapeutic exercises, 97530- Therapeutic activity, [id number]- Neuromuscular re-education, 97535- Self Care, 16109- Manual therapy, [id number]- Gait training, [phone]- Orthotic Fit/training, [phone]- Canalith repositioning, [phone]- Electrical stimulation (manual), Patient/Family education, Balance training, Stair training, Taping, Dry Needling, Joint mobilization, Spinal mobilization, Vestibular training, Visual/preceptual remediation/compensation, DME instructions, Cryotherapy, and Moist heat  PLAN FOR NEXT SESSION:  Continue with balance training, and LE strengthening/conditioning to improve his overall functional endurance.    Precious Bard, PT 06/26/2023, 12:53 PM

## 2023-06-26 ENCOUNTER — Ambulatory Visit: Payer: No Typology Code available for payment source

## 2023-06-26 DIAGNOSIS — R269 Unspecified abnormalities of gait and mobility: Secondary | ICD-10-CM

## 2023-06-26 DIAGNOSIS — M6281 Muscle weakness (generalized): Secondary | ICD-10-CM

## 2023-06-26 DIAGNOSIS — R2681 Unsteadiness on feet: Secondary | ICD-10-CM

## 2023-06-26 DIAGNOSIS — R262 Difficulty in walking, not elsewhere classified: Secondary | ICD-10-CM

## 2023-06-30 ENCOUNTER — Ambulatory Visit: Payer: No Typology Code available for payment source

## 2023-06-30 NOTE — Therapy (Signed)
 OUTPATIENT PHYSICAL THERAPY NEURO TREATMENT   Patient Name: Craig Gilmore MRN: [id number] DOB:1934/09/05, 89 y.o., male Today's Date: 07/01/2023   PCP: Dr. Jerl Mina   REFERRING PROVIDER: Dr. Jerl Mina  END OF SESSION:  PT End of Session - 07/01/23 1149     Visit Number 21    Number of Visits 24    Date for PT Re-Evaluation 07/29/23    Progress Note Due on Visit 20    PT Start Time 1148    PT Stop Time 1229    PT Time Calculation (min) 41 min    Equipment Utilized During Treatment Gait belt    Activity Tolerance Patient tolerated treatment well    Behavior During Therapy WFL for tasks assessed/performed                                Past Medical History:  Diagnosis Date   Chronic atrial fibrillation (HCC)    a.) CHA2DS2VASc = 3 (age x 2, HTN);  b.) s/p DCCV 08/20/2004 --> 50 J x 1 and 100 J x 1 --> coverted with SB. (+) post-procedure nausea; c.) rate/rhythm maintained without pharmacological intervention; chronically anticoagulated with warfarin   Colonic polyp    COPD (chronic obstructive pulmonary disease) (HCC)    Depression    Dyspnea    Edema    GERD (gastroesophageal reflux disease)    Gout    Hemorrhoids    Hyperlipidemia    Hypertension    Insomnia    a.) on hypnotic (zolpidem)   Long term current use of anticoagulant    a.) warfarin   OSA on CPAP    Pneumonia    Pulmonary HTN (HCC) 02/14/2016   a.) TTE 02/14/2016: RVSP 37.5 mmHg   Sick sinus syndrome (HCC)    Squamous cell carcinoma in situ 10/03/2009   L medial infrapectoral   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   L nose supratip   Stage 3b chronic kidney disease (CKD) (HCC)    Past Surgical History:  Procedure Laterality Date   CARDIOVERSION N/A 08/21/2004   Procedure: CIRECT CURRENT CARDIOVERSION; Location: [facility]; Surgeon: Harold Hedge, MD   CATARACT EXTRACTION, BILATERAL     CHOLECYSTECTOMY     COLONOSCOPY     IR EMBO TUMOR ORGAN [employer]  GUIDE ROADMAPPING  03/12/2023   IR RADIOLOGIST EVAL & MGMT  02/25/2023   IR RADIOLOGIST EVAL & MGMT  03/27/2023   IR RADIOLOGIST EVAL & MGMT  06/05/2023   NASAL SINUS SURGERY     Patient Active Problem List   Diagnosis Date Noted   Hyponatremia 12/19/2022   Sick sinus syndrome (HCC) 12/16/2022   Interstitial lung disease (HCC) 12/16/2022   Thoracic aortic aneurysm (HCC) 12/16/2022   Sepsis due to pneumonia (HCC) 12/16/2022   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 tophus 02/07/2022   Stage 3b chronic kidney disease (HCC) 02/07/2022   Hyperlipidemia 05/04/2015   Encounter for therapeutic drug monitoring 11/24/2013   Exertional dyspnea 11/23/2013   Chronic atrial fibrillation (HCC)    Pneumonia, organism unspecified(486) 08/17/2012    ONSET DATE: 2-3  REFERRING DIAG:  R26.81 (ICD-10-CM) - Unsteadiness on feet  R68.89 (ICD-10-CM) - Other general symptoms and signs  Z87.01 (ICD-10-CM) - Personal history of pneumonia (recurrent)    THERAPY DIAG:  Abnormality of gait and mobility  Muscle weakness (generalized)  Unsteadiness on feet  Difficulty in walking, not elsewhere classified  Rationale for  Evaluation and Treatment: Rehabilitation  SUBJECTIVE:                                                                                                                                                                                             SUBJECTIVE STATEMENT: Patient compliant with HEP.   Pt accompanied by:  wife- [name]   PERTINENT HISTORY: Patient is a 89 year old male with referall from PCP for imbalance and past medical history includes a recent hospitalization- 12/16/2022- 5 days for sepsis and PNA. PMH includes renal lesion, A-Fib, COPD, Depression, HTN, GOUT, Sleep apnea,  and HLD.  Wife reports he has been having balance issues for 2-3 years but having more unsteadiness recently  PAIN:  Are you having pain? No  PRECAUTIONS: Fall  RED FLAGS: None   WEIGHT BEARING  RESTRICTIONS: No  FALLS: Has patient fallen in last 6 months?  No falls but several near falls.   LIVING ENVIRONMENT: Lives with: lives with their family- wife (son stays during the day)  Lives in: House/apartment Stairs: Yes: External: 3 steps; on right going up Has following equipment at home: Single point cane, Rollator  PLOF: Requires assistive device for independence and Needs assistance with ADLs  PATIENT GOALS: To improve my balance and not fall  OBJECTIVE:  Note: Objective measures were completed at Evaluation unless otherwise noted.  DIAGNOSTIC FINDINGS:  CT SCAN- 12/16/2022 IMPRESSION: 1. No evidence for aortic dissection or aneurysm. 2. Ascending thoracic aortic aneurysm measuring 4.3 x 3.7 cm. Recommend annual imaging followup by CTA or MRA. This recommendation follows 2010 ACCF/AHA/AATS/ACR/ASA/SCA/SCAI/SIR/STS/SVM Guidelines for the Diagnosis and Management of Patients with Thoracic Aortic Disease. Circulation. 2010; 121: W098-J191. Aortic aneurysm NOS (ICD10-I71.9) 3. Pathologic mediastinal and right hilar lymphadenopathy of uncertain etiology. 4. Findings compatible with interstitial lung disease. 5. Ground-glass opacity in the right lower lobe measuring 3.3 cm. This may be infectious/inflammatory, but neoplasm can not be excluded. 6. 4.8 cm indeterminate right renal lesion, possibly complex cysts. Recommend further evaluation with dedicated CT or MRI. 7. Bladder diverticulum. 8. Colonic diverticulosis. 9. Prostatomegaly. 10. Small fat containing left inguinal hernia.     Electronically Signed   By: Darliss Cheney M.D.   On: 12/16/2022 23:30  COGNITION: Overall cognitive status: Impaired- forgetful   SENSATION: WFL  COORDINATION: Delayed at times with BLE  EDEMA:  None observed   POSTURE: rounded shoulders and forward head    LOWER EXTREMITY MMT:    MMT Right Eval Left Eval  Hip flexion 4 4  Hip extension    Hip abduction 4 4  Hip  adduction 4 4  Hip internal rotation  4 4  Hip external rotation 4 4  Knee flexion 4 4  Knee extension 4 4  Ankle dorsiflexion 4 4  Ankle plantarflexion    Ankle inversion    Ankle eversion    (Blank rows = not tested)   TRANSFERS: Assistive device utilized: Single point cane  Sit to stand: Modified independence Stand to sit: Modified independence Chair to chair: Modified independence Floor:  Not tested   GAIT: Gait pattern: decreased arm swing- Right, decreased arm swing- Left, decreased step length- Right, decreased step length- Left, decreased stance time- Right, decreased stance time- Left, and decreased stride length Distance walked: > 200 feet in total Assistive device utilized: Single point cane Level of assistance: CGA Comments: decreased gait speed and VC to pick up feet  FUNCTIONAL TESTS:  5 times sit to stand: 22.68 sec withuout  Timed up and go (TUG): 13.99 sec with cane 6 minute walk test: To be performed next visit 10 meter walk test: 0.59 m/s Berg Balance Scale: 45/56  PATIENT SURVEYS:  FOTO 54  TODAY'S TREATMENT:                                                                                                                              DATE: 07/01/23   BP : 142/89   mmHg R UE   THERAPEUTIC ACTIVITIES: To improve functional movements patterns for everyday tasks   Orange hurdle:  -step over one leg 10x; decreasing UE support -lateral step 10x each side   6" step:  -step up/down 10x each LE -lateral step up/down 10x each LE   4lb ankle weight:  -marching 15x each LE standing  -side steps 10x each side standing;  Seated LAQ 4# AW-15x  Seated GTB hamstring curl 15x each LE     Neuro Re-ed: Standing with CGA next to support surface:  Airex pad: static stand 30 seconds x 2 trials, noticeable trembling of ankles/LE's with fatigue and challenge to maintain stability Airex pad: horizontal head turns  scanning room 10x ; cueing for arc of motion   Airex pad: vertical head turns 30 seconds, cueing for arc of motion, noticeable sway with upward gaze increasing demand on ankle righting reaction musculature Airex pad: one foot on 6" step one foot on airex pad, hold position for 30 seconds, switch legs, 2x each LE;     PATIENT EDUCATION: Education details: PT plan of care; goals Person educated: Patient Education method: Explanation Education comprehension: verbalized understanding  HOME EXERCISE PROGRAM:  Access Code: [email or web address] URL: [email or web address] Date: 03/20/2023 Prepared by: Maureen Ralphs  Exercises - Scapular Retraction with Resistance  - 1 x daily - 3 sets - 10 reps - Shoulder extension with resistance - Neutral  - 1 x daily - 7 x weekly - 3 sets - 10 reps   Access Code: [email or web address] URL: [email or web address] Date: 02/04/2023 Prepared by: Precious Bard  Exercises - Seated March  - 1 x daily - 7 x weekly -  2 sets - 10 reps - 5 hold - Leg Extension  - 1 x daily - 7 x weekly - 2 sets - 10 reps - 3 hold - Seated Hip Abduction with Resistance  - 1 x daily - 7 x weekly - 2 sets - 10 reps - 5 hold  GOALS: Goals reviewed with patient? Yes  SHORT TERM GOALS: Target date: 03/13/2023  Pt will be independent with HEP in order to improve strength and balance in order to decrease fall risk and improve function at home and work.  Baseline: EVAL: No formal HEP in place; 05/06/2023= Patient reports knowledgeable of HEP but states has not been very compliant.  Goal status: Partially Met    LONG TERM GOALS: Target date: 07/29/23  1.  Patient (> 8 years old) will complete five times sit to stand test in < 15 seconds indicating an increased LE strength and improved balance. Baseline: EVAL: 22.68 without UE support; 05/06/2023= 16.79 sec without UE support 3/20: 11 seconds no hands Goal status: MET  2.  Patient will increase FOTO score to equal to or greater than  55   to demonstrate statistically  significant improvement in mobility and quality of life.  Baseline: EVAL=54; 05/06/2023=62 Goal status: MET   3.  Patient will increase Berg Balance score by > 4 points to demonstrate decreased fall risk during functional activities. Baseline: 45; 05/06/2023= 47/56 3/20: 48/56  Goal status: PROGRESSING   4.   Patient will reduce timed up and go to <11 seconds to reduce fall risk and demonstrate improved transfer/gait ability. Baseline: EVAL=13.99 sec with cane; 05/06/2023= 13.01 sec without a cane. 3/20: 11.71 seconds no AD  Goal status: PROGRESSING  5.   Patient will increase 10 meter walk test to >1.29m/s as to improve gait speed for better community ambulation and to reduce fall risk. Baseline: EVAL= 0.58 m/s; 05/06/2023= 0.75 m/s without an AD 3/20: 1.2 m/s no AD  Goal status: MET  6.   Patient will increase six minute walk test distance to >1000 for progression to community ambulator and improve gait ability Baseline: EVAL: to be assessed 2nd visit 10/29: 715 ft; 05/06/2023= 750 feet in 3 min 54 sec without AD 3/20: 925 ft with SPC  Goal status: PROGRESSING    ASSESSMENT:  CLINICAL IMPRESSION: Patient reports feeling fatigued, not his normal self. Vitals checked and are within functional measures. Patient and patient's wife educated on signs and symptoms of UTI to watch for. Patient requires seated breaks throughout session. Pt will continue to benefit from skilled physical therapy intervention to address impairments, improve QOL, and attain therapy goals      OBJECTIVE IMPAIRMENTS: Abnormal gait, decreased activity tolerance, decreased balance, decreased cognition, decreased coordination, decreased endurance, decreased mobility, difficulty walking, decreased strength, and impaired flexibility.   ACTIVITY LIMITATIONS: carrying, lifting, bending, sitting, standing, squatting, stairs, and transfers  PARTICIPATION LIMITATIONS: cleaning, laundry, shopping, community activity, and yard  work  PERSONAL FACTORS: Age and 1-2 comorbidities: COPD, HTN  are also affecting patient's functional outcome.   REHAB POTENTIAL: Good  CLINICAL DECISION MAKING: Evolving/moderate complexity  EVALUATION COMPLEXITY: Moderate  PLAN:  PT FREQUENCY: 1-2x/week  PT DURATION: 12 weeks  PLANNED INTERVENTIONS: [id number]- PT Re-evaluation, 97110-Therapeutic exercises, 97530- Therapeutic activity, 97112- Neuromuscular re-education, 97535- Self Care, [id number]- Manual therapy, [phone]- Gait training, [phone]- Orthotic Fit/training, [phone]- Canalith repositioning, [phone]- Electrical stimulation (manual), Patient/Family education, Balance training, Stair training, Taping, Dry Needling, Joint mobilization, Spinal mobilization, Vestibular training, Visual/preceptual remediation/compensation, DME instructions, Cryotherapy, and Moist  heat  PLAN FOR NEXT SESSION:  Continue with balance training, and LE strengthening/conditioning to improve his overall functional endurance.    Precious Bard, PT 07/01/2023, 12:43 PM

## 2023-07-01 ENCOUNTER — Ambulatory Visit: Payer: No Typology Code available for payment source

## 2023-07-01 DIAGNOSIS — R269 Unspecified abnormalities of gait and mobility: Secondary | ICD-10-CM

## 2023-07-01 DIAGNOSIS — R2681 Unsteadiness on feet: Secondary | ICD-10-CM

## 2023-07-01 DIAGNOSIS — R262 Difficulty in walking, not elsewhere classified: Secondary | ICD-10-CM

## 2023-07-01 DIAGNOSIS — M6281 Muscle weakness (generalized): Secondary | ICD-10-CM

## 2023-07-02 NOTE — Therapy (Signed)
 OUTPATIENT PHYSICAL THERAPY NEURO TREATMENT   Patient Name: Craig Gilmore MRN: [id number] DOB:10-09-1934, 89 y.o., male Today's Date: 07/03/2023   PCP: Dr. Jerl Mina   REFERRING PROVIDER: Dr. Jerl Mina  END OF SESSION:  PT End of Session - 07/03/23 1101     Visit Number 22    Number of Visits 24    Date for PT Re-Evaluation 07/29/23    Progress Note Due on Visit 20    PT Start Time 1100    PT Stop Time 1144    PT Time Calculation (min) 44 min    Equipment Utilized During Treatment Gait belt    Activity Tolerance Patient tolerated treatment well    Behavior During Therapy WFL for tasks assessed/performed                                 Past Medical History:  Diagnosis Date   Chronic atrial fibrillation (HCC)    a.) CHA2DS2VASc = 3 (age x 2, HTN);  b.) s/p DCCV 08/20/2004 --> 50 J x 1 and 100 J x 1 --> coverted with SB. (+) post-procedure nausea; c.) rate/rhythm maintained without pharmacological intervention; chronically anticoagulated with warfarin   Colonic polyp    COPD (chronic obstructive pulmonary disease) (HCC)    Depression    Dyspnea    Edema    GERD (gastroesophageal reflux disease)    Gout    Hemorrhoids    Hyperlipidemia    Hypertension    Insomnia    a.) on hypnotic (zolpidem)   Long term current use of anticoagulant    a.) warfarin   OSA on CPAP    Pneumonia    Pulmonary HTN (HCC) 02/14/2016   a.) TTE 02/14/2016: RVSP 37.5 mmHg   Sick sinus syndrome (HCC)    Squamous cell carcinoma in situ 10/03/2009   L medial infrapectoral   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   L nose supratip   Stage 3b chronic kidney disease (CKD) (HCC)    Past Surgical History:  Procedure Laterality Date   CARDIOVERSION N/A 08/21/2004   Procedure: CIRECT CURRENT CARDIOVERSION; Location: [facility]; Surgeon: Harold Hedge, MD   CATARACT EXTRACTION, BILATERAL     CHOLECYSTECTOMY     COLONOSCOPY     IR EMBO TUMOR ORGAN [employer]  GUIDE ROADMAPPING  03/12/2023   IR RADIOLOGIST EVAL & MGMT  02/25/2023   IR RADIOLOGIST EVAL & MGMT  03/27/2023   IR RADIOLOGIST EVAL & MGMT  06/05/2023   NASAL SINUS SURGERY     Patient Active Problem List   Diagnosis Date Noted   Hyponatremia 12/19/2022   Sick sinus syndrome (HCC) 12/16/2022   Interstitial lung disease (HCC) 12/16/2022   Thoracic aortic aneurysm (HCC) 12/16/2022   Sepsis due to pneumonia (HCC) 12/16/2022   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 tophus 02/07/2022   Stage 3b chronic kidney disease (HCC) 02/07/2022   Hyperlipidemia 05/04/2015   Encounter for therapeutic drug monitoring 11/24/2013   Exertional dyspnea 11/23/2013   Chronic atrial fibrillation (HCC)    Pneumonia, organism unspecified(486) 08/17/2012    ONSET DATE: 2-3  REFERRING DIAG:  R26.81 (ICD-10-CM) - Unsteadiness on feet  R68.89 (ICD-10-CM) - Other general symptoms and signs  Z87.01 (ICD-10-CM) - Personal history of pneumonia (recurrent)    THERAPY DIAG:  Abnormality of gait and mobility  Muscle weakness (generalized)  Unsteadiness on feet  Difficulty in walking, not elsewhere classified  Rationale  for Evaluation and Treatment: Rehabilitation  SUBJECTIVE:                                                                                                                                                                                             SUBJECTIVE STATEMENT: Patient feels good today. No stumbles since her last.   Pt accompanied by:  wife- Craig Gilmore   PERTINENT HISTORY: Patient is a 89 year old male with referall from PCP for imbalance and past medical history includes a recent hospitalization- 12/16/2022- 5 days for sepsis and PNA. PMH includes renal lesion, A-Fib, COPD, Depression, HTN, GOUT, Sleep apnea,  and HLD.  Wife reports he has been having balance issues for 2-3 years but having more unsteadiness recently  PAIN:  Are you having pain? No  PRECAUTIONS: Fall  RED  FLAGS: None   WEIGHT BEARING RESTRICTIONS: No  FALLS: Has patient fallen in last 6 months?  No falls but several near falls.   LIVING ENVIRONMENT: Lives with: lives with their family- wife (son stays during the day)  Lives in: House/apartment Stairs: Yes: External: 3 steps; on right going up Has following equipment at home: Single point cane, Rollator  PLOF: Requires assistive device for independence and Needs assistance with ADLs  PATIENT GOALS: To improve my balance and not fall  OBJECTIVE:  Note: Objective measures were completed at Evaluation unless otherwise noted.  DIAGNOSTIC FINDINGS:  CT SCAN- 12/16/2022 IMPRESSION: 1. No evidence for aortic dissection or aneurysm. 2. Ascending thoracic aortic aneurysm measuring 4.3 x 3.7 cm. Recommend annual imaging followup by CTA or MRA. This recommendation follows 2010 ACCF/AHA/AATS/ACR/ASA/SCA/SCAI/SIR/STS/SVM Guidelines for the Diagnosis and Management of Patients with Thoracic Aortic Disease. Circulation. 2010; 121: Z610-R604. Aortic aneurysm NOS (ICD10-I71.9) 3. Pathologic mediastinal and right hilar lymphadenopathy of uncertain etiology. 4. Findings compatible with interstitial lung disease. 5. Ground-glass opacity in the right lower lobe measuring 3.3 cm. This may be infectious/inflammatory, but neoplasm can not be excluded. 6. 4.8 cm indeterminate right renal lesion, possibly complex cysts. Recommend further evaluation with dedicated CT or MRI. 7. Bladder diverticulum. 8. Colonic diverticulosis. 9. Prostatomegaly. 10. Small fat containing left inguinal hernia.     Electronically Signed   By: Darliss Cheney M.D.   On: 12/16/2022 23:30  COGNITION: Overall cognitive status: Impaired- forgetful   SENSATION: WFL  COORDINATION: Delayed at times with BLE  EDEMA:  None observed   POSTURE: rounded shoulders and forward head    LOWER EXTREMITY MMT:    MMT Right Eval Left Eval  Hip flexion 4 4  Hip extension     Hip abduction 4 4  Hip adduction  4 4  Hip internal rotation 4 4  Hip external rotation 4 4  Knee flexion 4 4  Knee extension 4 4  Ankle dorsiflexion 4 4  Ankle plantarflexion    Ankle inversion    Ankle eversion    (Blank rows = not tested)   TRANSFERS: Assistive device utilized: Single point cane  Sit to stand: Modified independence Stand to sit: Modified independence Chair to chair: Modified independence Floor:  Not tested   GAIT: Gait pattern: decreased arm swing- Right, decreased arm swing- Left, decreased step length- Right, decreased step length- Left, decreased stance time- Right, decreased stance time- Left, and decreased stride length Distance walked: > 200 feet in total Assistive device utilized: Single point cane Level of assistance: CGA Comments: decreased gait speed and VC to pick up feet  FUNCTIONAL TESTS:  5 times sit to stand: 22.68 sec withuout  Timed up and go (TUG): 13.99 sec with cane 6 minute walk test: To be performed next visit 10 meter walk test: 0.59 m/s Berg Balance Scale: 45/56  PATIENT SURVEYS:  FOTO 54  TODAY'S TREATMENT:                                                                                                                              DATE: 07/03/23    THERAPEUTIC ACTIVITIES: To improve functional movements patterns for everyday tasks   SuperSet: 2 sets  -ambulate 150 ft  -sit to stand 10x  Weave between 6 cones for spatial negotiation.x4 trials  Lateral step over half foam roller 15x each LE seated    Seated GTB hamstring curl 15x each LE BTB abduction 20x each      Neuro Re-ed: Standing with CGA next to support surface:  Airex pad: static stand 30 seconds x 2 trials, noticeable trembling of ankles/LE's with fatigue and challenge to maintain stability Airex pad: horizontal head turns  scanning room 10x ; cueing for arc of motion  Airex pad: vertical head turns 30 seconds, cueing for arc of motion, noticeable sway  with upward gaze increasing demand on ankle righting reaction musculature Airex pad: one foot on 6" step one foot on airex pad, hold position for 30 seconds, switch legs, 2x each LE; Airex pad 4" step toe taps 10x each LE      PATIENT EDUCATION: Education details: PT plan of care; goals Person educated: Patient Education method: Explanation Education comprehension: verbalized understanding  HOME EXERCISE PROGRAM:  Access Code: [email or web address] URL: [email or web address] Date: 03/20/2023 Prepared by: Maureen Ralphs  Exercises - Scapular Retraction with Resistance  - 1 x daily - 3 sets - 10 reps - Shoulder extension with resistance - Neutral  - 1 x daily - 7 x weekly - 3 sets - 10 reps   Access Code: [email or web address] URL: [email or web address] Date: 02/04/2023 Prepared by: Precious Bard  Exercises - Seated March  - 1 x daily - 7 x weekly - 2 sets - 10 reps - 5 hold -  Leg Extension  - 1 x daily - 7 x weekly - 2 sets - 10 reps - 3 hold - Seated Hip Abduction with Resistance  - 1 x daily - 7 x weekly - 2 sets - 10 reps - 5 hold  GOALS: Goals reviewed with patient? Yes  SHORT TERM GOALS: Target date: 03/13/2023  Pt will be independent with HEP in order to improve strength and balance in order to decrease fall risk and improve function at home and work.  Baseline: EVAL: No formal HEP in place; 05/06/2023= Patient reports knowledgeable of HEP but states has not been very compliant.  Goal status: Partially Met    LONG TERM GOALS: Target date: 07/29/23  1.  Patient (> 38 years old) will complete five times sit to stand test in < 15 seconds indicating an increased LE strength and improved balance. Baseline: EVAL: 22.68 without UE support; 05/06/2023= 16.79 sec without UE support 3/20: 11 seconds no hands Goal status: MET  2.  Patient will increase FOTO score to equal to or greater than  55   to demonstrate statistically significant improvement in mobility and  quality of life.  Baseline: EVAL=54; 05/06/2023=62 Goal status: MET   3.  Patient will increase Berg Balance score by > 4 points to demonstrate decreased fall risk during functional activities. Baseline: 45; 05/06/2023= 47/56 3/20: 48/56  Goal status: PROGRESSING   4.   Patient will reduce timed up and go to <11 seconds to reduce fall risk and demonstrate improved transfer/gait ability. Baseline: EVAL=13.99 sec with cane; 05/06/2023= 13.01 sec without a cane. 3/20: 11.71 seconds no AD  Goal status: PROGRESSING  5.   Patient will increase 10 meter walk test to >1.53m/s as to improve gait speed for better community ambulation and to reduce fall risk. Baseline: EVAL= 0.58 m/s; 05/06/2023= 0.75 m/s without an AD 3/20: 1.2 m/s no AD  Goal status: MET  6.   Patient will increase six minute walk test distance to >1000 for progression to community ambulator and improve gait ability Baseline: EVAL: to be assessed 2nd visit 10/29: 715 ft; 05/06/2023= 750 feet in 3 min 54 sec without AD 3/20: 925 ft with SPC  Goal status: PROGRESSING    ASSESSMENT:  CLINICAL IMPRESSION: Patient tolerates unstable surface well today with occasional instability with head movements. He is very fatigued by supersetting ambulation with sit to stands and will require continued endurance focused interventions. He is highly motivated throughout session.  Pt will continue to benefit from skilled physical therapy intervention to address impairments, improve QOL, and attain therapy goals      OBJECTIVE IMPAIRMENTS: Abnormal gait, decreased activity tolerance, decreased balance, decreased cognition, decreased coordination, decreased endurance, decreased mobility, difficulty walking, decreased strength, and impaired flexibility.   ACTIVITY LIMITATIONS: carrying, lifting, bending, sitting, standing, squatting, stairs, and transfers  PARTICIPATION LIMITATIONS: cleaning, laundry, shopping, community activity, and yard  work  PERSONAL FACTORS: Age and 1-2 comorbidities: COPD, HTN  are also affecting patient's functional outcome.   REHAB POTENTIAL: Good  CLINICAL DECISION MAKING: Evolving/moderate complexity  EVALUATION COMPLEXITY: Moderate  PLAN:  PT FREQUENCY: 1-2x/week  PT DURATION: 12 weeks  PLANNED INTERVENTIONS: 97164- PT Re-evaluation, 97110-Therapeutic exercises, 97530- Therapeutic activity, [id number]- Neuromuscular re-education, 97535- Self Care, 16109- Manual therapy, [id number]- Gait training, [phone]- Orthotic Fit/training, [phone]- Canalith repositioning, [phone]- Electrical stimulation (manual), Patient/Family education, Balance training, Stair training, Taping, Dry Needling, Joint mobilization, Spinal mobilization, Vestibular training, Visual/preceptual remediation/compensation, DME instructions, Cryotherapy, and Moist heat  PLAN FOR NEXT SESSION:  Continue with balance training, and LE strengthening/conditioning to improve his overall functional endurance.    Precious Bard, PT 07/03/2023, 12:35 PM

## 2023-07-03 ENCOUNTER — Ambulatory Visit

## 2023-07-03 DIAGNOSIS — R2681 Unsteadiness on feet: Secondary | ICD-10-CM

## 2023-07-03 DIAGNOSIS — R262 Difficulty in walking, not elsewhere classified: Secondary | ICD-10-CM

## 2023-07-03 DIAGNOSIS — R269 Unspecified abnormalities of gait and mobility: Secondary | ICD-10-CM | POA: Diagnosis not present

## 2023-07-03 DIAGNOSIS — M6281 Muscle weakness (generalized): Secondary | ICD-10-CM

## 2023-07-08 ENCOUNTER — Ambulatory Visit: Payer: No Typology Code available for payment source | Attending: Family Medicine

## 2023-07-08 DIAGNOSIS — M6281 Muscle weakness (generalized): Secondary | ICD-10-CM | POA: Diagnosis not present

## 2023-07-08 DIAGNOSIS — R2681 Unsteadiness on feet: Secondary | ICD-10-CM | POA: Diagnosis not present

## 2023-07-08 DIAGNOSIS — R278 Other lack of coordination: Secondary | ICD-10-CM | POA: Diagnosis not present

## 2023-07-08 DIAGNOSIS — R2689 Other abnormalities of gait and mobility: Secondary | ICD-10-CM | POA: Insufficient documentation

## 2023-07-08 DIAGNOSIS — R269 Unspecified abnormalities of gait and mobility: Secondary | ICD-10-CM | POA: Insufficient documentation

## 2023-07-08 DIAGNOSIS — R262 Difficulty in walking, not elsewhere classified: Secondary | ICD-10-CM | POA: Diagnosis present

## 2023-07-08 NOTE — Therapy (Signed)
 OUTPATIENT PHYSICAL THERAPY NEURO TREATMENT   Patient Name: Craig Gilmore MRN: [id number] DOB:Feb 08, 1935, 89 y.o., male Today's Date: 07/08/2023   PCP: Dr. Jerl Mina   REFERRING PROVIDER: Dr. Jerl Mina  END OF SESSION:  PT End of Session - 07/08/23 1142     Visit Number 23    Number of Visits 24    Date for PT Re-Evaluation 07/29/23    Progress Note Due on Visit 20    PT Start Time 1145    PT Stop Time 1229    PT Time Calculation (min) 44 min    Equipment Utilized During Treatment Gait belt    Activity Tolerance Patient tolerated treatment well    Behavior During Therapy WFL for tasks assessed/performed                                  Past Medical History:  Diagnosis Date   Chronic atrial fibrillation (HCC)    a.) CHA2DS2VASc = 3 (age x 2, HTN);  b.) s/p DCCV 08/20/2004 --> 50 J x 1 and 100 J x 1 --> coverted with SB. (+) post-procedure nausea; c.) rate/rhythm maintained without pharmacological intervention; chronically anticoagulated with warfarin   Colonic polyp    COPD (chronic obstructive pulmonary disease) (HCC)    Depression    Dyspnea    Edema    GERD (gastroesophageal reflux disease)    Gout    Hemorrhoids    Hyperlipidemia    Hypertension    Insomnia    a.) on hypnotic (zolpidem)   Long term current use of anticoagulant    a.) warfarin   OSA on CPAP    Pneumonia    Pulmonary HTN (HCC) 02/14/2016   a.) TTE 02/14/2016: RVSP 37.5 mmHg   Sick sinus syndrome (HCC)    Squamous cell carcinoma in situ 10/03/2009   L medial infrapectoral   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   L nose supratip   Stage 3b chronic kidney disease (CKD) (HCC)    Past Surgical History:  Procedure Laterality Date   CARDIOVERSION N/A 08/21/2004   Procedure: CIRECT CURRENT CARDIOVERSION; Location: [facility]; Surgeon: Harold Hedge, MD   CATARACT EXTRACTION, BILATERAL     CHOLECYSTECTOMY     COLONOSCOPY     IR EMBO TUMOR ORGAN ISCHEMIA INFARCT  INC GUIDE ROADMAPPING  03/12/2023   IR RADIOLOGIST EVAL & MGMT  02/25/2023   IR RADIOLOGIST EVAL & MGMT  03/27/2023   IR RADIOLOGIST EVAL & MGMT  06/05/2023   NASAL SINUS SURGERY     Patient Active Problem List   Diagnosis Date Noted   Hyponatremia 12/19/2022   Sick sinus syndrome (HCC) 12/16/2022   Interstitial lung disease (HCC) 12/16/2022   Thoracic aortic aneurysm (HCC) 12/16/2022   Sepsis due to pneumonia (HCC) 12/16/2022   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 tophus 02/07/2022   Stage 3b chronic kidney disease (HCC) 02/07/2022   Hyperlipidemia 05/04/2015   Encounter for therapeutic drug monitoring 11/24/2013   Exertional dyspnea 11/23/2013   Chronic atrial fibrillation (HCC)    Pneumonia, organism unspecified(486) 08/17/2012    ONSET DATE: 2-3  REFERRING DIAG:  R26.81 (ICD-10-CM) - Unsteadiness on feet  R68.89 (ICD-10-CM) - Other general symptoms and signs  Z87.01 (ICD-10-CM) - Personal history of pneumonia (recurrent)    THERAPY DIAG:  Abnormality of gait and mobility  Muscle weakness (generalized)  Unsteadiness on feet  Difficulty in walking, not elsewhere classified  Rationale for Evaluation and Treatment: Rehabilitation  SUBJECTIVE:                                                                                                                                                                                             SUBJECTIVE STATEMENT: Patient presents with good motivation. Reports no falls or LOB.   Pt accompanied by:  wife- [name]   PERTINENT HISTORY: Patient is a 89 year old male with referall from PCP for imbalance and past medical history includes a recent hospitalization- 12/16/2022- 5 days for sepsis and PNA. PMH includes renal lesion, A-Fib, COPD, Depression, HTN, GOUT, Sleep apnea,  and HLD.  Wife reports he has been having balance issues for 2-3 years but having more unsteadiness recently  PAIN:  Are you having pain? No  PRECAUTIONS:  Fall  RED FLAGS: None   WEIGHT BEARING RESTRICTIONS: No  FALLS: Has patient fallen in last 6 months?  No falls but several near falls.   LIVING ENVIRONMENT: Lives with: lives with their family- wife (son stays during the day)  Lives in: House/apartment Stairs: Yes: External: 3 steps; on right going up Has following equipment at home: Single point cane, Rollator  PLOF: Requires assistive device for independence and Needs assistance with ADLs  PATIENT GOALS: To improve my balance and not fall  OBJECTIVE:  Note: Objective measures were completed at Evaluation unless otherwise noted.  DIAGNOSTIC FINDINGS:  CT SCAN- 12/16/2022 IMPRESSION: 1. No evidence for aortic dissection or aneurysm. 2. Ascending thoracic aortic aneurysm measuring 4.3 x 3.7 cm. Recommend annual imaging followup by CTA or MRA. This recommendation follows 2010 ACCF/AHA/AATS/ACR/ASA/SCA/SCAI/SIR/STS/SVM Guidelines for the Diagnosis and Management of Patients with Thoracic Aortic Disease. Circulation. 2010; 121: Z610-R604. Aortic aneurysm NOS (ICD10-I71.9) 3. Pathologic mediastinal and right hilar lymphadenopathy of uncertain etiology. 4. Findings compatible with interstitial lung disease. 5. Ground-glass opacity in the right lower lobe measuring 3.3 cm. This may be infectious/inflammatory, but neoplasm can not be excluded. 6. 4.8 cm indeterminate right renal lesion, possibly complex cysts. Recommend further evaluation with dedicated CT or MRI. 7. Bladder diverticulum. 8. Colonic diverticulosis. 9. Prostatomegaly. 10. Small fat containing left inguinal hernia.     Electronically Signed   By: Darliss Cheney M.D.   On: 12/16/2022 23:30  COGNITION: Overall cognitive status: Impaired- forgetful   SENSATION: WFL  COORDINATION: Delayed at times with BLE  EDEMA:  None observed   POSTURE: rounded shoulders and forward head    LOWER EXTREMITY MMT:    MMT Right Eval Left Eval  Hip flexion 4 4   Hip extension    Hip abduction 4 4  Hip adduction 4 4  Hip internal rotation 4 4  Hip external rotation 4 4  Knee flexion 4 4  Knee extension 4 4  Ankle dorsiflexion 4 4  Ankle plantarflexion    Ankle inversion    Ankle eversion    (Blank rows = not tested)   TRANSFERS: Assistive device utilized: Single point cane  Sit to stand: Modified independence Stand to sit: Modified independence Chair to chair: Modified independence Floor:  Not tested   GAIT: Gait pattern: decreased arm swing- Right, decreased arm swing- Left, decreased step length- Right, decreased step length- Left, decreased stance time- Right, decreased stance time- Left, and decreased stride length Distance walked: > 200 feet in total Assistive device utilized: Single point cane Level of assistance: CGA Comments: decreased gait speed and VC to pick up feet  FUNCTIONAL TESTS:  5 times sit to stand: 22.68 sec withuout  Timed up and go (TUG): 13.99 sec with cane 6 minute walk test: To be performed next visit 10 meter walk test: 0.59 m/s Berg Balance Scale: 45/56  PATIENT SURVEYS:  FOTO 54  TODAY'S TREATMENT:                                                                                                                              DATE: 07/08/23    THERAPEUTIC ACTIVITIES: To improve functional movements patterns for everyday tasks     Lateral step over half foam roller 15x each LE seated  Step over half foam roller and back 10x forward/backward each LE   5lb ankle weights: -standing march 12x each LE; x 2 sets -standing hip abduction 12x each LE ; x 2 sets -hip extension 12x each LE; x2 sets  TE- To improve strength, endurance, mobility, and function of specific targeted muscle groups or improve joint range of motion or improve muscle flexibility 5lb AW: -LAQ 15x each LE -march 15x each LE  -heel raise 15x  Seated BTB hamstring curl 15x each LE BTB abduction 20x each        PATIENT  EDUCATION: Education details: PT plan of care; goals Person educated: Patient Education method: Explanation Education comprehension: verbalized understanding  HOME EXERCISE PROGRAM:  Access Code: [email or web address] URL: [email or web address] Date: 03/20/2023 Prepared by: Maureen Ralphs  Exercises - Scapular Retraction with Resistance  - 1 x daily - 3 sets - 10 reps - Shoulder extension with resistance - Neutral  - 1 x daily - 7 x weekly - 3 sets - 10 reps   Access Code: [email or web address] URL: [email or web address] Date: 02/04/2023 Prepared by: Precious Bard  Exercises - Seated March  - 1 x daily - 7 x weekly - 2 sets - 10 reps - 5 hold - Leg Extension  - 1 x daily - 7 x weekly - 2 sets - 10 reps - 3 hold - Seated Hip Abduction with Resistance  - 1 x daily - 7 x weekly - 2 sets - 10 reps - 5  hold  GOALS: Goals reviewed with patient? Yes  SHORT TERM GOALS: Target date: 03/13/2023  Pt will be independent with HEP in order to improve strength and balance in order to decrease fall risk and improve function at home and work.  Baseline: EVAL: No formal HEP in place; 05/06/2023= Patient reports knowledgeable of HEP but states has not been very compliant.  Goal status: Partially Met    LONG TERM GOALS: Target date: 07/29/23  1.  Patient (> 48 years old) will complete five times sit to stand test in < 15 seconds indicating an increased LE strength and improved balance. Baseline: EVAL: 22.68 without UE support; 05/06/2023= 16.79 sec without UE support 3/20: 11 seconds no hands Goal status: MET  2.  Patient will increase FOTO score to equal to or greater than  55   to demonstrate statistically significant improvement in mobility and quality of life.  Baseline: EVAL=54; 05/06/2023=62 Goal status: MET   3.  Patient will increase Berg Balance score by > 4 points to demonstrate decreased fall risk during functional activities. Baseline: 45; 05/06/2023= 47/56 3/20: 48/56  Goal  status: PROGRESSING   4.   Patient will reduce timed up and go to <11 seconds to reduce fall risk and demonstrate improved transfer/gait ability. Baseline: EVAL=13.99 sec with cane; 05/06/2023= 13.01 sec without a cane. 3/20: 11.71 seconds no AD  Goal status: PROGRESSING  5.   Patient will increase 10 meter walk test to >1.70m/s as to improve gait speed for better community ambulation and to reduce fall risk. Baseline: EVAL= 0.58 m/s; 05/06/2023= 0.75 m/s without an AD 3/20: 1.2 m/s no AD  Goal status: MET  6.   Patient will increase six minute walk test distance to >1000 for progression to community ambulator and improve gait ability Baseline: EVAL: to be assessed 2nd visit 10/29: 715 ft; 05/06/2023= 750 feet in 3 min 54 sec without AD 3/20: 925 ft with SPC  Goal status: PROGRESSING    ASSESSMENT:  CLINICAL IMPRESSION: Patient tolerates use of 5lb ankle weights. Patient and patient's wife educated on re-cert at end of the month. Patient is highly motivated throughout session; he requires multiple rest breaks due to fatigue.  Pt will continue to benefit from skilled physical therapy intervention to address impairments, improve QOL, and attain therapy goals      OBJECTIVE IMPAIRMENTS: Abnormal gait, decreased activity tolerance, decreased balance, decreased cognition, decreased coordination, decreased endurance, decreased mobility, difficulty walking, decreased strength, and impaired flexibility.   ACTIVITY LIMITATIONS: carrying, lifting, bending, sitting, standing, squatting, stairs, and transfers  PARTICIPATION LIMITATIONS: cleaning, laundry, shopping, community activity, and yard work  PERSONAL FACTORS: Age and 1-2 comorbidities: COPD, HTN  are also affecting patient's functional outcome.   REHAB POTENTIAL: Good  CLINICAL DECISION MAKING: Evolving/moderate complexity  EVALUATION COMPLEXITY: Moderate  PLAN:  PT FREQUENCY: 1-2x/week  PT DURATION: 12 weeks  PLANNED  INTERVENTIONS: [id number]- PT Re-evaluation, 97110-Therapeutic exercises, 97530- Therapeutic activity, [id number]- Neuromuscular re-education, 97535- Self Care, [id number]- Manual therapy, [id number]- Gait training, [phone]- Orthotic Fit/training, [phone]- Canalith repositioning, [phone]- Electrical stimulation (manual), Patient/Family education, Balance training, Stair training, Taping, Dry Needling, Joint mobilization, Spinal mobilization, Vestibular training, Visual/preceptual remediation/compensation, DME instructions, Cryotherapy, and Moist heat  PLAN FOR NEXT SESSION:  Continue with balance training, and LE strengthening/conditioning to improve his overall functional endurance.    Precious Bard, PT 07/08/2023, 12:29 PM

## 2023-07-10 ENCOUNTER — Ambulatory Visit: Payer: No Typology Code available for payment source

## 2023-07-15 ENCOUNTER — Ambulatory Visit: Payer: No Typology Code available for payment source

## 2023-07-17 ENCOUNTER — Ambulatory Visit: Payer: No Typology Code available for payment source

## 2023-07-17 ENCOUNTER — Ambulatory Visit: Payer: Self-pay | Admitting: Physician Assistant

## 2023-07-17 DIAGNOSIS — I482 Chronic atrial fibrillation, unspecified: Secondary | ICD-10-CM | POA: Diagnosis not present

## 2023-07-17 DIAGNOSIS — Z7901 Long term (current) use of anticoagulants: Secondary | ICD-10-CM | POA: Diagnosis not present

## 2023-07-21 ENCOUNTER — Ambulatory Visit: Payer: No Typology Code available for payment source

## 2023-07-22 ENCOUNTER — Ambulatory Visit
Admission: RE | Admit: 2023-07-22 | Discharge: 2023-07-22 | Disposition: A | Discharge status: Home / Self Care | Source: Ambulatory Visit | Attending: Physician Assistant | Admitting: Physician Assistant

## 2023-07-22 ENCOUNTER — Ambulatory Visit: Payer: No Typology Code available for payment source

## 2023-07-22 DIAGNOSIS — M6281 Muscle weakness (generalized): Secondary | ICD-10-CM

## 2023-07-22 DIAGNOSIS — R262 Difficulty in walking, not elsewhere classified: Secondary | ICD-10-CM

## 2023-07-22 DIAGNOSIS — N2889 Other specified disorders of kidney and ureter: Secondary | ICD-10-CM | POA: Insufficient documentation

## 2023-07-22 DIAGNOSIS — R269 Unspecified abnormalities of gait and mobility: Secondary | ICD-10-CM | POA: Diagnosis not present

## 2023-07-22 DIAGNOSIS — R339 Retention of urine, unspecified: Secondary | ICD-10-CM | POA: Insufficient documentation

## 2023-07-22 DIAGNOSIS — R2681 Unsteadiness on feet: Secondary | ICD-10-CM

## 2023-07-22 NOTE — Therapy (Signed)
 OUTPATIENT PHYSICAL THERAPY NEURO TREATMENT   Patient Name: Craig Gilmore MRN: [id number] DOB:June 12, 1934, 89 y.o., male Today's Date: 07/22/2023   PCP: Dr. Lyle San   REFERRING PROVIDER: Dr. Lyle San  END OF SESSION:  PT End of Session - 07/22/23 1144     Visit Number 24    Number of Visits 32   corrected   Date for PT Re-Evaluation 07/29/23    Progress Note Due on Visit 20    PT Start Time 1145    PT Stop Time 1229    PT Time Calculation (min) 44 min    Equipment Utilized During Treatment Gait belt    Activity Tolerance Patient tolerated treatment well    Behavior During Therapy WFL for tasks assessed/performed                                   Past Medical History:  Diagnosis Date   Chronic atrial fibrillation (HCC)    a.) CHA2DS2VASc = 3 (age x 2, HTN);  b.) s/p DCCV 08/20/2004 --> 50 J x 1 and 100 J x 1 --> coverted with SB. (+) post-procedure nausea; c.) rate/rhythm maintained without pharmacological intervention; chronically anticoagulated with warfarin   Colonic polyp    COPD (chronic obstructive pulmonary disease) (HCC)    Depression    Dyspnea    Edema    GERD (gastroesophageal reflux disease)    Gout    Hemorrhoids    Hyperlipidemia    Hypertension    Insomnia    a.) on hypnotic (zolpidem)   Long term current use of anticoagulant    a.) warfarin   OSA on CPAP    Pneumonia    Pulmonary HTN (HCC) 02/14/2016   a.) TTE 02/14/2016: RVSP 37.5 mmHg   Sick sinus syndrome (HCC)    Squamous cell carcinoma in situ 10/03/2009   L medial infrapectoral   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   L nose supratip   Stage 3b chronic kidney disease (CKD) (HCC)    Past Surgical History:  Procedure Laterality Date   CARDIOVERSION N/A 08/21/2004   Procedure: CIRECT CURRENT CARDIOVERSION; Location: [facility]; Surgeon: Starlette Ebbs, MD   CATARACT EXTRACTION, BILATERAL     CHOLECYSTECTOMY     COLONOSCOPY     IR EMBO TUMOR ORGAN  ISCHEMIA INFARCT INC GUIDE ROADMAPPING  03/12/2023   IR RADIOLOGIST EVAL & MGMT  02/25/2023   IR RADIOLOGIST EVAL & MGMT  03/27/2023   IR RADIOLOGIST EVAL & MGMT  06/05/2023   NASAL SINUS SURGERY     Patient Active Problem List   Diagnosis Date Noted   Hyponatremia 12/19/2022   Sick sinus syndrome (HCC) 12/16/2022   Interstitial lung disease (HCC) 12/16/2022   Thoracic aortic aneurysm (HCC) 12/16/2022   Sepsis due to pneumonia (HCC) 12/16/2022   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 tophus 02/07/2022   Stage 3b chronic kidney disease (HCC) 02/07/2022   Hyperlipidemia 05/04/2015   Encounter for therapeutic drug monitoring 11/24/2013   Exertional dyspnea 11/23/2013   Chronic atrial fibrillation (HCC)    Pneumonia, organism unspecified(486) 08/17/2012    ONSET DATE: 2-3  REFERRING DIAG:  R26.81 (ICD-10-CM) - Unsteadiness on feet  R68.89 (ICD-10-CM) - Other general symptoms and signs  Z87.01 (ICD-10-CM) - Personal history of pneumonia (recurrent)    THERAPY DIAG:  Abnormality of gait and mobility  Muscle weakness (generalized)  Unsteadiness on feet  Difficulty in walking, not  elsewhere classified  Rationale for Evaluation and Treatment: Rehabilitation  SUBJECTIVE:                                                                                                                                                                                             SUBJECTIVE STATEMENT: Patient has not been seen in two weeks due to wife/caregiver having significant fall.   Pt accompanied by:  wife- [name]   PERTINENT HISTORY: Patient is a 89 year old male with referall from PCP for imbalance and past medical history includes a recent hospitalization- 12/16/2022- 5 days for sepsis and PNA. PMH includes renal lesion, A-Fib, COPD, Depression, HTN, GOUT, Sleep apnea,  and HLD.  Wife reports he has been having balance issues for 2-3 years but having more unsteadiness recently  PAIN:  Are you  having pain? No  PRECAUTIONS: Fall  RED FLAGS: None   WEIGHT BEARING RESTRICTIONS: No  FALLS: Has patient fallen in last 6 months?  No falls but several near falls.   LIVING ENVIRONMENT: Lives with: lives with their family- wife (son stays during the day)  Lives in: House/apartment Stairs: Yes: External: 3 steps; on right going up Has following equipment at home: Single point cane, Rollator  PLOF: Requires assistive device for independence and Needs assistance with ADLs  PATIENT GOALS: To improve my balance and not fall  OBJECTIVE:  Note: Objective measures were completed at Evaluation unless otherwise noted.  DIAGNOSTIC FINDINGS:  CT SCAN- 12/16/2022 IMPRESSION: 1. No evidence for aortic dissection or aneurysm. 2. Ascending thoracic aortic aneurysm measuring 4.3 x 3.7 cm. Recommend annual imaging followup by CTA or MRA. This recommendation follows 2010 ACCF/AHA/AATS/ACR/ASA/SCA/SCAI/SIR/STS/SVM Guidelines for the Diagnosis and Management of Patients with Thoracic Aortic Disease. Circulation. 2010; 121: J811-B147. Aortic aneurysm NOS (ICD10-I71.9) 3. Pathologic mediastinal and right hilar lymphadenopathy of uncertain etiology. 4. Findings compatible with interstitial lung disease. 5. Ground-glass opacity in the right lower lobe measuring 3.3 cm. This may be infectious/inflammatory, but neoplasm can not be excluded. 6. 4.8 cm indeterminate right renal lesion, possibly complex cysts. Recommend further evaluation with dedicated CT or MRI. 7. Bladder diverticulum. 8. Colonic diverticulosis. 9. Prostatomegaly. 10. Small fat containing left inguinal hernia.     Electronically Signed   By: Darliss Cheney M.D.   On: 12/16/2022 23:30  COGNITION: Overall cognitive status: Impaired- forgetful   SENSATION: WFL  COORDINATION: Delayed at times with BLE  EDEMA:  None observed   POSTURE: rounded shoulders and forward head    LOWER EXTREMITY MMT:    MMT Right Eval  Left Eval  Hip flexion 4 4  Hip extension  Hip abduction 4 4  Hip adduction 4 4  Hip internal rotation 4 4  Hip external rotation 4 4  Knee flexion 4 4  Knee extension 4 4  Ankle dorsiflexion 4 4  Ankle plantarflexion    Ankle inversion    Ankle eversion    (Blank rows = not tested)   TRANSFERS: Assistive device utilized: Single point cane  Sit to stand: Modified independence Stand to sit: Modified independence Chair to chair: Modified independence Floor:  Not tested   GAIT: Gait pattern: decreased arm swing- Right, decreased arm swing- Left, decreased step length- Right, decreased step length- Left, decreased stance time- Right, decreased stance time- Left, and decreased stride length Distance walked: > 200 feet in total Assistive device utilized: Single point cane Level of assistance: CGA Comments: decreased gait speed and VC to pick up feet  FUNCTIONAL TESTS:  5 times sit to stand: 22.68 sec withuout  Timed up and go (TUG): 13.99 sec with cane 6 minute walk test: To be performed next visit 10 meter walk test: 0.59 m/s Berg Balance Scale: 45/56  PATIENT SURVEYS:  FOTO 54  TODAY'S TREATMENT:                                                                                                                              DATE: 07/22/23    THERAPEUTIC ACTIVITIES: To improve functional movements patterns for everyday tasks   6" -toe taps 10x each LE -step up/down 10x each LE -lateral step up/down 10x each LE  5lb ankle weights: -standing march 12x each LE;  -standing hip abduction 12x each LE ;  -hip extension 12x each LE;   10x STS  Tandem stance 30 seconds each LE  TE- To improve strength, endurance, mobility, and function of specific targeted muscle groups or improve joint range of motion or improve muscle flexibility 5lb AW: -LAQ 15x each LE -march 15x each LE  -heel raise 15x  Seated BTB hamstring curl 15x each LE BTB abduction 20x each         PATIENT EDUCATION: Education details: PT plan of care; goals Person educated: Patient Education method: Explanation Education comprehension: verbalized understanding  HOME EXERCISE PROGRAM:  Access Code: [email or web address] URL: [email or web address] Date: 03/20/2023 Prepared by: Maureen Ralphs  Exercises - Scapular Retraction with Resistance  - 1 x daily - 3 sets - 10 reps - Shoulder extension with resistance - Neutral  - 1 x daily - 7 x weekly - 3 sets - 10 reps   Access Code: [email or web address] URL: [email or web address] Date: 02/04/2023 Prepared by: Precious Bard  Exercises - Seated March  - 1 x daily - 7 x weekly - 2 sets - 10 reps - 5 hold - Leg Extension  - 1 x daily - 7 x weekly - 2 sets - 10 reps - 3 hold - Seated Hip Abduction with Resistance  - 1 x daily - 7 x weekly - 2 sets - 10 reps -  5 hold  GOALS: Goals reviewed with patient? Yes  SHORT TERM GOALS: Target date: 03/13/2023  Pt will be independent with HEP in order to improve strength and balance in order to decrease fall risk and improve function at home and work.  Baseline: EVAL: No formal HEP in place; 05/06/2023= Patient reports knowledgeable of HEP but states has not been very compliant.  Goal status: Partially Met    LONG TERM GOALS: Target date: 07/29/23  1.  Patient (> 70 years old) will complete five times sit to stand test in < 15 seconds indicating an increased LE strength and improved balance. Baseline: EVAL: 22.68 without UE support; 05/06/2023= 16.79 sec without UE support 3/20: 11 seconds no hands Goal status: MET  2.  Patient will increase FOTO score to equal to or greater than  55   to demonstrate statistically significant improvement in mobility and quality of life.  Baseline: EVAL=54; 05/06/2023=62 Goal status: MET   3.  Patient will increase Berg Balance score by > 4 points to demonstrate decreased fall risk during functional activities. Baseline: 45; 05/06/2023= 47/56  3/20: 48/56  Goal status: PROGRESSING   4.   Patient will reduce timed up and go to <11 seconds to reduce fall risk and demonstrate improved transfer/gait ability. Baseline: EVAL=13.99 sec with cane; 05/06/2023= 13.01 sec without a cane. 3/20: 11.71 seconds no AD  Goal status: PROGRESSING  5.   Patient will increase 10 meter walk test to >1.46m/s as to improve gait speed for better community ambulation and to reduce fall risk. Baseline: EVAL= 0.58 m/s; 05/06/2023= 0.75 m/s without an AD 3/20: 1.2 m/s no AD  Goal status: MET  6.   Patient will increase six minute walk test distance to >1000 for progression to community ambulator and improve gait ability Baseline: EVAL: to be assessed 2nd visit 10/29: 715 ft; 05/06/2023= 750 feet in 3 min 54 sec without AD 3/20: 925 ft with SPC  Goal status: PROGRESSING    ASSESSMENT:  CLINICAL IMPRESSION: Patient educated that testing will be done on 4/22 and he must show progress for continuation of care. Patient and wife verbalized understanding.  Patient is fatigued throughout session but remains highly motivated. He requires UE support for standing interventions due to single leg demand.  Pt will continue to benefit from skilled physical therapy intervention to address impairments, improve QOL, and attain therapy goals      OBJECTIVE IMPAIRMENTS: Abnormal gait, decreased activity tolerance, decreased balance, decreased cognition, decreased coordination, decreased endurance, decreased mobility, difficulty walking, decreased strength, and impaired flexibility.   ACTIVITY LIMITATIONS: carrying, lifting, bending, sitting, standing, squatting, stairs, and transfers  PARTICIPATION LIMITATIONS: cleaning, laundry, shopping, community activity, and yard work  PERSONAL FACTORS: Age and 1-2 comorbidities: COPD, HTN  are also affecting patient's functional outcome.   REHAB POTENTIAL: Good  CLINICAL DECISION MAKING: Evolving/moderate complexity  EVALUATION  COMPLEXITY: Moderate  PLAN:  PT FREQUENCY: 1-2x/week  PT DURATION: 12 weeks  PLANNED INTERVENTIONS: [id number]- PT Re-evaluation, 97110-Therapeutic exercises, 97530- Therapeutic activity, [id number]- Neuromuscular re-education, 97535- Self Care, [id number]- Manual therapy, [id number]- Gait training, [phone]- Orthotic Fit/training, [phone]- Canalith repositioning, [phone]- Electrical stimulation (manual), Patient/Family education, Balance training, Stair training, Taping, Dry Needling, Joint mobilization, Spinal mobilization, Vestibular training, Visual/preceptual remediation/compensation, DME instructions, Cryotherapy, and Moist heat  PLAN FOR NEXT SESSION:  Continue with balance training, and LE strengthening/conditioning to improve his overall functional endurance.    Precious Bard, PT 07/22/2023, 12:38 PM

## 2023-07-23 NOTE — Therapy (Signed)
 OUTPATIENT PHYSICAL THERAPY NEURO TREATMENT   Patient Name: Craig Gilmore MRN: [id number] DOB:10/01/1934, 89 y.o., male Today's Date: 07/24/2023   PCP: Dr. Lyle San   REFERRING PROVIDER: Dr. Lyle San  END OF SESSION:  PT End of Session - 07/24/23 1144     Visit Number 25    Number of Visits 32   corrected   Date for PT Re-Evaluation 07/29/23    Progress Note Due on Visit 20    PT Start Time 1144    PT Stop Time 1229    PT Time Calculation (min) 45 min    Equipment Utilized During Treatment Gait belt    Activity Tolerance Patient tolerated treatment well    Behavior During Therapy WFL for tasks assessed/performed                                    Past Medical History:  Diagnosis Date   Chronic atrial fibrillation (HCC)    a.) CHA2DS2VASc = 3 (age x 2, HTN);  b.) s/p DCCV 08/20/2004 --> 50 J x 1 and 100 J x 1 --> coverted with SB. (+) post-procedure nausea; c.) rate/rhythm maintained without pharmacological intervention; chronically anticoagulated with warfarin   Colonic polyp    COPD (chronic obstructive pulmonary disease) (HCC)    Depression    Dyspnea    Edema    GERD (gastroesophageal reflux disease)    Gout    Hemorrhoids    Hyperlipidemia    Hypertension    Insomnia    a.) on hypnotic (zolpidem)   Long term current use of anticoagulant    a.) warfarin   OSA on CPAP    Pneumonia    Pulmonary HTN (HCC) 02/14/2016   a.) TTE 02/14/2016: RVSP 37.5 mmHg   Sick sinus syndrome (HCC)    Squamous cell carcinoma in situ 10/03/2009   L medial infrapectoral   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   L nose supratip   Stage 3b chronic kidney disease (CKD) (HCC)    Past Surgical History:  Procedure Laterality Date   CARDIOVERSION N/A 08/21/2004   Procedure: CIRECT CURRENT CARDIOVERSION; Location: [facility]; Surgeon: Starlette Ebbs, MD   CATARACT EXTRACTION, BILATERAL     CHOLECYSTECTOMY     COLONOSCOPY     IR EMBO TUMOR ORGAN  ISCHEMIA INFARCT INC GUIDE ROADMAPPING  03/12/2023   IR RADIOLOGIST EVAL & MGMT  02/25/2023   IR RADIOLOGIST EVAL & MGMT  03/27/2023   IR RADIOLOGIST EVAL & MGMT  06/05/2023   NASAL SINUS SURGERY     Patient Active Problem List   Diagnosis Date Noted   Hyponatremia 12/19/2022   Sick sinus syndrome (HCC) 12/16/2022   Interstitial lung disease (HCC) 12/16/2022   Thoracic aortic aneurysm (HCC) 12/16/2022   Sepsis due to pneumonia (HCC) 12/16/2022   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 tophus 02/07/2022   Stage 3b chronic kidney disease (HCC) 02/07/2022   Hyperlipidemia 05/04/2015   Encounter for therapeutic drug monitoring 11/24/2013   Exertional dyspnea 11/23/2013   Chronic atrial fibrillation (HCC)    Pneumonia, organism unspecified(486) 08/17/2012    ONSET DATE: 2-3  REFERRING DIAG:  R26.81 (ICD-10-CM) - Unsteadiness on feet  R68.89 (ICD-10-CM) - Other general symptoms and signs  Z87.01 (ICD-10-CM) - Personal history of pneumonia (recurrent)    THERAPY DIAG:  Abnormality of gait and mobility  Muscle weakness (generalized)  Unsteadiness on feet  Difficulty in walking,  not elsewhere classified  Rationale for Evaluation and Treatment: Rehabilitation  SUBJECTIVE:                                                                                                                                                                                             SUBJECTIVE STATEMENT: No aches or pains reported, no stumbles.   Pt accompanied by:  wife- Kerney Pee   PERTINENT HISTORY: Patient is a 89 year old male with referall from PCP for imbalance and past medical history includes a recent hospitalization- 12/16/2022- 5 days for sepsis and PNA. PMH includes renal lesion, A-Fib, COPD, Depression, HTN, GOUT, Sleep apnea,  and HLD.  Wife reports he has been having balance issues for 2-3 years but having more unsteadiness recently  PAIN:  Are you having pain? No  PRECAUTIONS: Fall  RED  FLAGS: None   WEIGHT BEARING RESTRICTIONS: No  FALLS: Has patient fallen in last 6 months?  No falls but several near falls.   LIVING ENVIRONMENT: Lives with: lives with their family- wife (son stays during the day)  Lives in: House/apartment Stairs: Yes: External: 3 steps; on right going up Has following equipment at home: Single point cane, Rollator  PLOF: Requires assistive device for independence and Needs assistance with ADLs  PATIENT GOALS: To improve my balance and not fall  OBJECTIVE:  Note: Objective measures were completed at Evaluation unless otherwise noted.  DIAGNOSTIC FINDINGS:  CT SCAN- 12/16/2022 IMPRESSION: 1. No evidence for aortic dissection or aneurysm. 2. Ascending thoracic aortic aneurysm measuring 4.3 x 3.7 cm. Recommend annual imaging followup by CTA or MRA. This recommendation follows 2010 ACCF/AHA/AATS/ACR/ASA/SCA/SCAI/SIR/STS/SVM Guidelines for the Diagnosis and Management of Patients with Thoracic Aortic Disease. Circulation. 2010; 121: Q259-D638. Aortic aneurysm NOS (ICD10-I71.9) 3. Pathologic mediastinal and right hilar lymphadenopathy of uncertain etiology. 4. Findings compatible with interstitial lung disease. 5. Ground-glass opacity in the right lower lobe measuring 3.3 cm. This may be infectious/inflammatory, but neoplasm can not be excluded. 6. 4.8 cm indeterminate right renal lesion, possibly complex cysts. Recommend further evaluation with dedicated CT or MRI. 7. Bladder diverticulum. 8. Colonic diverticulosis. 9. Prostatomegaly. 10. Small fat containing left inguinal hernia.     Electronically Signed   By: Tyron Gallon M.D.   On: 12/16/2022 23:30  COGNITION: Overall cognitive status: Impaired- forgetful   SENSATION: WFL  COORDINATION: Delayed at times with BLE  EDEMA:  None observed   POSTURE: rounded shoulders and forward head    LOWER EXTREMITY MMT:    MMT Right Eval Left Eval  Hip flexion 4 4  Hip extension     Hip abduction 4 4  Hip adduction 4 4  Hip internal rotation 4 4  Hip external rotation 4 4  Knee flexion 4 4  Knee extension 4 4  Ankle dorsiflexion 4 4  Ankle plantarflexion    Ankle inversion    Ankle eversion    (Blank rows = not tested)   TRANSFERS: Assistive device utilized: Single point cane  Sit to stand: Modified independence Stand to sit: Modified independence Chair to chair: Modified independence Floor:  Not tested   GAIT: Gait pattern: decreased arm swing- Right, decreased arm swing- Left, decreased step length- Right, decreased step length- Left, decreased stance time- Right, decreased stance time- Left, and decreased stride length Distance walked: > 200 feet in total Assistive device utilized: Single point cane Level of assistance: CGA Comments: decreased gait speed and VC to pick up feet  FUNCTIONAL TESTS:  5 times sit to stand: 22.68 sec withuout  Timed up and go (TUG): 13.99 sec with cane 6 minute walk test: To be performed next visit 10 meter walk test: 0.59 m/s Berg Balance Scale: 45/56  PATIENT SURVEYS:  FOTO 54  TODAY'S TREATMENT:                                                                                                                              DATE: 07/24/23    THERAPEUTIC ACTIVITIES: To improve functional movements patterns for everyday tasks   Superset: three sets  Ambulate 150 ft no AD with close CGA; difficulty with doorframes 10 x STS  Stand up walk 10 ft, turn around a cone and return to chair x5 trials  TE- To improve strength, endurance, mobility, and function of specific targeted muscle groups or improve joint range of motion or improve muscle flexibility   Seated BTB hamstring curl 15x each LE BTB abduction 20x each  BTB march 10x each LE BTB alternating ER/IR 15x   Neuro Re-ed: Single leg stance 30 seconds each LE x 2 sets Tandem stance 30 seconds x 2 trials    PATIENT EDUCATION: Education details: PT plan of  care; goals Person educated: Patient Education method: Explanation Education comprehension: verbalized understanding  HOME EXERCISE PROGRAM:  Access Code: [email or web address] URL: [email or web address] Date: 03/20/2023 Prepared by: Maureen Ralphs  Exercises - Scapular Retraction with Resistance  - 1 x daily - 3 sets - 10 reps - Shoulder extension with resistance - Neutral  - 1 x daily - 7 x weekly - 3 sets - 10 reps   Access Code: [email or web address] URL: [email or web address] Date: 02/04/2023 Prepared by: Precious Bard  Exercises - Seated March  - 1 x daily - 7 x weekly - 2 sets - 10 reps - 5 hold - Leg Extension  - 1 x daily - 7 x weekly - 2 sets - 10 reps - 3 hold - Seated Hip Abduction with Resistance  - 1 x daily - 7 x weekly - 2 sets - 10 reps - 5 hold  GOALS: Goals reviewed with  patient? Yes  SHORT TERM GOALS: Target date: 03/13/2023  Pt will be independent with HEP in order to improve strength and balance in order to decrease fall risk and improve function at home and work.  Baseline: EVAL: No formal HEP in place; 05/06/2023= Patient reports knowledgeable of HEP but states has not been very compliant.  Goal status: Partially Met    LONG TERM GOALS: Target date: 07/29/23  1.  Patient (> 74 years old) will complete five times sit to stand test in < 15 seconds indicating an increased LE strength and improved balance. Baseline: EVAL: 22.68 without UE support; 05/06/2023= 16.79 sec without UE support 3/20: 11 seconds no hands Goal status: MET  2.  Patient will increase FOTO score to equal to or greater than  55   to demonstrate statistically significant improvement in mobility and quality of life.  Baseline: EVAL=54; 05/06/2023=62 Goal status: MET   3.  Patient will increase Berg Balance score by > 4 points to demonstrate decreased fall risk during functional activities. Baseline: 45; 05/06/2023= 47/56 3/20: 48/56  Goal status: PROGRESSING   4.   Patient  will reduce timed up and go to <11 seconds to reduce fall risk and demonstrate improved transfer/gait ability. Baseline: EVAL=13.99 sec with cane; 05/06/2023= 13.01 sec without a cane. 3/20: 11.71 seconds no AD  Goal status: PROGRESSING  5.   Patient will increase 10 meter walk test to >1.54m/s as to improve gait speed for better community ambulation and to reduce fall risk. Baseline: EVAL= 0.58 m/s; 05/06/2023= 0.75 m/s without an AD 3/20: 1.2 m/s no AD  Goal status: MET  6.   Patient will increase six minute walk test distance to >1000 for progression to community ambulator and improve gait ability Baseline: EVAL: to be assessed 2nd visit 10/29: 715 ft; 05/06/2023= 750 feet in 3 min 54 sec without AD 3/20: 925 ft with SPC  Goal status: PROGRESSING    ASSESSMENT:  CLINICAL IMPRESSION: Patient and patient's wife aware of testing next week. Patient is fatigued with supersets. Patient is highly motivated throughout session. He is able to perform quick turns this session with guidance however does require occasional cues to task.  Pt will continue to benefit from skilled physical therapy intervention to address impairments, improve QOL, and attain therapy goals      OBJECTIVE IMPAIRMENTS: Abnormal gait, decreased activity tolerance, decreased balance, decreased cognition, decreased coordination, decreased endurance, decreased mobility, difficulty walking, decreased strength, and impaired flexibility.   ACTIVITY LIMITATIONS: carrying, lifting, bending, sitting, standing, squatting, stairs, and transfers  PARTICIPATION LIMITATIONS: cleaning, laundry, shopping, community activity, and yard work  PERSONAL FACTORS: Age and 1-2 comorbidities: COPD, HTN  are also affecting patient's functional outcome.   REHAB POTENTIAL: Good  CLINICAL DECISION MAKING: Evolving/moderate complexity  EVALUATION COMPLEXITY: Moderate  PLAN:  PT FREQUENCY: 1-2x/week  PT DURATION: 12 weeks  PLANNED  INTERVENTIONS: 97164- PT Re-evaluation, 97110-Therapeutic exercises, 97530- Therapeutic activity, [id number]- Neuromuscular re-education, 97535- Self Care, [id number]- Manual therapy, [id number]- Gait training, [phone]- Orthotic Fit/training, [phone]- Canalith repositioning, [phone]- Electrical stimulation (manual), Patient/Family education, Balance training, Stair training, Taping, Dry Needling, Joint mobilization, Spinal mobilization, Vestibular training, Visual/preceptual remediation/compensation, DME instructions, Cryotherapy, and Moist heat  PLAN FOR NEXT SESSION:  Continue with balance training, and LE strengthening/conditioning to improve his overall functional endurance.    Precious Bard, PT 07/24/2023, 8:18 PM

## 2023-07-24 ENCOUNTER — Ambulatory Visit: Payer: No Typology Code available for payment source

## 2023-07-24 DIAGNOSIS — R262 Difficulty in walking, not elsewhere classified: Secondary | ICD-10-CM

## 2023-07-24 DIAGNOSIS — R269 Unspecified abnormalities of gait and mobility: Secondary | ICD-10-CM | POA: Diagnosis not present

## 2023-07-24 DIAGNOSIS — M6281 Muscle weakness (generalized): Secondary | ICD-10-CM

## 2023-07-24 DIAGNOSIS — R2681 Unsteadiness on feet: Secondary | ICD-10-CM

## 2023-07-28 NOTE — Therapy (Signed)
 OUTPATIENT PHYSICAL THERAPY NEURO TREATMENT/ RECERT    Patient Name: Craig Gilmore MRN: [id number] DOB:19-Nov-1934, 89 y.o., male Today's Date: 07/29/2023   PCP: Dr. Lyle San   REFERRING PROVIDER: Dr. Lyle San  END OF SESSION:  PT End of Session - 07/29/23 1225     Visit Number 26    Number of Visits 50   corrected   Date for PT Re-Evaluation 10/21/23    Progress Note Due on Visit 20    PT Start Time 1145    PT Stop Time 1226    PT Time Calculation (min) 41 min    Equipment Utilized During Treatment Gait belt    Activity Tolerance Patient tolerated treatment well    Behavior During Therapy WFL for tasks assessed/performed                                     Past Medical History:  Diagnosis Date   Chronic atrial fibrillation (HCC)    a.) CHA2DS2VASc = 3 (age x 2, HTN);  b.) s/p DCCV 08/20/2004 --> 50 J x 1 and 100 J x 1 --> coverted with SB. (+) post-procedure nausea; c.) rate/rhythm maintained without pharmacological intervention; chronically anticoagulated with warfarin   Colonic polyp    COPD (chronic obstructive pulmonary disease) (HCC)    Depression    Dyspnea    Edema    GERD (gastroesophageal reflux disease)    Gout    Hemorrhoids    Hyperlipidemia    Hypertension    Insomnia    a.) on hypnotic (zolpidem )   Long term current use of anticoagulant    a.) warfarin   OSA on CPAP    Pneumonia    Pulmonary HTN (HCC) 02/14/2016   a.) TTE 02/14/2016: RVSP 37.5 mmHg   Sick sinus syndrome (HCC)    Squamous cell carcinoma in situ 10/03/2009   L medial infrapectoral   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   L nose supratip   Stage 3b chronic kidney disease (CKD) (HCC)    Past Surgical History:  Procedure Laterality Date   CARDIOVERSION N/A 08/21/2004   Procedure: CIRECT CURRENT CARDIOVERSION; Location: [facility]; Surgeon: Starlette Ebbs, MD   CATARACT EXTRACTION, BILATERAL     CHOLECYSTECTOMY     COLONOSCOPY     IR EMBO  TUMOR ORGAN ISCHEMIA INFARCT INC GUIDE ROADMAPPING  03/12/2023   IR RADIOLOGIST EVAL & MGMT  02/25/2023   IR RADIOLOGIST EVAL & MGMT  03/27/2023   IR RADIOLOGIST EVAL & MGMT  06/05/2023   NASAL SINUS SURGERY     Patient Active Problem List   Diagnosis Date Noted   Hyponatremia 12/19/2022   Sick sinus syndrome (HCC) 12/16/2022   Interstitial lung disease (HCC) 12/16/2022   Thoracic aortic aneurysm (HCC) 12/16/2022   Sepsis due to pneumonia (HCC) 12/16/2022   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 tophus 02/07/2022   Stage 3b chronic kidney disease (HCC) 02/07/2022   Hyperlipidemia 05/04/2015   Encounter for therapeutic drug monitoring 11/24/2013   Exertional dyspnea 11/23/2013   Chronic atrial fibrillation (HCC)    Pneumonia, organism unspecified(486) 08/17/2012    ONSET DATE: 2-3  REFERRING DIAG:  R26.81 (ICD-10-CM) - Unsteadiness on feet  R68.89 (ICD-10-CM) - Other general symptoms and signs  Z87.01 (ICD-10-CM) - Personal history of pneumonia (recurrent)    THERAPY DIAG:  Abnormality of gait and mobility  Muscle weakness (generalized)  Unsteadiness on feet  Difficulty in walking, not elsewhere classified  Rationale for Evaluation and Treatment: Rehabilitation  SUBJECTIVE:                                                                                                                                                                                             SUBJECTIVE STATEMENT: Patient is aware today is testing day. Is having issues with his L eye.   Pt accompanied by:  wife- Kerney Pee   PERTINENT HISTORY: Patient is a 89 year old male with referall from PCP for imbalance and past medical history includes a recent hospitalization- 12/16/2022- 5 days for sepsis and PNA. PMH includes renal lesion, A-Fib, COPD, Depression, HTN, GOUT, Sleep apnea,  and HLD.  Wife reports he has been having balance issues for 2-3 years but having more unsteadiness recently  PAIN:  Are you  having pain? No  PRECAUTIONS: Fall  RED FLAGS: None   WEIGHT BEARING RESTRICTIONS: No  FALLS: Has patient fallen in last 6 months?  No falls but several near falls.   LIVING ENVIRONMENT: Lives with: lives with their family- wife (son stays during the day)  Lives in: House/apartment Stairs: Yes: External: 3 steps; on right going up Has following equipment at home: Single point cane, Rollator  PLOF: Requires assistive device for independence and Needs assistance with ADLs  PATIENT GOALS: To improve my balance and not fall  OBJECTIVE:  Note: Objective measures were completed at Evaluation unless otherwise noted.  DIAGNOSTIC FINDINGS:  CT SCAN- 12/16/2022 IMPRESSION: 1. No evidence for aortic dissection or aneurysm. 2. Ascending thoracic aortic aneurysm measuring 4.3 x 3.7 cm. Recommend annual imaging followup by CTA or MRA. This recommendation follows 2010 ACCF/AHA/AATS/ACR/ASA/SCA/SCAI/SIR/STS/SVM Guidelines for the Diagnosis and Management of Patients with Thoracic Aortic Disease. Circulation. 2010; 121: U132-G401. Aortic aneurysm NOS (ICD10-I71.9) 3. Pathologic mediastinal and right hilar lymphadenopathy of uncertain etiology. 4. Findings compatible with interstitial lung disease. 5. Ground-glass opacity in the right lower lobe measuring 3.3 cm. This may be infectious/inflammatory, but neoplasm can not be excluded. 6. 4.8 cm indeterminate right renal lesion, possibly complex cysts. Recommend further evaluation with dedicated CT or MRI. 7. Bladder diverticulum. 8. Colonic diverticulosis. 9. Prostatomegaly. 10. Small fat containing left inguinal hernia.     Electronically Signed   By: Tyron Gallon M.D.   On: 12/16/2022 23:30  COGNITION: Overall cognitive status: Impaired- forgetful   SENSATION: WFL  COORDINATION: Delayed at times with BLE  EDEMA:  None observed   POSTURE: rounded shoulders and forward head    LOWER EXTREMITY MMT:    MMT Right Eval  Left Eval  Hip flexion 4 4  Hip extension    Hip abduction 4 4  Hip adduction 4 4  Hip internal rotation 4 4  Hip external rotation 4 4  Knee flexion 4 4  Knee extension 4 4  Ankle dorsiflexion 4 4  Ankle plantarflexion    Ankle inversion    Ankle eversion    (Blank rows = not tested)   TRANSFERS: Assistive device utilized: Single point cane  Sit to stand: Modified independence Stand to sit: Modified independence Chair to chair: Modified independence Floor:  Not tested   GAIT: Gait pattern: decreased arm swing- Right, decreased arm swing- Left, decreased step length- Right, decreased step length- Left, decreased stance time- Right, decreased stance time- Left, and decreased stride length Distance walked: > 200 feet in total Assistive device utilized: Single point cane Level of assistance: CGA Comments: decreased gait speed and VC to pick up feet  FUNCTIONAL TESTS:  5 times sit to stand: 22.68 sec withuout  Timed up and go (TUG): 13.99 sec with cane 6 minute walk test: To be performed next visit 10 meter walk test: 0.59 m/s Berg Balance Scale: 45/56  PATIENT SURVEYS:  FOTO 54  TODAY'S TREATMENT:                                                                                                                              DATE: 07/29/23   Physical therapy treatment session today consisted of completing assessment of goals and administration of testing as demonstrated and documented in flow sheet, treatment, and goals section of this note. Addition treatments may be found below.   6 Min Walk Test:  Instructed patient to ambulate as quickly and as safely as possible for 6 minutes using LRAD. Patient was allowed to take standing rest breaks without stopping the test, but if the patient required a sitting rest break the clock would be stopped and the test would be over.  Results: 930 feet using a SPC with CGA. Results indicate that the patient has reduced endurance with  ambulation compared to age matched norms.  Age Matched Norms: 61-69 yo M: 11 F: 89, 67-79 yo M: 23 F: 471, 30-89 yo M: 417 F: 392 MDC: 58.21 meters (190.98 feet) or 50 meters (ANPTA Core Set of Outcome Measures for Adults with Neurologic Conditions, 2018)   PT instructed pt in TUG: 10 sec (average of 3 trials; >13.5 sec indicates increased fall risk)   [facility] PT Assessment - 07/29/23 0001       Berg Balance Test   Sit to Stand Able to stand without using hands and stabilize independently    Standing Unsupported Able to stand safely 2 minutes    Sitting with Back Unsupported but Feet Supported on Floor or Stool Able to sit safely and securely 2 minutes    Stand to Sit Sits safely with minimal use of hands    Transfers Able to transfer safely, minor use of hands    Standing Unsupported  with Eyes Closed Able to stand 10 seconds safely    Standing Unsupported with Feet Together Able to place feet together independently and stand 1 minute safely    From Standing, Reach Forward with Outstretched Arm Can reach confidently >25 cm (10")    From Standing Position, Pick up Object from Floor Able to pick up shoe safely and easily    From Standing Position, Turn to Look Behind Over each Shoulder Looks behind from both sides and weight shifts well    Turn 360 Degrees Able to turn 360 degrees safely one side only in 4 seconds or less    Standing Unsupported, Alternately Place Feet on Step/Stool Able to stand independently and complete 8 steps >20 seconds    Standing Unsupported, One Foot in Front Able to take small step independently and hold 30 seconds    Standing on One Leg Able to lift leg independently and hold equal to or more than 3 seconds    Total Score 50              THERAPEUTIC ACTIVITIES: To improve functional movements patterns for everyday tasks   Green step with one purple raiser: -toe taps 12x each LE -lateral step up/down 10x each side      PATIENT EDUCATION: Education  details: PT plan of care; goals Person educated: Patient Education method: Explanation Education comprehension: verbalized understanding  HOME EXERCISE PROGRAM:  Access Code: [email or web address] URL: [email or web address] Date: 03/20/2023 Prepared by: Ferrell Hu  Exercises - Scapular Retraction with Resistance  - 1 x daily - 3 sets - 10 reps - Shoulder extension with resistance - Neutral  - 1 x daily - 7 x weekly - 3 sets - 10 reps   Access Code: [email or web address] URL: [email or web address] Date: 02/04/2023 Prepared by: Shaddai Shapley  Exercises - Seated March  - 1 x daily - 7 x weekly - 2 sets - 10 reps - 5 hold - Leg Extension  - 1 x daily - 7 x weekly - 2 sets - 10 reps - 3 hold - Seated Hip Abduction with Resistance  - 1 x daily - 7 x weekly - 2 sets - 10 reps - 5 hold  GOALS: Goals reviewed with patient? Yes  SHORT TERM GOALS: Target date: 03/13/2023  Pt will be independent with HEP in order to improve strength and balance in order to decrease fall risk and improve function at home and work.  Baseline: EVAL: No formal HEP in place; 05/06/2023= Patient reports knowledgeable of HEP but states has not been very compliant. 4/22: intermittent compliance  Goal status: Partially Met    LONG TERM GOALS: Target date: 10/21/2023   1.  Patient (> 29 years old) will complete five times sit to stand test in < 15 seconds indicating an increased LE strength and improved balance. Baseline: EVAL: 22.68 without UE support; 05/06/2023= 16.79 sec without UE support 3/20: 11 seconds no hands Goal status: MET  2.  Patient will increase FOTO score to equal to or greater than  55   to demonstrate statistically significant improvement in mobility and quality of life.  Baseline: EVAL=54; 05/06/2023=62 Goal status: MET   3.  Patient will increase Berg Balance score by > 4 points to demonstrate decreased fall risk during functional activities. Baseline: 45; 05/06/2023= 47/56 3/20:  48/56 4/22: 50/56 Goal status: MET/PROGRESSED   4.   Patient will reduce timed up and go to <11 seconds to reduce fall risk and demonstrate improved transfer/gait ability. Baseline: EVAL=13.99 sec  with cane; 05/06/2023= 13.01 sec without a cane. 3/20: 11.71 seconds no AD 4/22: 10 seconds Goal status: MET  5.   Patient will increase 10 meter walk test to >1.61m/s as to improve gait speed for better community ambulation and to reduce fall risk. Baseline: EVAL= 0.58 m/s; 05/06/2023= 0.75 m/s without an AD 3/20: 1.2 m/s no AD  Goal status: MET  6.   Patient will increase six minute walk test distance to >1000 for progression to community ambulator and improve gait ability Baseline: EVAL: to be assessed 2nd visit 10/29: 715 ft; 05/06/2023= 750 feet in 3 min 54 sec without AD 3/20: 925 ft with [facility] 4/22: 930 ft with SPC  Goal status: PROGRESSING   7.   Patient will participate and attend silver sneaker classes weekly to increase independent mobility and strength. Baseline:new goal;  Goal status: NEW   ASSESSMENT:  CLINICAL IMPRESSION: Patient to be recert one final round. He is in agreement to join silver sneaker classes to compliment PT as he has intermittent compliance with HEP.  He is motivated throughout session.  He is agreeable for one final round of PT as long as he remains compliant with HEP.  Pt will continue to benefit from skilled physical therapy intervention to address impairments, improve QOL, and attain therapy goals      OBJECTIVE IMPAIRMENTS: Abnormal gait, decreased activity tolerance, decreased balance, decreased cognition, decreased coordination, decreased endurance, decreased mobility, difficulty walking, decreased strength, and impaired flexibility.   ACTIVITY LIMITATIONS: carrying, lifting, bending, sitting, standing, squatting, stairs, and transfers  PARTICIPATION LIMITATIONS: cleaning, laundry, shopping, community activity, and yard work  PERSONAL FACTORS: Age and  1-2 comorbidities: COPD, HTN  are also affecting patient's functional outcome.   REHAB POTENTIAL: Good  CLINICAL DECISION MAKING: Evolving/moderate complexity  EVALUATION COMPLEXITY: Moderate  PLAN:  PT FREQUENCY: 1-2x/week  PT DURATION: 12 weeks  PLANNED INTERVENTIONS: [id number]- PT Re-evaluation, 97110-Therapeutic exercises, 97530- Therapeutic activity, [id number]- Neuromuscular re-education, 97535- Self Care, [id number]- Manual therapy, [id number]- Gait training, [phone]- Orthotic Fit/training, [phone]- Canalith repositioning, [phone]- Electrical stimulation (manual), Patient/Family education, Balance training, Stair training, Taping, Dry Needling, Joint mobilization, Spinal mobilization, Vestibular training, Visual/preceptual remediation/compensation, DME instructions, Cryotherapy, and Moist heat  PLAN FOR NEXT SESSION:  Continue with balance training, and LE strengthening/conditioning to improve his overall functional endurance.    Merelyn Klump, PT 07/29/2023, 12:27 PM

## 2023-07-29 ENCOUNTER — Ambulatory Visit: Payer: No Typology Code available for payment source

## 2023-07-29 DIAGNOSIS — R269 Unspecified abnormalities of gait and mobility: Secondary | ICD-10-CM

## 2023-07-29 DIAGNOSIS — R2681 Unsteadiness on feet: Secondary | ICD-10-CM

## 2023-07-29 DIAGNOSIS — R262 Difficulty in walking, not elsewhere classified: Secondary | ICD-10-CM

## 2023-07-29 DIAGNOSIS — M6281 Muscle weakness (generalized): Secondary | ICD-10-CM

## 2023-07-31 ENCOUNTER — Ambulatory Visit: Payer: No Typology Code available for payment source

## 2023-07-31 ENCOUNTER — Ambulatory Visit: Payer: No Typology Code available for payment source | Admitting: Physician Assistant

## 2023-08-05 ENCOUNTER — Ambulatory Visit: Admitting: Physician Assistant

## 2023-08-05 ENCOUNTER — Ambulatory Visit: Payer: No Typology Code available for payment source | Admitting: Physical Therapy

## 2023-08-05 DIAGNOSIS — R2681 Unsteadiness on feet: Secondary | ICD-10-CM

## 2023-08-05 DIAGNOSIS — R262 Difficulty in walking, not elsewhere classified: Secondary | ICD-10-CM

## 2023-08-05 DIAGNOSIS — R269 Unspecified abnormalities of gait and mobility: Secondary | ICD-10-CM

## 2023-08-05 DIAGNOSIS — R2689 Other abnormalities of gait and mobility: Secondary | ICD-10-CM

## 2023-08-05 DIAGNOSIS — M6281 Muscle weakness (generalized): Secondary | ICD-10-CM

## 2023-08-05 DIAGNOSIS — R278 Other lack of coordination: Secondary | ICD-10-CM

## 2023-08-05 NOTE — Therapy (Signed)
 OUTPATIENT PHYSICAL THERAPY NEURO TREATMENT   Patient Name: Craig Gilmore MRN: [id number] DOB:[birth year], 89 y.o., male Today's Date: 08/05/2023   PCP: Dr. Lyle San   REFERRING PROVIDER: Dr. Lyle San  END OF SESSION:  PT End of Session - 08/05/23 1152     Visit Number 27    Number of Visits 50   corrected   Date for PT Re-Evaluation 10/21/23    Progress Note Due on Visit --    PT Start Time 1152    PT Stop Time 1231    PT Time Calculation (min) 39 min    Equipment Utilized During Treatment Gait belt    Activity Tolerance Patient tolerated treatment well    Behavior During Therapy WFL for tasks assessed/performed             Past Medical History:  Diagnosis Date   Chronic atrial fibrillation (HCC)    a.) CHA2DS2VASc = 3 (age x 2, HTN);  b.) s/p DCCV 08/20/2004 --> 50 J x 1 and 100 J x 1 --> coverted with SB. (+) post-procedure nausea; c.) rate/rhythm maintained without pharmacological intervention; chronically anticoagulated with warfarin   Colonic polyp    COPD (chronic obstructive pulmonary disease) (HCC)    Depression    Dyspnea    Edema    GERD (gastroesophageal reflux disease)    Gout    Hemorrhoids    Hyperlipidemia    Hypertension    Insomnia    a.) on hypnotic (zolpidem )   Long term current use of anticoagulant    a.) warfarin   OSA on CPAP    Pneumonia    Pulmonary HTN (HCC) 02/14/2016   a.) TTE 02/14/2016: RVSP 37.5 mmHg   Sick sinus syndrome (HCC)    Squamous cell carcinoma in situ 10/03/2009   L medial infrapectoral   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   L nose supratip   Stage 3b chronic kidney disease (CKD) (HCC)    Past Surgical History:  Procedure Laterality Date   CARDIOVERSION N/A 08/21/2004   Procedure: CIRECT CURRENT CARDIOVERSION; Location: [facility]; Surgeon: Starlette Ebbs, MD   CATARACT EXTRACTION, BILATERAL     CHOLECYSTECTOMY     COLONOSCOPY     IR EMBO TUMOR ORGAN ISCHEMIA INFARCT INC GUIDE ROADMAPPING  03/12/2023    IR RADIOLOGIST EVAL & MGMT  02/25/2023   IR RADIOLOGIST EVAL & MGMT  03/27/2023   IR RADIOLOGIST EVAL & MGMT  06/05/2023   NASAL SINUS SURGERY     Patient Active Problem List   Diagnosis Date Noted   Hyponatremia 12/19/2022   Sick sinus syndrome (HCC) 12/16/2022   Interstitial lung disease (HCC) 12/16/2022   Thoracic aortic aneurysm (HCC) 12/16/2022   Sepsis due to pneumonia (HCC) 12/16/2022   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 tophus 02/07/2022   Stage 3b chronic kidney disease (HCC) 02/07/2022   Hyperlipidemia 05/04/2015   Encounter for therapeutic drug monitoring 11/24/2013   Exertional dyspnea 11/23/2013   Chronic atrial fibrillation (HCC)    Pneumonia, organism unspecified(486) 08/17/2012    ONSET DATE: 2-3  REFERRING DIAG:  R26.81 (ICD-10-CM) - Unsteadiness on feet  R68.89 (ICD-10-CM) - Other general symptoms and signs  Z87.01 (ICD-10-CM) - Personal history of pneumonia (recurrent)    THERAPY DIAG:  Abnormality of gait and mobility  Muscle weakness (generalized)  Unsteadiness on feet  Difficulty in walking, not elsewhere classified  Other abnormalities of gait and mobility  Other lack of coordination  Rationale for Evaluation and Treatment: Rehabilitation  SUBJECTIVE:                                                                                                                                                                                             SUBJECTIVE STATEMENT:  Wife apologizes for late arrival to therapy session.  Pt reports he is doing "alright" and jokingly reports feeling "hungry." When asked about his L eye, pt jokingly states "I can still see you." Pt's wife reports they are going to an eye doctor today to have it checked out. Pt denies pain. Denies falls, but pt's wife reports he does still "stumble around."  Pt reports he uses his [facility] "most of the time" but sometimes will ambulate in the home without it.   Pt accompanied by:   wife- Craig Gilmore   PERTINENT HISTORY: Patient is a 89 year old male with referall from PCP for imbalance and past medical history includes a recent hospitalization- 12/16/2022- 5 days for sepsis and PNA. PMH includes renal lesion, A-Fib, COPD, Depression, HTN, GOUT, Sleep apnea,  and HLD.  Wife reports he has been having balance issues for 2-3 years but having more unsteadiness recently  PAIN:  Are you having pain? No  PRECAUTIONS: Fall  RED FLAGS: None   WEIGHT BEARING RESTRICTIONS: No  FALLS: Has patient fallen in last 6 months?  No falls but several near falls.   LIVING ENVIRONMENT: Lives with: lives with their family- wife (son stays during the day)  Lives in: House/apartment Stairs: Yes: External: 3 steps; on right going up Has following equipment at home: Single point cane, Rollator  PLOF: Requires assistive device for independence and Needs assistance with ADLs  PATIENT GOALS: To improve my balance and not fall  OBJECTIVE:  Note: Objective measures were completed at Evaluation unless otherwise noted.  DIAGNOSTIC FINDINGS:  CT SCAN- 12/16/2022 IMPRESSION: 1. No evidence for aortic dissection or aneurysm. 2. Ascending thoracic aortic aneurysm measuring 4.3 x 3.7 cm. Recommend annual imaging followup by CTA or MRA. This recommendation follows 2010 ACCF/AHA/AATS/ACR/ASA/SCA/SCAI/SIR/STS/SVM Guidelines for the Diagnosis and Management of Patients with Thoracic Aortic Disease. Circulation. 2010; 121: Q034-V425. Aortic aneurysm NOS (ICD10-I71.9) 3. Pathologic mediastinal and right hilar lymphadenopathy of uncertain etiology. 4. Findings compatible with interstitial lung disease. 5. Ground-glass opacity in the right lower lobe measuring 3.3 cm. This may be infectious/inflammatory, but neoplasm can not be excluded. 6. 4.8 cm indeterminate right renal lesion, possibly complex cysts. Recommend further evaluation with dedicated CT or MRI. 7. Bladder diverticulum. 8. Colonic  diverticulosis. 9. Prostatomegaly. 10. Small fat containing left inguinal hernia.     Electronically Signed   By: Egbert Grass  Naaman Au M.D.   On: 12/16/2022 23:30  COGNITION: Overall cognitive status: Impaired- forgetful   SENSATION: WFL  COORDINATION: Delayed at times with BLE  EDEMA:  None observed   POSTURE: rounded shoulders and forward head    LOWER EXTREMITY MMT:    MMT Right Eval Left Eval  Hip flexion 4 4  Hip extension    Hip abduction 4 4  Hip adduction 4 4  Hip internal rotation 4 4  Hip external rotation 4 4  Knee flexion 4 4  Knee extension 4 4  Ankle dorsiflexion 4 4  Ankle plantarflexion    Ankle inversion    Ankle eversion    (Blank rows = not tested)   TRANSFERS: Assistive device utilized: Single point cane  Sit to stand: Modified independence Stand to sit: Modified independence Chair to chair: Modified independence Floor:  Not tested   GAIT: Gait pattern: decreased arm swing- Right, decreased arm swing- Left, decreased step length- Right, decreased step length- Left, decreased stance time- Right, decreased stance time- Left, and decreased stride length Distance walked: > 200 feet in total Assistive device utilized: Single point cane Level of assistance: CGA Comments: decreased gait speed and VC to pick up feet  FUNCTIONAL TESTS:  5 times sit to stand: 22.68 sec withuout  Timed up and go (TUG): 13.99 sec with cane 6 minute walk test: To be performed next visit 10 meter walk test: 0.59 m/s Berg Balance Scale: 45/56  PATIENT SURVEYS:  FOTO 54  TODAY'S TREATMENT:                                                                                                                              DATE: 08/05/23  Pt's wife reports pt primarily stumbles/has balance instability towards L; however, during session noticed pt with tendency to have R posterior LOB primarily.  Dynamic gait training~292ft, no AD,  with dual-task challenge of head  rotations to identify targets on wall - CGA for safety/steadying with imbalance, but no significant LOB. Pt does have difficult time finding higher placed targets due to excessive cervical flexion with downward gaze.  Dynamic balance challenges including:  Alternating foot taps to green step with 1x purple plate CGA for steadying with overall good balance, noticed pt does keep wider BOS during this for stability Progressed to forward step-ups on green step with 1x purple plate, no UE support Requires CGA/light min A for steadying, but no significant LOB Progressed to same step-ups with added dual-task of 4 Blaze Pods on mirror in front of pt to visually scan and reach while maintaining balance on step Requires intermittent heavy min A for balance due to posterior LOB Forward/backwards stepping over 1/2 foam roll  Pt initially trying to step around foam roll rather than over it when stepping backwards Requires consistent min A with 2x heavy max A to prevent posterior LOB as pt starts to have posterior momentum but doesn't step his foot quickly enough  This is a great challenge for patient! Improved balance on 2nd repetition, but pt continues to have increased difficulty stepping back leading with R LE Side stepping over 1/2 foam roll x10 reps with pt having good balance with this Progressed to adding dual-task of 4 Blaze Pods (2 placed on tables on either side of patient) with goal of reaching with contralateral UE to reach across body and rotate trunk to hit target 2 min Requires consistent CGA with 3x heavy min/mod A to regain balance due to R LOB bias   **Pt would benefit from interventions targeting posterior stepping balance strategy especially with R LE as pt doesn't abduct it enough causing R posterior LOB   PATIENT EDUCATION: Education details: PT plan of care; goals Person educated: Patient Education method: Explanation Education comprehension: verbalized understanding  HOME  EXERCISE PROGRAM:  Access Code: [email or web address] URL: [email or web address] Date: 03/20/2023 Prepared by: Ferrell Hu  Exercises - Scapular Retraction with Resistance  - 1 x daily - 3 sets - 10 reps - Shoulder extension with resistance - Neutral  - 1 x daily - 7 x weekly - 3 sets - 10 reps   Access Code: [email or web address] URL: [email or web address] Date: 02/04/2023 Prepared by: Marina  Moser  Exercises - Seated March  - 1 x daily - 7 x weekly - 2 sets - 10 reps - 5 hold - Leg Extension  - 1 x daily - 7 x weekly - 2 sets - 10 reps - 3 hold - Seated Hip Abduction with Resistance  - 1 x daily - 7 x weekly - 2 sets - 10 reps - 5 hold  GOALS: Goals reviewed with patient? Yes  SHORT TERM GOALS: Target date: 03/13/2023  Pt will be independent with HEP in order to improve strength and balance in order to decrease fall risk and improve function at home and work.  Baseline: EVAL: No formal HEP in place; 05/06/2023= Patient reports knowledgeable of HEP but states has not been very compliant. 4/22: intermittent compliance  Goal status: Partially Met    LONG TERM GOALS: Target date: 10/21/2023   1.  Patient (> 21 years old) will complete five times sit to stand test in < 15 seconds indicating an increased LE strength and improved balance. Baseline: EVAL: 22.68 without UE support; 05/06/2023= 16.79 sec without UE support 3/20: 11 seconds no hands Goal status: MET  2.  Patient will increase FOTO score to equal to or greater than  55   to demonstrate statistically significant improvement in mobility and quality of life.  Baseline: EVAL=54; 05/06/2023=62 Goal status: MET   3.  Patient will increase Berg Balance score by > 4 points to demonstrate decreased fall risk during functional activities. Baseline: 45; 05/06/2023= 47/56 3/20: 48/56 4/22: 50/56 Goal status: MET/PROGRESSED   4.  Patient will reduce timed up and go to <11 seconds to reduce fall risk and demonstrate  improved transfer/gait ability. Baseline: EVAL=13.99 sec with cane; 05/06/2023= 13.01 sec without a cane. 3/20: 11.71 seconds no AD 4/22: 10 seconds Goal status: MET  5.  Patient will increase 10 meter walk test to >1.21m/s as to improve gait speed for better community ambulation and to reduce fall risk. Baseline: EVAL= 0.58 m/s; 05/06/2023= 0.75 m/s without an AD 3/20: 1.2 m/s no AD  Goal status: MET  6.  Patient will increase six minute walk test distance to >1000 for progression to community ambulator and improve gait ability Baseline: EVAL: to be assessed 2nd visit 10/29: 715 ft; 05/06/2023= 750 feet  in 3 min 54 sec without AD 3/20: 925 ft with [facility] 4/22: 930 ft with [facility]  Goal status: PROGRESSING   7.  Patient will participate and attend silver sneaker classes weekly to increase independent mobility and strength. Baseline:new goal;  Goal status: NEW   ASSESSMENT:  CLINICAL IMPRESSION:  Patient arrives motivated to participate in therapy session with focus on progressively higher level dynamic standing balance interventions. Patient tolerated addition of dual-task challenges during balance interventions and would benefit from continuation of this. Throughout interventions, pt demos RIGHT posterior LOB bias and would benefit from training R posterior stepping balance recovery strategy. Pt will continue to benefit from skilled physical therapy intervention to address impairments, decrease fall risk, improve QOL, and attain therapy goals      OBJECTIVE IMPAIRMENTS: Abnormal gait, decreased activity tolerance, decreased balance, decreased cognition, decreased coordination, decreased endurance, decreased mobility, difficulty walking, decreased strength, and impaired flexibility.   ACTIVITY LIMITATIONS: carrying, lifting, bending, sitting, standing, squatting, stairs, and transfers  PARTICIPATION LIMITATIONS: cleaning, laundry, shopping, community activity, and yard work  PERSONAL FACTORS: Age  and 1-2 comorbidities: COPD, HTN  are also affecting patient's functional outcome.   REHAB POTENTIAL: Good  CLINICAL DECISION MAKING: Evolving/moderate complexity  EVALUATION COMPLEXITY: Moderate  PLAN:  PT FREQUENCY: 1-2x/week  PT DURATION: 12 weeks  PLANNED INTERVENTIONS: 97164- PT Re-evaluation, 97110-Therapeutic exercises, 97530- Therapeutic activity, 97112- Neuromuscular re-education, 97535- Self Care, 16109- Manual therapy, [id number]- Gait training, [phone]- Orthotic Fit/training, [phone]- Canalith repositioning, [phone]- Electrical stimulation (manual), Patient/Family education, Balance training, Stair training, Taping, Dry Needling, Joint mobilization, Spinal mobilization, Vestibular training, Visual/preceptual remediation/compensation, DME instructions, Cryotherapy, and Moist heat  PLAN FOR NEXT SESSION:   - follow-up on compliance with Silver Sneakers recommendation - dynamic balance interventions focused on posterior stepping balance strategy with focus on preventing R posterior LOB Continue with balance training, and LE strengthening/conditioning to improve his overall functional endurance.    Carlen Chasten, PT, DPT, NCS, CSRS Physical Therapist - [place]  [facility]  1:04 PM 08/05/23

## 2023-08-06 NOTE — Therapy (Signed)
 OUTPATIENT PHYSICAL THERAPY NEURO TREATMENT   Patient Name: Craig Gilmore MRN: [id number] DOB:Jun 14, 1934, 89 y.o., male Today's Date: 08/07/2023   PCP: Dr. Lyle San   REFERRING PROVIDER: Dr. Lyle San  END OF SESSION:  PT End of Session - 08/07/23 1145     Visit Number 28    Number of Visits 50   corrected   Date for PT Re-Evaluation 10/21/23    PT Start Time 1145    PT Stop Time 1229    PT Time Calculation (min) 44 min    Equipment Utilized During Treatment Gait belt    Activity Tolerance Patient tolerated treatment well    Behavior During Therapy WFL for tasks assessed/performed              Past Medical History:  Diagnosis Date   Chronic atrial fibrillation (HCC)    a.) CHA2DS2VASc = 3 (age x 2, HTN);  b.) s/p DCCV 08/20/2004 --> 50 J x 1 and 100 J x 1 --> coverted with SB. (+) post-procedure nausea; c.) rate/rhythm maintained without pharmacological intervention; chronically anticoagulated with warfarin   Colonic polyp    COPD (chronic obstructive pulmonary disease) (HCC)    Depression    Dyspnea    Edema    GERD (gastroesophageal reflux disease)    Gout    Hemorrhoids    Hyperlipidemia    Hypertension    Insomnia    a.) on hypnotic (zolpidem )   Long term current use of anticoagulant    a.) warfarin   OSA on CPAP    Pneumonia    Pulmonary HTN (HCC) 02/14/2016   a.) TTE 02/14/2016: RVSP 37.5 mmHg   Sick sinus syndrome (HCC)    Squamous cell carcinoma in situ 10/03/2009   L medial infrapectoral   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   L nose supratip   Stage 3b chronic kidney disease (CKD) (HCC)    Past Surgical History:  Procedure Laterality Date   CARDIOVERSION N/A 08/21/2004   Procedure: CIRECT CURRENT CARDIOVERSION; Location: [facility]; Surgeon: Starlette Ebbs, MD   CATARACT EXTRACTION, BILATERAL     CHOLECYSTECTOMY     COLONOSCOPY     IR EMBO TUMOR ORGAN ISCHEMIA INFARCT INC GUIDE ROADMAPPING  03/12/2023   IR RADIOLOGIST EVAL & MGMT   02/25/2023   IR RADIOLOGIST EVAL & MGMT  03/27/2023   IR RADIOLOGIST EVAL & MGMT  06/05/2023   NASAL SINUS SURGERY     Patient Active Problem List   Diagnosis Date Noted   Hyponatremia 12/19/2022   Sick sinus syndrome (HCC) 12/16/2022   Interstitial lung disease (HCC) 12/16/2022   Thoracic aortic aneurysm (HCC) 12/16/2022   Sepsis due to pneumonia (HCC) 12/16/2022   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 tophus 02/07/2022   Stage 3b chronic kidney disease (HCC) 02/07/2022   Hyperlipidemia 05/04/2015   Encounter for therapeutic drug monitoring 11/24/2013   Exertional dyspnea 11/23/2013   Chronic atrial fibrillation (HCC)    Pneumonia, organism unspecified(486) 08/17/2012    ONSET DATE: 2-3  REFERRING DIAG:  R26.81 (ICD-10-CM) - Unsteadiness on feet  R68.89 (ICD-10-CM) - Other general symptoms and signs  Z87.01 (ICD-10-CM) - Personal history of pneumonia (recurrent)    THERAPY DIAG:  Abnormality of gait and mobility  Muscle weakness (generalized)  Unsteadiness on feet  Difficulty in walking, not elsewhere classified  Rationale for Evaluation and Treatment: Rehabilitation  SUBJECTIVE:  SUBJECTIVE STATEMENT:  Patient reports no falls or stumbles.    Pt accompanied by:  wife- Kerney Pee   PERTINENT HISTORY: Patient is a 89 year old male with referall from PCP for imbalance and past medical history includes a recent hospitalization- 12/16/2022- 5 days for sepsis and PNA. PMH includes renal lesion, A-Fib, COPD, Depression, HTN, GOUT, Sleep apnea,  and HLD.  Wife reports he has been having balance issues for 2-3 years but having more unsteadiness recently  PAIN:  Are you having pain? No  PRECAUTIONS: Fall  RED FLAGS: None   WEIGHT BEARING RESTRICTIONS: No  FALLS: Has patient fallen in  last 6 months?  No falls but several near falls.   LIVING ENVIRONMENT: Lives with: lives with their family- wife (son stays during the day)  Lives in: House/apartment Stairs: Yes: External: 3 steps; on right going up Has following equipment at home: Single point cane, Rollator  PLOF: Requires assistive device for independence and Needs assistance with ADLs  PATIENT GOALS: To improve my balance and not fall  OBJECTIVE:  Note: Objective measures were completed at Evaluation unless otherwise noted.  DIAGNOSTIC FINDINGS:  CT SCAN- 12/16/2022 IMPRESSION: 1. No evidence for aortic dissection or aneurysm. 2. Ascending thoracic aortic aneurysm measuring 4.3 x 3.7 cm. Recommend annual imaging followup by CTA or MRA. This recommendation follows 2010 ACCF/AHA/AATS/ACR/ASA/SCA/SCAI/SIR/STS/SVM Guidelines for the Diagnosis and Management of Patients with Thoracic Aortic Disease. Circulation. 2010; 121: Z610-R604. Aortic aneurysm NOS (ICD10-I71.9) 3. Pathologic mediastinal and right hilar lymphadenopathy of uncertain etiology. 4. Findings compatible with interstitial lung disease. 5. Ground-glass opacity in the right lower lobe measuring 3.3 cm. This may be infectious/inflammatory, but neoplasm can not be excluded. 6. 4.8 cm indeterminate right renal lesion, possibly complex cysts. Recommend further evaluation with dedicated CT or MRI. 7. Bladder diverticulum. 8. Colonic diverticulosis. 9. Prostatomegaly. 10. Small fat containing left inguinal hernia.     Electronically Signed   By: Tyron Gallon M.D.   On: 12/16/2022 23:30  COGNITION: Overall cognitive status: Impaired- forgetful   SENSATION: WFL  COORDINATION: Delayed at times with BLE  EDEMA:  None observed   POSTURE: rounded shoulders and forward head    LOWER EXTREMITY MMT:    MMT Right Eval Left Eval  Hip flexion 4 4  Hip extension    Hip abduction 4 4  Hip adduction 4 4  Hip internal rotation 4 4  Hip  external rotation 4 4  Knee flexion 4 4  Knee extension 4 4  Ankle dorsiflexion 4 4  Ankle plantarflexion    Ankle inversion    Ankle eversion    (Blank rows = not tested)   TRANSFERS: Assistive device utilized: Single point cane  Sit to stand: Modified independence Stand to sit: Modified independence Chair to chair: Modified independence Floor:  Not tested   GAIT: Gait pattern: decreased arm swing- Right, decreased arm swing- Left, decreased step length- Right, decreased step length- Left, decreased stance time- Right, decreased stance time- Left, and decreased stride length Distance walked: > 200 feet in total Assistive device utilized: Single point cane Level of assistance: CGA Comments: decreased gait speed and VC to pick up feet  FUNCTIONAL TESTS:  5 times sit to stand: 22.68 sec withuout  Timed up and go (TUG): 13.99 sec with cane 6 minute walk test: To be performed next visit 10 meter walk test: 0.59 m/s Berg Balance Scale: 45/56  PATIENT SURVEYS:  FOTO 54  TODAY'S TREATMENT:  DATE: 08/07/23  THERAPEUTIC ACTIVITIES: To improve functional movements patterns for everyday tasks   Superset: three sets  Ambulate 150 ft no AD with close CGA; difficulty with doorframes; with 2lb AW 10 x STS    6" step: -toe taps 10x each LE -step up/down 10x each LE -lateral step up/down 10x each LE  Standing at bar: -large step forward/backwards 10x each LE -heel raises 15x  TE- To improve strength, endurance, mobility, and function of specific targeted muscle groups or improve joint range of motion or improve muscle flexibility   Adduction ball squeeze 15x Adductions squeeze with LAQ 15x Adduction squeeze with heel raise 15x  Seated BTB hamstring curl 15x each LE BTB abduction 20x each   **Pt would benefit from interventions targeting posterior  stepping balance strategy especially with R LE as pt doesn't abduct it enough causing R posterior LOB   PATIENT EDUCATION: Education details: PT plan of care; goals Person educated: Patient Education method: Explanation Education comprehension: verbalized understanding  HOME EXERCISE PROGRAM:  Access Code: [email or web address] URL: [email or web address] Date: 03/20/2023 Prepared by: Ferrell Hu  Exercises - Scapular Retraction with Resistance  - 1 x daily - 3 sets - 10 reps - Shoulder extension with resistance - Neutral  - 1 x daily - 7 x weekly - 3 sets - 10 reps   Access Code: [email or web address] URL: [email or web address] Date: 02/04/2023 Prepared by: Shanoah Asbill  Exercises - Seated March  - 1 x daily - 7 x weekly - 2 sets - 10 reps - 5 hold - Leg Extension  - 1 x daily - 7 x weekly - 2 sets - 10 reps - 3 hold - Seated Hip Abduction with Resistance  - 1 x daily - 7 x weekly - 2 sets - 10 reps - 5 hold  GOALS: Goals reviewed with patient? Yes  SHORT TERM GOALS: Target date: 03/13/2023  Pt will be independent with HEP in order to improve strength and balance in order to decrease fall risk and improve function at home and work.  Baseline: EVAL: No formal HEP in place; 05/06/2023= Patient reports knowledgeable of HEP but states has not been very compliant. 4/22: intermittent compliance  Goal status: Partially Met    LONG TERM GOALS: Target date: 10/21/2023   1.  Patient (> 62 years old) will complete five times sit to stand test in < 15 seconds indicating an increased LE strength and improved balance. Baseline: EVAL: 22.68 without UE support; 05/06/2023= 16.79 sec without UE support 3/20: 11 seconds no hands Goal status: MET  2.  Patient will increase FOTO score to equal to or greater than  55   to demonstrate statistically significant improvement in mobility and quality of life.  Baseline: EVAL=54; 05/06/2023=62 Goal status: MET   3.  Patient will increase  Berg Balance score by > 4 points to demonstrate decreased fall risk during functional activities. Baseline: 45; 05/06/2023= 47/56 3/20: 48/56 4/22: 50/56 Goal status: MET/PROGRESSED   4.  Patient will reduce timed up and go to <11 seconds to reduce fall risk and demonstrate improved transfer/gait ability. Baseline: EVAL=13.99 sec with cane; 05/06/2023= 13.01 sec without a cane. 3/20: 11.71 seconds no AD 4/22: 10 seconds Goal status: MET  5.  Patient will increase 10 meter walk test to >1.35m/s as to improve gait speed for better community ambulation and to reduce fall risk. Baseline: EVAL= 0.58 m/s; 05/06/2023= 0.75 m/s without an AD 3/20: 1.2 m/s no AD  Goal status: MET  6.  Patient  will increase six minute walk test distance to >1000 for progression to community ambulator and improve gait ability Baseline: EVAL: to be assessed 2nd visit 10/29: 715 ft; 05/06/2023= 750 feet in 3 min 54 sec without AD 3/20: 925 ft with [facility] 4/22: 930 ft with SPC  Goal status: PROGRESSING   7.  Patient will participate and attend silver sneaker classes weekly to increase independent mobility and strength. Baseline:new goal;  Goal status: NEW   ASSESSMENT:  CLINICAL IMPRESSION:  Patient presents with excellent motivation. He has improved gait speed and foot clearance with use of 2lb ankle weights. He is eager to progress his functional mobility. Stairs reported to be less difficult this session indicating carryover and strengthening between sessions. Pt will continue to benefit from skilled physical therapy intervention to address impairments, decrease fall risk, improve QOL, and attain therapy goals      OBJECTIVE IMPAIRMENTS: Abnormal gait, decreased activity tolerance, decreased balance, decreased cognition, decreased coordination, decreased endurance, decreased mobility, difficulty walking, decreased strength, and impaired flexibility.   ACTIVITY LIMITATIONS: carrying, lifting, bending, sitting, standing,  squatting, stairs, and transfers  PARTICIPATION LIMITATIONS: cleaning, laundry, shopping, community activity, and yard work  PERSONAL FACTORS: Age and 1-2 comorbidities: COPD, HTN  are also affecting patient's functional outcome.   REHAB POTENTIAL: Good  CLINICAL DECISION MAKING: Evolving/moderate complexity  EVALUATION COMPLEXITY: Moderate  PLAN:  PT FREQUENCY: 1-2x/week  PT DURATION: 12 weeks  PLANNED INTERVENTIONS: 97164- PT Re-evaluation, 97110-Therapeutic exercises, 97530- Therapeutic activity, 97112- Neuromuscular re-education, 97535- Self Care, 14782- Manual therapy, [id number]- Gait training, [phone]- Orthotic Fit/training, [phone]- Canalith repositioning, [phone]- Electrical stimulation (manual), Patient/Family education, Balance training, Stair training, Taping, Dry Needling, Joint mobilization, Spinal mobilization, Vestibular training, Visual/preceptual remediation/compensation, DME instructions, Cryotherapy, and Moist heat  PLAN FOR NEXT SESSION:   - follow-up on compliance with Silver Sneakers recommendation - dynamic balance interventions focused on posterior stepping balance strategy with focus on preventing R posterior LOB Continue with balance training, and LE strengthening/conditioning to improve his overall functional endurance.    Xcaret Morad  Brain Cahill PT, DPT Physical Therapist - [facility]  Outpatient Physical Therapy- Main Campus [phone]    12:29 PM 08/07/23

## 2023-08-07 ENCOUNTER — Ambulatory Visit: Payer: No Typology Code available for payment source | Attending: Family Medicine

## 2023-08-07 DIAGNOSIS — M6281 Muscle weakness (generalized): Secondary | ICD-10-CM | POA: Diagnosis not present

## 2023-08-07 DIAGNOSIS — R2681 Unsteadiness on feet: Secondary | ICD-10-CM | POA: Insufficient documentation

## 2023-08-07 DIAGNOSIS — R262 Difficulty in walking, not elsewhere classified: Secondary | ICD-10-CM | POA: Insufficient documentation

## 2023-08-07 DIAGNOSIS — R269 Unspecified abnormalities of gait and mobility: Secondary | ICD-10-CM | POA: Diagnosis not present

## 2023-08-12 ENCOUNTER — Ambulatory Visit: Payer: No Typology Code available for payment source

## 2023-08-12 ENCOUNTER — Ambulatory Visit (INDEPENDENT_AMBULATORY_CARE_PROVIDER_SITE_OTHER): Admitting: Physician Assistant

## 2023-08-12 VITALS — BP 155/76 | HR 71 | Ht 67.0 in | Wt 216.0 lb

## 2023-08-12 DIAGNOSIS — R3914 Feeling of incomplete bladder emptying: Secondary | ICD-10-CM | POA: Diagnosis not present

## 2023-08-12 DIAGNOSIS — N401 Enlarged prostate with lower urinary tract symptoms: Secondary | ICD-10-CM

## 2023-08-12 DIAGNOSIS — I482 Chronic atrial fibrillation, unspecified: Secondary | ICD-10-CM | POA: Diagnosis not present

## 2023-08-12 DIAGNOSIS — Z7901 Long term (current) use of anticoagulants: Secondary | ICD-10-CM | POA: Diagnosis not present

## 2023-08-12 DIAGNOSIS — Z85828 Personal history of other malignant neoplasm of skin: Secondary | ICD-10-CM | POA: Diagnosis not present

## 2023-08-12 LAB — BLADDER SCAN AMB NON-IMAGING: Scan Result: 521

## 2023-08-12 NOTE — Progress Notes (Signed)
 08/12/2023 2:42 PM   Craig Gilmore 03-20-1935 [id number]  CC: Chief Complaint  Patient presents with   Urinary Retention   HPI: Craig Gilmore is a 89 y.o. male with PMH memory impairment and BPH s/p PAE in December 2024 with chronic incomplete bladder emptying who presents today for follow-up.   Today he reports no acute concerns, no abdominal or bladder discomfort, no UTIs.  He recently fell and cut his arm and is awaiting an appointment with his PCP later this week.  Renal ultrasound dated 07/22/2023 showed no hydronephrosis with a significant PVR of 548 mL, previously 392 mL.  PVR today 521 mL.  PMH: Past Medical History:  Diagnosis Date   Chronic atrial fibrillation (HCC)    a.) CHA2DS2VASc = 3 (age x 2, HTN);  b.) s/p DCCV 08/20/2004 --> 50 J x 1 and 100 J x 1 --> coverted with SB. (+) post-procedure nausea; c.) rate/rhythm maintained without pharmacological intervention; chronically anticoagulated with warfarin   Colonic polyp    COPD (chronic obstructive pulmonary disease) (HCC)    Depression    Dyspnea    Edema    GERD (gastroesophageal reflux disease)    Gout    Hemorrhoids    Hyperlipidemia    Hypertension    Insomnia    a.) on hypnotic (zolpidem )   Long term current use of anticoagulant    a.) warfarin   OSA on CPAP    Pneumonia    Pulmonary HTN (HCC) 02/14/2016   a.) TTE 02/14/2016: RVSP 37.5 mmHg   Sick sinus syndrome (HCC)    Squamous cell carcinoma in situ 10/03/2009   L medial infrapectoral   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   L nose supratip   Stage 3b chronic kidney disease (CKD) (HCC)     Surgical History: Past Surgical History:  Procedure Laterality Date   CARDIOVERSION N/A 08/21/2004   Procedure: CIRECT CURRENT CARDIOVERSION; Location: [facility]; Surgeon: Starlette Ebbs, MD   CATARACT EXTRACTION, BILATERAL     CHOLECYSTECTOMY     COLONOSCOPY     IR EMBO TUMOR ORGAN ISCHEMIA INFARCT INC GUIDE ROADMAPPING  03/12/2023   IR RADIOLOGIST  EVAL & MGMT  02/25/2023   IR RADIOLOGIST EVAL & MGMT  03/27/2023   IR RADIOLOGIST EVAL & MGMT  06/05/2023   NASAL SINUS SURGERY      Home Medications:  Allergies as of 08/12/2023       Reactions   Baycol [cerivastatin]    Pravastatin         Medication List        Accurate as of Aug 12, 2023  2:42 PM. If you have any questions, ask your nurse or doctor.          albuterol 108 (90 Base) MCG/ACT inhaler Commonly known as: VENTOLIN HFA Inhale 2 puffs into the lungs 2 (two) times daily.   amLODipine 5 MG tablet Commonly known as: NORVASC Take 5 mg by mouth daily.   azithromycin  250 MG tablet Commonly known as: ZITHROMAX  Take 250 mg by mouth daily.   buPROPion  150 MG 24 hr tablet Commonly known as: WELLBUTRIN  XL Take 75 mg by mouth every morning.   dutasteride  0.5 MG capsule Commonly known as: AVODART  Take 1 capsule (0.5 mg total) by mouth daily.   febuxostat  40 MG tablet Commonly known as: ULORIC  Take 40 mg by mouth every evening.   Fenofibric Acid  135 MG Cpdr Take 1 capsule by mouth  daily What changed:  how much  to take how to take this when to take this   fluticasone  50 MCG/ACT nasal spray Commonly known as: FLONASE  Place 1 spray into both nostrils at bedtime.   furosemide  20 MG tablet Commonly known as: LASIX  Take 1 tablet (20 mg total) by mouth daily as needed for fluid or edema (Take 1 tablet for worsening leg swelling, shortness of breath, or weight gain of greater than 3 lbs.).   ipratropium 0.03 % nasal spray Commonly known as: ATROVENT  Place 2 sprays into both nostrils every morning.   levocetirizine 5 MG tablet Commonly known as: XYZAL  Take 5 mg by mouth every evening.   omeprazole  20 MG tablet Commonly known as: PRILOSEC  OTC Take 20 mg by mouth daily before breakfast.   sertraline  100 MG tablet Commonly known as: ZOLOFT  Take 150 mg by mouth every morning.   tamsulosin  0.4 MG Caps capsule Commonly known as: Flomax  Take 1 capsule  (0.4 mg total) by mouth daily after supper.   warfarin 2.5 MG tablet Commonly known as: COUMADIN  Take 1 tablet (2.5 mg total) by mouth every evening. Take as directed by anticoagulation clinic   zolpidem  10 MG tablet Commonly known as: AMBIEN  Take 10 mg by mouth at bedtime.        Allergies:  Allergies  Allergen Reactions   Baycol [Cerivastatin]    Pravastatin     Family History: Family History  Problem Relation Age of Onset   Diabetes Mother    Heart disease Mother    Heart disease Father    Aneurysm Father     Social History:   reports that he quit smoking about 34 years ago. His smoking use included cigarettes. He started smoking about 84 years ago. He has a 75 pack-year smoking history. He has been exposed to tobacco smoke. He has never used smokeless tobacco. He reports that he does not drink alcohol and does not use drugs.  Physical Exam: BP (!) 155/76   Pulse 71   Ht 5\' 7"  (1.702 m)   Wt 216 lb (98 kg)   BMI 33.83 kg/m   Constitutional:  Alert and oriented, no acute distress, nontoxic appearing HEENT: [place], AT Cardiovascular: No clubbing, cyanosis, or edema Respiratory: Normal respiratory effort, no increased work of breathing Skin: No rashes, bruises or suspicious lesions Neurologic: Grossly intact, no focal deficits, moving all 4 extremities Psychiatric: Normal mood and affect  Laboratory Data: Results for orders placed or performed in visit on 08/12/23  Bladder Scan (Post Void Residual) in office   Collection Time: 08/12/23  2:41 PM  Result Value Ref Range   Scan Result 521    Assessment & Plan:   1. Benign prostatic hyperplasia with incomplete bladder emptying (Primary) PVR increased over prior, however his renal function is stable, no hydronephrosis, and not having recurrent UTIs.  He is comfortable and asymptomatic of this chronic retention.  Using shared decision making, we elected to proceed with conservative management with every 58-month renal  ultrasound and BMP.  We discussed that indications for more aggressive intervention would include recurrent UTI, rising creatinine, new hydronephrosis, or abdominal discomfort/fullness. - Bladder Scan (Post Void Residual) in office - Basic metabolic panel   Return in about 6 months (around 02/12/2024) for Follow up with RUS prior + BMP.  Kathreen Pare, PA-C  [facility] [place] [address] [place], [place] [address] [phone]

## 2023-08-13 LAB — BASIC METABOLIC PANEL WITH GFR
BUN/Creatinine Ratio: 16 (ref 10–24)
BUN: 31 mg/dL — ABNORMAL HIGH (ref 8–27)
CO2: 20 mmol/L (ref 20–29)
Calcium: 9.3 mg/dL (ref 8.6–10.2)
Chloride: 105 mmol/L (ref 96–106)
Creatinine, Ser: 1.88 mg/dL — ABNORMAL HIGH (ref 0.76–1.27)
Glucose: 120 mg/dL — ABNORMAL HIGH (ref 70–99)
Potassium: 4.8 mmol/L (ref 3.5–5.2)
Sodium: 139 mmol/L (ref 134–144)
eGFR: 34 mL/min/{1.73_m2} — ABNORMAL LOW (ref >59)

## 2023-08-14 ENCOUNTER — Ambulatory Visit: Payer: No Typology Code available for payment source

## 2023-08-15 DIAGNOSIS — Y92009 Unspecified place in unspecified non-institutional (private) residence as the place of occurrence of the external cause: Secondary | ICD-10-CM | POA: Diagnosis not present

## 2023-08-15 DIAGNOSIS — T148XXA Other injury of unspecified body region, initial encounter: Secondary | ICD-10-CM | POA: Diagnosis not present

## 2023-08-15 DIAGNOSIS — W19XXXA Unspecified fall, initial encounter: Secondary | ICD-10-CM | POA: Diagnosis not present

## 2023-08-15 DIAGNOSIS — R234 Changes in skin texture: Secondary | ICD-10-CM | POA: Diagnosis not present

## 2023-08-19 ENCOUNTER — Ambulatory Visit: Payer: No Typology Code available for payment source

## 2023-08-19 DIAGNOSIS — H02006 Unspecified entropion of left eye, unspecified eyelid: Secondary | ICD-10-CM | POA: Diagnosis not present

## 2023-08-19 DIAGNOSIS — Z961 Presence of intraocular lens: Secondary | ICD-10-CM | POA: Diagnosis not present

## 2023-08-21 ENCOUNTER — Ambulatory Visit: Payer: No Typology Code available for payment source

## 2023-08-28 ENCOUNTER — Ambulatory Visit

## 2023-08-28 DIAGNOSIS — H02006 Unspecified entropion of left eye, unspecified eyelid: Secondary | ICD-10-CM | POA: Diagnosis not present

## 2023-08-28 DIAGNOSIS — H02132 Senile ectropion of right lower eyelid: Secondary | ICD-10-CM | POA: Diagnosis not present

## 2023-08-28 DIAGNOSIS — H189 Unspecified disorder of cornea: Secondary | ICD-10-CM | POA: Diagnosis not present

## 2023-09-02 DIAGNOSIS — I361 Nonrheumatic tricuspid (valve) insufficiency: Secondary | ICD-10-CM | POA: Diagnosis not present

## 2023-09-02 DIAGNOSIS — N1832 Chronic kidney disease, stage 3b: Secondary | ICD-10-CM | POA: Diagnosis not present

## 2023-09-02 DIAGNOSIS — I482 Chronic atrial fibrillation, unspecified: Secondary | ICD-10-CM | POA: Diagnosis not present

## 2023-09-02 DIAGNOSIS — I340 Nonrheumatic mitral (valve) insufficiency: Secondary | ICD-10-CM | POA: Diagnosis not present

## 2023-09-02 DIAGNOSIS — R001 Bradycardia, unspecified: Secondary | ICD-10-CM | POA: Diagnosis not present

## 2023-09-02 DIAGNOSIS — R296 Repeated falls: Secondary | ICD-10-CM | POA: Diagnosis not present

## 2023-09-02 DIAGNOSIS — I10 Essential (primary) hypertension: Secondary | ICD-10-CM | POA: Diagnosis not present

## 2023-09-02 DIAGNOSIS — E782 Mixed hyperlipidemia: Secondary | ICD-10-CM | POA: Diagnosis not present

## 2023-09-11 DIAGNOSIS — Z7901 Long term (current) use of anticoagulants: Secondary | ICD-10-CM | POA: Diagnosis not present

## 2023-09-11 DIAGNOSIS — I482 Chronic atrial fibrillation, unspecified: Secondary | ICD-10-CM | POA: Diagnosis not present

## 2023-09-17 DIAGNOSIS — I482 Chronic atrial fibrillation, unspecified: Secondary | ICD-10-CM | POA: Diagnosis not present

## 2023-09-17 DIAGNOSIS — Z7901 Long term (current) use of anticoagulants: Secondary | ICD-10-CM | POA: Diagnosis not present

## 2023-09-18 DIAGNOSIS — R001 Bradycardia, unspecified: Secondary | ICD-10-CM | POA: Diagnosis not present

## 2023-10-02 DIAGNOSIS — I10 Essential (primary) hypertension: Secondary | ICD-10-CM | POA: Diagnosis not present

## 2023-10-02 DIAGNOSIS — M25512 Pain in left shoulder: Secondary | ICD-10-CM | POA: Diagnosis not present

## 2023-10-02 DIAGNOSIS — G4733 Obstructive sleep apnea (adult) (pediatric): Secondary | ICD-10-CM | POA: Diagnosis not present

## 2023-10-02 DIAGNOSIS — M542 Cervicalgia: Secondary | ICD-10-CM | POA: Diagnosis not present

## 2023-10-02 DIAGNOSIS — I482 Chronic atrial fibrillation, unspecified: Secondary | ICD-10-CM | POA: Diagnosis not present

## 2023-10-14 ENCOUNTER — Ambulatory Visit: Admitting: Physical Therapy

## 2023-10-16 ENCOUNTER — Ambulatory Visit

## 2023-10-21 ENCOUNTER — Ambulatory Visit

## 2023-10-21 DIAGNOSIS — I482 Chronic atrial fibrillation, unspecified: Secondary | ICD-10-CM | POA: Diagnosis not present

## 2023-10-21 DIAGNOSIS — Z7901 Long term (current) use of anticoagulants: Secondary | ICD-10-CM | POA: Diagnosis not present

## 2023-10-23 ENCOUNTER — Ambulatory Visit: Admitting: Physical Therapy

## 2023-10-28 ENCOUNTER — Ambulatory Visit

## 2023-10-29 DIAGNOSIS — R0600 Dyspnea, unspecified: Secondary | ICD-10-CM | POA: Diagnosis not present

## 2023-10-29 DIAGNOSIS — E669 Obesity, unspecified: Secondary | ICD-10-CM | POA: Diagnosis not present

## 2023-10-29 DIAGNOSIS — I480 Paroxysmal atrial fibrillation: Secondary | ICD-10-CM | POA: Diagnosis not present

## 2023-10-29 DIAGNOSIS — Z79899 Other long term (current) drug therapy: Secondary | ICD-10-CM | POA: Diagnosis not present

## 2023-10-29 DIAGNOSIS — J849 Interstitial pulmonary disease, unspecified: Secondary | ICD-10-CM | POA: Diagnosis not present

## 2023-10-29 DIAGNOSIS — G4733 Obstructive sleep apnea (adult) (pediatric): Secondary | ICD-10-CM | POA: Diagnosis not present

## 2023-10-29 DIAGNOSIS — R0689 Other abnormalities of breathing: Secondary | ICD-10-CM | POA: Diagnosis not present

## 2023-10-30 ENCOUNTER — Ambulatory Visit

## 2023-10-30 DIAGNOSIS — H02132 Senile ectropion of right lower eyelid: Secondary | ICD-10-CM | POA: Diagnosis not present

## 2023-10-30 DIAGNOSIS — H02135 Senile ectropion of left lower eyelid: Secondary | ICD-10-CM | POA: Diagnosis not present

## 2023-11-07 DIAGNOSIS — M1A0790 Idiopathic chronic gout, unspecified ankle and foot, without tophus (tophi): Secondary | ICD-10-CM | POA: Diagnosis not present

## 2023-11-07 DIAGNOSIS — I480 Paroxysmal atrial fibrillation: Secondary | ICD-10-CM | POA: Diagnosis not present

## 2023-11-07 DIAGNOSIS — F32A Depression, unspecified: Secondary | ICD-10-CM | POA: Diagnosis not present

## 2023-11-07 DIAGNOSIS — J449 Chronic obstructive pulmonary disease, unspecified: Secondary | ICD-10-CM | POA: Diagnosis not present

## 2023-11-07 DIAGNOSIS — I081 Rheumatic disorders of both mitral and tricuspid valves: Secondary | ICD-10-CM | POA: Diagnosis not present

## 2023-11-07 DIAGNOSIS — J849 Interstitial pulmonary disease, unspecified: Secondary | ICD-10-CM | POA: Diagnosis not present

## 2023-11-07 DIAGNOSIS — I129 Hypertensive chronic kidney disease with stage 1 through stage 4 chronic kidney disease, or unspecified chronic kidney disease: Secondary | ICD-10-CM | POA: Diagnosis not present

## 2023-11-07 DIAGNOSIS — E669 Obesity, unspecified: Secondary | ICD-10-CM | POA: Diagnosis not present

## 2023-11-07 DIAGNOSIS — N1832 Chronic kidney disease, stage 3b: Secondary | ICD-10-CM | POA: Diagnosis not present

## 2023-11-07 DIAGNOSIS — R001 Bradycardia, unspecified: Secondary | ICD-10-CM | POA: Diagnosis not present

## 2023-11-10 DIAGNOSIS — Z7901 Long term (current) use of anticoagulants: Secondary | ICD-10-CM | POA: Diagnosis not present

## 2023-11-10 DIAGNOSIS — I482 Chronic atrial fibrillation, unspecified: Secondary | ICD-10-CM | POA: Diagnosis not present

## 2023-11-12 DIAGNOSIS — I129 Hypertensive chronic kidney disease with stage 1 through stage 4 chronic kidney disease, or unspecified chronic kidney disease: Secondary | ICD-10-CM | POA: Diagnosis not present

## 2023-11-12 DIAGNOSIS — I480 Paroxysmal atrial fibrillation: Secondary | ICD-10-CM | POA: Diagnosis not present

## 2023-11-12 DIAGNOSIS — F32A Depression, unspecified: Secondary | ICD-10-CM | POA: Diagnosis not present

## 2023-11-12 DIAGNOSIS — J449 Chronic obstructive pulmonary disease, unspecified: Secondary | ICD-10-CM | POA: Diagnosis not present

## 2023-11-12 DIAGNOSIS — N1832 Chronic kidney disease, stage 3b: Secondary | ICD-10-CM | POA: Diagnosis not present

## 2023-11-12 DIAGNOSIS — M1A0790 Idiopathic chronic gout, unspecified ankle and foot, without tophus (tophi): Secondary | ICD-10-CM | POA: Diagnosis not present

## 2023-11-12 DIAGNOSIS — J849 Interstitial pulmonary disease, unspecified: Secondary | ICD-10-CM | POA: Diagnosis not present

## 2023-11-19 DIAGNOSIS — G4733 Obstructive sleep apnea (adult) (pediatric): Secondary | ICD-10-CM | POA: Diagnosis not present

## 2023-12-04 DIAGNOSIS — M549 Dorsalgia, unspecified: Secondary | ICD-10-CM | POA: Diagnosis not present

## 2023-12-04 DIAGNOSIS — I10 Essential (primary) hypertension: Secondary | ICD-10-CM | POA: Diagnosis not present

## 2023-12-04 DIAGNOSIS — R0982 Postnasal drip: Secondary | ICD-10-CM | POA: Diagnosis not present

## 2023-12-04 DIAGNOSIS — J019 Acute sinusitis, unspecified: Secondary | ICD-10-CM | POA: Diagnosis not present

## 2023-12-04 DIAGNOSIS — M79622 Pain in left upper arm: Secondary | ICD-10-CM | POA: Diagnosis not present

## 2023-12-04 DIAGNOSIS — R059 Cough, unspecified: Secondary | ICD-10-CM | POA: Diagnosis not present

## 2023-12-04 DIAGNOSIS — N1832 Chronic kidney disease, stage 3b: Secondary | ICD-10-CM | POA: Diagnosis not present

## 2023-12-04 DIAGNOSIS — I482 Chronic atrial fibrillation, unspecified: Secondary | ICD-10-CM | POA: Diagnosis not present

## 2023-12-09 DIAGNOSIS — J449 Chronic obstructive pulmonary disease, unspecified: Secondary | ICD-10-CM | POA: Diagnosis not present

## 2023-12-09 DIAGNOSIS — M1A0790 Idiopathic chronic gout, unspecified ankle and foot, without tophus (tophi): Secondary | ICD-10-CM | POA: Diagnosis not present

## 2023-12-09 DIAGNOSIS — I480 Paroxysmal atrial fibrillation: Secondary | ICD-10-CM | POA: Diagnosis not present

## 2023-12-09 DIAGNOSIS — I081 Rheumatic disorders of both mitral and tricuspid valves: Secondary | ICD-10-CM | POA: Diagnosis not present

## 2023-12-09 DIAGNOSIS — F32A Depression, unspecified: Secondary | ICD-10-CM | POA: Diagnosis not present

## 2023-12-09 DIAGNOSIS — N1832 Chronic kidney disease, stage 3b: Secondary | ICD-10-CM | POA: Diagnosis not present

## 2023-12-09 DIAGNOSIS — I129 Hypertensive chronic kidney disease with stage 1 through stage 4 chronic kidney disease, or unspecified chronic kidney disease: Secondary | ICD-10-CM | POA: Diagnosis not present

## 2023-12-09 DIAGNOSIS — E669 Obesity, unspecified: Secondary | ICD-10-CM | POA: Diagnosis not present

## 2023-12-09 DIAGNOSIS — R001 Bradycardia, unspecified: Secondary | ICD-10-CM | POA: Diagnosis not present

## 2023-12-09 DIAGNOSIS — J849 Interstitial pulmonary disease, unspecified: Secondary | ICD-10-CM | POA: Diagnosis not present

## 2023-12-10 DIAGNOSIS — J849 Interstitial pulmonary disease, unspecified: Secondary | ICD-10-CM | POA: Diagnosis not present

## 2023-12-10 DIAGNOSIS — I482 Chronic atrial fibrillation, unspecified: Secondary | ICD-10-CM | POA: Diagnosis not present

## 2023-12-10 DIAGNOSIS — R0602 Shortness of breath: Secondary | ICD-10-CM | POA: Diagnosis not present

## 2023-12-11 DIAGNOSIS — W19XXXA Unspecified fall, initial encounter: Secondary | ICD-10-CM | POA: Diagnosis not present

## 2023-12-11 DIAGNOSIS — I469 Cardiac arrest, cause unspecified: Secondary | ICD-10-CM | POA: Diagnosis not present

## 2023-12-11 DIAGNOSIS — R404 Transient alteration of awareness: Secondary | ICD-10-CM | POA: Diagnosis not present

## 2023-12-11 DIAGNOSIS — R230 Cyanosis: Secondary | ICD-10-CM | POA: Diagnosis not present

## 2023-12-11 DIAGNOSIS — R58 Hemorrhage, not elsewhere classified: Secondary | ICD-10-CM | POA: Diagnosis not present

## 2024-01-07 DEATH — deceased

## 2024-02-12 ENCOUNTER — Ambulatory Visit: Admitting: Physician Assistant
